# Patient Record
Sex: Male | Born: 1960
Health system: Southern US, Community
[De-identification: ages and names within clinical notes are randomized; demographics above are authoritative.]

## PROBLEM LIST (undated history)

## (undated) DIAGNOSIS — E785 Hyperlipidemia, unspecified: Secondary | ICD-10-CM

## (undated) DIAGNOSIS — M51369 Other intervertebral disc degeneration, lumbar region without mention of lumbar back pain or lower extremity pain: Secondary | ICD-10-CM

## (undated) DIAGNOSIS — I10 Essential (primary) hypertension: Secondary | ICD-10-CM

## (undated) DIAGNOSIS — L409 Psoriasis, unspecified: Secondary | ICD-10-CM

## (undated) DIAGNOSIS — Z9189 Other specified personal risk factors, not elsewhere classified: Secondary | ICD-10-CM

## (undated) DIAGNOSIS — K5730 Diverticulosis of large intestine without perforation or abscess without bleeding: Secondary | ICD-10-CM

## (undated) DIAGNOSIS — N400 Enlarged prostate without lower urinary tract symptoms: Secondary | ICD-10-CM

## (undated) DIAGNOSIS — K219 Gastro-esophageal reflux disease without esophagitis: Secondary | ICD-10-CM

## (undated) DIAGNOSIS — Z972 Presence of dental prosthetic device (complete) (partial): Secondary | ICD-10-CM

## (undated) DIAGNOSIS — F32A Depression, unspecified: Secondary | ICD-10-CM

## (undated) DIAGNOSIS — M5136 Other intervertebral disc degeneration, lumbar region: Secondary | ICD-10-CM

## (undated) DIAGNOSIS — Z860101 Personal history of adenomatous and serrated colon polyps: Secondary | ICD-10-CM

## (undated) DIAGNOSIS — M5030 Other cervical disc degeneration, unspecified cervical region: Secondary | ICD-10-CM

## (undated) DIAGNOSIS — Z598 Other problems related to housing and economic circumstances: Secondary | ICD-10-CM

## (undated) DIAGNOSIS — R7303 Prediabetes: Secondary | ICD-10-CM

## (undated) DIAGNOSIS — M47814 Spondylosis without myelopathy or radiculopathy, thoracic region: Secondary | ICD-10-CM

## (undated) DIAGNOSIS — IMO0001 Reserved for inherently not codable concepts without codable children: Secondary | ICD-10-CM

## (undated) DIAGNOSIS — R3915 Urgency of urination: Secondary | ICD-10-CM

## (undated) DIAGNOSIS — Z86010 Personal history of colonic polyps: Secondary | ICD-10-CM

## (undated) DIAGNOSIS — M8580 Other specified disorders of bone density and structure, unspecified site: Secondary | ICD-10-CM

## (undated) DIAGNOSIS — R350 Frequency of micturition: Secondary | ICD-10-CM

## (undated) DIAGNOSIS — C801 Malignant (primary) neoplasm, unspecified: Secondary | ICD-10-CM

## (undated) DIAGNOSIS — F419 Anxiety disorder, unspecified: Secondary | ICD-10-CM

## (undated) DIAGNOSIS — F79 Unspecified intellectual disabilities: Secondary | ICD-10-CM

## (undated) DIAGNOSIS — Z832 Family history of diseases of the blood and blood-forming organs and certain disorders involving the immune mechanism: Secondary | ICD-10-CM

## (undated) DIAGNOSIS — M1990 Unspecified osteoarthritis, unspecified site: Secondary | ICD-10-CM

## (undated) HISTORY — DX: Depression, unspecified: F32.A

## (undated) HISTORY — DX: Malignant (primary) neoplasm, unspecified: C80.1

## (undated) HISTORY — PX: COLONOSCOPY: SHX174

## (undated) HISTORY — DX: Other specified disorders of bone density and structure, unspecified site: M85.80

## (undated) HISTORY — DX: Psoriasis, unspecified: L40.9

## (undated) HISTORY — DX: Gastro-esophageal reflux disease without esophagitis: K21.9

---

## 2004-02-08 HISTORY — PX: CATARACT EXTRACTION W/ INTRAOCULAR LENS  IMPLANT, BILATERAL: SHX1307

## 2004-10-21 ENCOUNTER — Ambulatory Visit: Payer: Self-pay | Admitting: Internal Medicine

## 2004-10-27 ENCOUNTER — Encounter (INDEPENDENT_AMBULATORY_CARE_PROVIDER_SITE_OTHER): Payer: Self-pay | Admitting: Specialist

## 2004-10-27 ENCOUNTER — Ambulatory Visit: Payer: Self-pay | Admitting: Internal Medicine

## 2004-10-27 DIAGNOSIS — Z860100 Personal history of colon polyps, unspecified: Secondary | ICD-10-CM | POA: Insufficient documentation

## 2005-03-22 ENCOUNTER — Ambulatory Visit (HOSPITAL_COMMUNITY): Admission: RE | Admit: 2005-03-22 | Discharge: 2005-03-22 | Payer: Self-pay | Admitting: Family Medicine

## 2007-03-26 ENCOUNTER — Encounter (INDEPENDENT_AMBULATORY_CARE_PROVIDER_SITE_OTHER): Payer: Self-pay | Admitting: Urology

## 2007-03-26 ENCOUNTER — Ambulatory Visit (HOSPITAL_COMMUNITY): Admission: RE | Admit: 2007-03-26 | Discharge: 2007-03-26 | Payer: Self-pay | Admitting: Urology

## 2007-03-29 HISTORY — PX: CIRCUMCISION: SUR203

## 2009-09-22 ENCOUNTER — Encounter (INDEPENDENT_AMBULATORY_CARE_PROVIDER_SITE_OTHER): Payer: Self-pay | Admitting: *Deleted

## 2009-10-21 ENCOUNTER — Encounter (INDEPENDENT_AMBULATORY_CARE_PROVIDER_SITE_OTHER): Payer: Self-pay | Admitting: *Deleted

## 2010-03-05 ENCOUNTER — Ambulatory Visit
Admission: RE | Admit: 2010-03-05 | Discharge: 2010-03-05 | Payer: Self-pay | Source: Home / Self Care | Attending: Internal Medicine | Admitting: Internal Medicine

## 2010-03-05 ENCOUNTER — Encounter (INDEPENDENT_AMBULATORY_CARE_PROVIDER_SITE_OTHER): Payer: Self-pay | Admitting: *Deleted

## 2010-03-09 NOTE — Letter (Signed)
Summary: Pre Visit Letter Revised  [facility]  [address] [place], [place] [address]   Phone: [phone]  Fax: [phone]        10/21/2009 MRN: [id number] Arthur Weaver [address] [place], [place]  [address]             Procedure Date: 12-08-09   Welcome to the Gastroenterology Division at [facility]    You are scheduled to see a nurse for your pre-procedure visit on 11-24-09 at 10:30a.m. on the 3rd floor at [facility], [address]  We ask that you try to arrive at our office 15 minutes prior to your appointment time to allow for check-in.  Please take a minute to review the attached form.  If you answer "Yes" to one or more of the questions on the first page, we ask that you call the person listed at your earliest opportunity.  If you answer "No" to all of the questions, please complete the rest of the form and bring it to your appointment.    Your nurse visit will consist of discussing your medical and surgical history, your immediate family medical history, and your medications.   If you are unable to list all of your medications on the form, please bring the medication bottles to your appointment and we will list them.  We will need to be aware of both prescribed and over the counter drugs.  We will need to know exact dosage information as well.    Please be prepared to read and sign documents such as consent forms, a financial agreement, and acknowledgement forms.  If necessary, and with your consent, a friend or relative is welcome to sit-in on the nurse visit with you.  Please bring your insurance card so that we may make a copy of it.  If your insurance requires a referral to see a specialist, please bring your referral form from your primary care physician.  No co-pay is required for this nurse visit.     If you cannot keep your appointment, please call [phone] to cancel or reschedule prior to your appointment date.  This allows  [place] the opportunity to schedule an appointment for another patient in need of care.    Thank you for choosing [facility] for your medical needs.  We appreciate the opportunity to care for you.  Please visit [place] at our website  to learn more about our practice.  Sincerely, The Gastroenterology Division

## 2010-03-09 NOTE — Letter (Signed)
Summary: Colonoscopy Letter  [facility]  [address] [place], [place] [address]   Phone: [phone]  Fax: [phone]      September 22, 2009 MRN: [id number]   ISAIA HASSELL [address] [place], [place]  [address]   Dear Mr. [facility],   According to your medical record, it is time for you to schedule a Colonoscopy. The American Cancer Society recommends this procedure as a method to detect early colon cancer. Patients with a family history of colon cancer, or a personal history of colon polyps or inflammatory bowel disease are at increased risk.  This letter has beeen generated based on the recommendations made at the time of your procedure. If you feel that in your particular situation this may no longer apply, please contact our office.  Please call our office at [phone] to schedule this appointment or to update your records at your earliest convenience.  Thank you for cooperating with [place] to provide you with the very best care possible.   Sincerely,   Iva Boop, M.D.  [facility] [phone]

## 2010-03-11 NOTE — Miscellaneous (Signed)
Summary: previsit prep  Clinical Lists Changes  Medications: Added new medication of MOVIPREP 100 GM  SOLR (PEG-KCL-NACL-NASULF-NA ASC-C) As per prep instructions. - Signed Rx of MOVIPREP 100 GM  SOLR (PEG-KCL-NACL-NASULF-NA ASC-C) As per prep instructions.;  #1 x 0;  Signed;  Entered by: Sherren Kerns RN;  Authorized by: Iva Boop MD, FACG;  Method used: Samples Given Allergies: Added new allergy or adverse reaction of SULFA Observations: Added new observation of ALLERGY REV: Done (03/05/2010 13:04) Added new observation of NKA: F (03/05/2010 13:04)    Prescriptions: MOVIPREP 100 GM  SOLR (PEG-KCL-NACL-NASULF-NA ASC-C) As per prep instructions.  #1 x 0   Entered by:   Sherren Kerns RN   Authorized by:   Iva Boop MD, [facility]   Signed by:   Sherren Kerns RN on 03/05/2010   Method used:   Samples Given   RxID:   [id number]

## 2010-03-11 NOTE — Letter (Signed)
Summary: [facility] Instructions  [facility]  [address] [place], [place] [address]   Phone: [phone]  Fax: [phone]       Arthur Weaver    [birth year]    MRN: [id number]        Procedure Day /Date: Thursday 03-18-10     Arrival Time: 8:00 am     Procedure Time: 9:00 AM     Location of Procedure:                    [name] (4th Floor)   PREPARATION FOR COLONOSCOPY WITH MOVIPREP   Starting 5 days prior to your procedure  03-13-10 do not eat nuts, seeds, popcorn, corn, beans, peas,  salads, or any raw vegetables.  Do not take any fiber supplements (e.g. Metamucil, Citrucel, and Benefiber).  THE DAY BEFORE YOUR PROCEDURE         DATE:  03-17-10  DAY:  Wednesday   1.  Drink clear liquids the entire day-NO SOLID FOOD  2.  Do not drink anything colored red or purple.  Avoid juices with pulp.  No orange juice.  3.  Drink at least 64 oz. (8 glasses) of fluid/clear liquids during the day to prevent dehydration and help the prep work efficiently.  CLEAR LIQUIDS INCLUDE: Water Jello Ice Popsicles Tea (sugar ok, no milk/cream) Powdered fruit flavored drinks Coffee (sugar ok, no milk/cream) Gatorade Juice: apple, white grape, white cranberry  Lemonade Clear bullion, consomm, broth Carbonated beverages (any kind) Strained chicken noodle soup Hard Candy                             4.  In the morning, mix first dose of MoviPrep solution:    Empty 1 Pouch A and 1 Pouch B into the disposable container    Add lukewarm drinking water to the top line of the container. Mix to dissolve    Refrigerate (mixed solution should be used within 24 hrs)  5.  Begin drinking the prep at 5:00 p.m. The MoviPrep container is divided by 4 marks.   Every 15 minutes drink the solution down to the next mark (approximately 8 oz) until the full liter is complete.   6.  Follow completed prep with 16 oz of clear liquid of your choice (Nothing red or purple).   Continue to drink clear liquids until bedtime.  7.  Before going to bed, mix second dose of MoviPrep solution:    Empty 1 Pouch A and 1 Pouch B into the disposable container    Add lukewarm drinking water to the top line of the container. Mix to dissolve    Refrigerate  THE DAY OF YOUR PROCEDURE      DATE:  03-18-10  DAY: Thursday  Beginning at  4:00 a.m. (5 hours before procedure):         1. Every 15 minutes, drink the solution down to the next mark (approx 8 oz) until the full liter is complete.  2. Follow completed prep with 16 oz. of clear liquid of your choice.    3. You may drink clear liquids until  7:00 a.m. (2 HOURS BEFORE PROCEDURE).   MEDICATION INSTRUCTIONS  Unless otherwise instructed, you should take regular prescription medications with a small sip of water   as early as possible the morning of your procedure.           OTHER  INSTRUCTIONS  You will need a responsible adult at least 52 years of age to accompany you and drive you home.   This person must remain in the waiting room during your procedure.  Wear loose fitting clothing that is easily removed.  Leave jewelry and other valuables at home.  However, you may wish to bring a book to read or  an iPod/MP3 player to listen to music as you wait for your procedure to start.  Remove all body piercing jewelry and leave at home.  Total time from sign-in until discharge is approximately 2-3 hours.  You should go home directly after your procedure and rest.  You can resume normal activities the  day after your procedure.  The day of your procedure you should not:   Drive   Make legal decisions   Operate machinery   Drink alcohol   Return to work  You will receive specific instructions about eating, activities and medications before you leave.    The above instructions have been reviewed and explained to me by  Sherren Kerns RN  March 05, 2010 1:38 PM    I fully understand and can  verbalize these instructions _____________________________ Date _________

## 2010-03-18 ENCOUNTER — Other Ambulatory Visit (AMBULATORY_SURGERY_CENTER): Payer: Medicare PPO | Admitting: Internal Medicine

## 2010-03-18 ENCOUNTER — Other Ambulatory Visit: Payer: Self-pay | Admitting: Internal Medicine

## 2010-03-18 DIAGNOSIS — Z860100 Personal history of colon polyps, unspecified: Secondary | ICD-10-CM

## 2010-03-18 DIAGNOSIS — D126 Benign neoplasm of colon, unspecified: Secondary | ICD-10-CM

## 2010-03-18 DIAGNOSIS — Z1211 Encounter for screening for malignant neoplasm of colon: Secondary | ICD-10-CM

## 2010-03-18 DIAGNOSIS — K5730 Diverticulosis of large intestine without perforation or abscess without bleeding: Secondary | ICD-10-CM

## 2010-03-24 ENCOUNTER — Encounter: Payer: Self-pay | Admitting: Internal Medicine

## 2010-03-25 NOTE — Procedures (Addendum)
Summary: Colonoscopy  Patient: Arthur Weaver Note: All result statuses are Final unless otherwise noted.  Tests: (1) Colonoscopy (COL)   COL Colonoscopy           DONE     [facility]     [address]     [place], [place]  [address]           COLONOSCOPY PROCEDURE REPORT           PATIENT:  [name], [name]  MR#:  [id number]     BIRTHDATE:  02/13/1960, 49 yrs. old  GENDER:  male     ENDOSCOPIST:  Iva Boop, MD, [facility]           PROCEDURE DATE:  03/18/2010     PROCEDURE:  Colonoscopy with snare polypectomy     ASA CLASS:  Class I     INDICATIONS:  surveillance and high-risk screening, history of     pre-cancerous (adenomatous) colon polyps 8 mm adenoma removed in     2006     MEDICATIONS:   Fentanyl 50 mcg IV, Versed 7 mg IV           DESCRIPTION OF PROCEDURE:   After the risks benefits and     alternatives of the procedure were thoroughly explained, informed     consent was obtained.  Digital rectal exam was performed and     revealed no abnormalities and normal prostate.   The LB CF-H180AL     [id number] endoscope was introduced through the anus and advanced to     the cecum, which was identified by both the appendix and ileocecal     valve, without limitations.  The quality of the prep was     excellent, using MoviPrep.  The instrument was then slowly     withdrawn as the colon was fully examined. Insertion: 2:51 minutes     Withdrawal: 13:38 minutes     <<PROCEDUREIMAGES>>           FINDINGS:  Two polyps were found one in the cecum and one in the     splenic flexure.They were diminutive. Polyps were snared without     cautery. Retrieval was successful. Scattered diverticula were     found throughout the colon.  This was otherwise a normal     examination of the colon. Includes right colon retroflexion.     Retroflexed views in the rectum revealed internal and external     hemorrhoids.    The scope was then withdrawn from the patient and     the procedure  completed.           COMPLICATIONS:  None     ENDOSCOPIC IMPRESSION:     1) Two diminutive polyps removed     2) Diverticula, scattered throughout the colon     3) Internal and external hemorrhoids     4) Otherwise normal examination, excellent prep     5) Personal history of adenoma removal 2006           REPEAT EXAM:  In for Colonoscopy, pending biopsy results.           Iva Boop, MD, [facility]           CC:  Charlott Rakes, MD     The Patient           n.     eSIGNED:   Iva Boop at 03/18/2010 09:51 AM  Tarell, Sch[place], [id number]  Note: An exclamation mark (!) indicates a result that was not dispersed into the flowsheet. Document Creation Date: 03/18/2010 9:51 AM _______________________________________________________________________  (1) Order result status: Final Collection or observation date-time: 03/18/2010 09:39 Requested date-time:  Receipt date-time:  Reported date-time:  Referring Physician:   Ordering Physician: Stan Head [id number]) Specimen Source:  Source: Launa Grill Order Number: [id number] Lab site:   Appended Document: Colonoscopy     Procedures Next Due Date:    Colonoscopy: 03/2015

## 2010-03-31 NOTE — Letter (Signed)
Summary: Patient Notice- Polyp Results  [facility]  [address] [place], [place] [address]   Phone: [phone]  Fax: [phone]        March 24, 2010 MRN: [id number]    Arthur Weaver [address] [place], [place]  [address]    Dear Mr. Buist,  The polyps removed from your colon were adenomatous. This means that they were pre-cancerous or that  they had the potential to change into cancer over time.  I recommend that you have a repeat colonoscopy in 5 years to determine if you have developed any new polyps over time and screen for colorectal cancer. If you develop any new rectal bleeding, abdominal pain or significant bowel habit changes, please contact us before then.  In addition to repeating colonoscopy, changing health habits may reduce your risk of having more colon or rectal  polyps and possibly, colorectal cancer. You may lower your risk of future polyps and colorectal cancer by adopting healthy habits such as not smoking or using tobacco (if you do), being physically active, losing weight (if overweight), and eating a diet which includes fruits and vegetables and limits red meat.  Please call us if you are having persistent problems or have questions about your condition that have not been fully answered at this time.  Sincerely,  Iva Boop MD, [facility]  This letter has been electronically signed by your physician.  Appended Document: Patient Notice- Polyp Results letter mailed

## 2010-04-14 ENCOUNTER — Other Ambulatory Visit: Payer: Self-pay | Admitting: General Surgery

## 2010-06-22 NOTE — Op Note (Signed)
NAME:  Arthur Weaver, Arthur Weaver NO.:  [id number]   MEDICAL RECORD NO.:  [id number]          PATIENT TYPE:  AMB   LOCATION:  DAY                          FACILITY:  [facility]   PHYSICIAN:  Sigmund I. Patsi Sears, M.D.DATE OF BIRTH:  1960/09/16   DATE OF PROCEDURE:  03/26/2007  DATE OF DISCHARGE:                               OPERATIVE REPORT   PREOPERATIVE DIAGNOSES:  1. Prostate nodule.  2. Severe phimosis.   OPERATION:  1. Prostate ultrasound and prostate biopsy.  2. Circumcision.  3. Flexible cystoscopy.   SURGEON:  Sigmund I. Patsi Sears, M.D.   ANESTHESIA:  General, LMA anesthesia.   PREPARATION/DESCRIPTION OF PROCEDURE:  After appropriate per-anesthesia,  the patient was brought to the operating room and placed on the  operating room table in the dorsal supine position where general LMA  anesthesia was introduced.  He was then placed in the left lateral  decubitus position where the prostate ultrasound probe was placed.  The  prostate ultrasound to 23.5 mL.  The patient's PSA is 0.1.  He has known  prostate nodule, for biopsy.   A rectal examination again revealed a firm gritty prostate with  bilateral nodules.  Multiple cores of tissue were taken from the  prostate without difficulty and no bleeding was noted.  The patient was  replaced in the dorsal supine position where the penis was prepped with  Betadine solution and draped in the usual fashion.  Because of severe  phimosis, the urethral meatus cannot be identified, and the foreskin  cannot be brought behind the glans.  Therefore a dorsal slit  circumcision is accomplished using scissors and cutting in the 12  o'clock position.  Circumferential incisions were then made in the  periglandular and pericoronal areas, after marking them with a marking  pen.  Marcaine 0.5% plain is used circumferentially at the base of the  penis, under the pubis, to afford a penile block.   Following this, the foreskin is removed  and subcutaneous tissue  dissected with the electrosurgical unit.  Four separate quadrant sutures  of #4-0 Vicryl suture were then accomplished, and each quadrant was  closed with interrupted #4-0 Vicryl suture.  Sterile dressing was  applied.  A cystoscopy reveals normal-appearing bladder with  slightly elevated bladder neck and prostatic hypertrophy.  There was  trabeculation noted but cellules were identified.  There was no  diverticula or stones identified or bladder tumor seen.  The bladder was  drained with a #14 red Robinson catheter.  The patient was given IV  Toradol, awakened and taken to the recovery room in good condition.      Sigmund I. Patsi Sears, M.D.  Electronically Signed     SIT/MEDQ  D:  03/26/2007  T:  03/27/2007  Job:  [id number]

## 2010-09-07 ENCOUNTER — Encounter: Payer: Self-pay | Admitting: Podiatry

## 2010-09-07 DIAGNOSIS — E119 Type 2 diabetes mellitus without complications: Secondary | ICD-10-CM | POA: Insufficient documentation

## 2010-09-07 DIAGNOSIS — M7989 Other specified soft tissue disorders: Secondary | ICD-10-CM | POA: Insufficient documentation

## 2010-09-07 DIAGNOSIS — M069 Rheumatoid arthritis, unspecified: Secondary | ICD-10-CM | POA: Insufficient documentation

## 2010-09-07 DIAGNOSIS — I10 Essential (primary) hypertension: Secondary | ICD-10-CM | POA: Insufficient documentation

## 2010-09-07 DIAGNOSIS — E7800 Pure hypercholesterolemia, unspecified: Secondary | ICD-10-CM | POA: Insufficient documentation

## 2010-10-29 LAB — BASIC METABOLIC PANEL
Chloride: 103
GFR calc Af Amer: >60
GFR calc non Af Amer: >60
Potassium: 3.8

## 2010-10-29 LAB — URINALYSIS, ROUTINE W REFLEX MICROSCOPIC
Glucose, UA: NEGATIVE
Nitrite: NEGATIVE

## 2010-10-29 LAB — HEMOGLOBIN AND HEMATOCRIT, BLOOD
HCT: 39.9
Hemoglobin: 14

## 2012-12-26 ENCOUNTER — Ambulatory Visit (INDEPENDENT_AMBULATORY_CARE_PROVIDER_SITE_OTHER): Payer: Medicare PPO | Admitting: Podiatry

## 2012-12-26 ENCOUNTER — Encounter: Payer: Self-pay | Admitting: Podiatry

## 2012-12-26 VITALS — BP 159/88 | HR 84 | Resp 16

## 2012-12-26 DIAGNOSIS — B351 Tinea unguium: Secondary | ICD-10-CM

## 2012-12-26 DIAGNOSIS — M79609 Pain in unspecified limb: Secondary | ICD-10-CM

## 2012-12-26 DIAGNOSIS — Q828 Other specified congenital malformations of skin: Secondary | ICD-10-CM

## 2012-12-26 NOTE — Progress Notes (Signed)
Subjective:     Patient ID: Arthur Weaver, male   DOB: [birth year], 52 y.o.   MRN: [id number]  HPI patient presents with caregiver complaining of thick painful  nailbeds 1-5 both feet and calluses on both feet which are painful and he cannot take care of himself   Review of Systems     Objective:   Physical Exam Neurovascular status unchanged with significant thickness of nailbeds 1-5 and pain and keratotic lesions on the right and left hallux second and third toe both feet that are thick with nucleated tight quarters    Assessment:     Diabetic with nail disease and lesions with at risk condition    Plan:     Debridement of nailbeds 1-5 both feet and debridement of lesions on both feet with no iatrogenic bleeding noted

## 2013-03-18 ENCOUNTER — Ambulatory Visit: Payer: Medicare PPO | Admitting: Podiatry

## 2013-04-04 ENCOUNTER — Ambulatory Visit: Payer: Medicare PPO | Admitting: Podiatry

## 2013-04-29 ENCOUNTER — Encounter: Payer: Self-pay | Admitting: Podiatry

## 2013-04-29 ENCOUNTER — Ambulatory Visit (INDEPENDENT_AMBULATORY_CARE_PROVIDER_SITE_OTHER): Payer: Medicare PPO | Admitting: Podiatry

## 2013-04-29 VITALS — BP 131/86 | HR 100 | Resp 12

## 2013-04-29 DIAGNOSIS — M79609 Pain in unspecified limb: Secondary | ICD-10-CM

## 2013-04-29 DIAGNOSIS — B351 Tinea unguium: Secondary | ICD-10-CM

## 2013-04-29 DIAGNOSIS — L84 Corns and callosities: Secondary | ICD-10-CM

## 2013-04-30 NOTE — Progress Notes (Signed)
Subjective:     Patient ID: Arthur Weaver, male   DOB: Aug 14, 1960, 53 y.o.   MRN: [id number]  HPI patient presents with caregiver with nail disease 1-5 both feet that are painful and cannot be cut by the patient and lesion on the right foot and painful   Review of Systems     Objective:   Physical Exam Neurovascular status unchanged with thick painful nailbeds 1-5 both feet and keratotic lesion distal third toe right it's painful    Assessment:     Mycotic nail infection with pain and lesion formation right third toe    Plan:     Debridement painful nailbeds 1-5 both feet and lesion on the right foot

## 2013-05-28 DIAGNOSIS — E559 Vitamin D deficiency, unspecified: Secondary | ICD-10-CM | POA: Insufficient documentation

## 2013-05-28 DIAGNOSIS — K219 Gastro-esophageal reflux disease without esophagitis: Secondary | ICD-10-CM | POA: Insufficient documentation

## 2013-06-03 ENCOUNTER — Telehealth: Payer: Self-pay | Admitting: Internal Medicine

## 2013-06-03 NOTE — Telephone Encounter (Signed)
Rec'd from [place] forward 2 pages to Dr.Gessner

## 2013-08-01 ENCOUNTER — Ambulatory Visit (INDEPENDENT_AMBULATORY_CARE_PROVIDER_SITE_OTHER): Payer: Medicare PPO | Admitting: Podiatry

## 2013-08-01 ENCOUNTER — Encounter: Payer: Self-pay | Admitting: Podiatry

## 2013-08-01 DIAGNOSIS — B351 Tinea unguium: Secondary | ICD-10-CM

## 2013-08-01 DIAGNOSIS — L84 Corns and callosities: Secondary | ICD-10-CM

## 2013-08-01 DIAGNOSIS — M79673 Pain in unspecified foot: Secondary | ICD-10-CM

## 2013-08-01 DIAGNOSIS — M79609 Pain in unspecified limb: Secondary | ICD-10-CM

## 2013-08-01 NOTE — Progress Notes (Signed)
Subjective:     Patient ID: Arthur Weaver, male   DOB: [birth year], 53 y.o.   MRN: [id number]  HPI patient presents with thick nailbeds 1-5 both feet that he cannot cut and lesions on both feet that become tender when pressed   Review of Systems     Objective:   Physical Exam Neurovascular status intact with thick brittle nailbeds 1-5 both feet that are painful and he cannot cut and keratotic lesions on the plantar aspect of both first metatarsal    Assessment:     Nail disease with mycosis 1-5 both feet and lesions of both feet    Plan:     Debridement nailbeds 1-5 both feet with no iatrogenic bleeding and debridement lesions on both feet with no bleeding

## 2014-02-07 DIAGNOSIS — F71 Moderate intellectual disabilities: Secondary | ICD-10-CM | POA: Diagnosis not present

## 2014-02-08 DIAGNOSIS — F71 Moderate intellectual disabilities: Secondary | ICD-10-CM | POA: Diagnosis not present

## 2014-02-09 DIAGNOSIS — F71 Moderate intellectual disabilities: Secondary | ICD-10-CM | POA: Diagnosis not present

## 2014-02-10 DIAGNOSIS — F71 Moderate intellectual disabilities: Secondary | ICD-10-CM | POA: Diagnosis not present

## 2014-02-11 DIAGNOSIS — F71 Moderate intellectual disabilities: Secondary | ICD-10-CM | POA: Diagnosis not present

## 2014-02-12 DIAGNOSIS — F71 Moderate intellectual disabilities: Secondary | ICD-10-CM | POA: Diagnosis not present

## 2014-02-12 DIAGNOSIS — H26493 Other secondary cataract, bilateral: Secondary | ICD-10-CM | POA: Diagnosis not present

## 2014-02-13 DIAGNOSIS — F71 Moderate intellectual disabilities: Secondary | ICD-10-CM | POA: Diagnosis not present

## 2014-02-14 DIAGNOSIS — F71 Moderate intellectual disabilities: Secondary | ICD-10-CM | POA: Diagnosis not present

## 2014-02-15 DIAGNOSIS — F71 Moderate intellectual disabilities: Secondary | ICD-10-CM | POA: Diagnosis not present

## 2014-02-16 DIAGNOSIS — F71 Moderate intellectual disabilities: Secondary | ICD-10-CM | POA: Diagnosis not present

## 2014-02-17 DIAGNOSIS — F71 Moderate intellectual disabilities: Secondary | ICD-10-CM | POA: Diagnosis not present

## 2014-02-18 DIAGNOSIS — F71 Moderate intellectual disabilities: Secondary | ICD-10-CM | POA: Diagnosis not present

## 2014-02-19 DIAGNOSIS — F71 Moderate intellectual disabilities: Secondary | ICD-10-CM | POA: Diagnosis not present

## 2014-02-20 DIAGNOSIS — F71 Moderate intellectual disabilities: Secondary | ICD-10-CM | POA: Diagnosis not present

## 2014-02-21 DIAGNOSIS — F71 Moderate intellectual disabilities: Secondary | ICD-10-CM | POA: Diagnosis not present

## 2014-02-22 DIAGNOSIS — F71 Moderate intellectual disabilities: Secondary | ICD-10-CM | POA: Diagnosis not present

## 2014-02-23 DIAGNOSIS — F71 Moderate intellectual disabilities: Secondary | ICD-10-CM | POA: Diagnosis not present

## 2014-02-24 DIAGNOSIS — F71 Moderate intellectual disabilities: Secondary | ICD-10-CM | POA: Diagnosis not present

## 2014-02-25 DIAGNOSIS — F71 Moderate intellectual disabilities: Secondary | ICD-10-CM | POA: Diagnosis not present

## 2014-02-26 DIAGNOSIS — F71 Moderate intellectual disabilities: Secondary | ICD-10-CM | POA: Diagnosis not present

## 2014-02-27 DIAGNOSIS — F71 Moderate intellectual disabilities: Secondary | ICD-10-CM | POA: Diagnosis not present

## 2014-02-28 DIAGNOSIS — F71 Moderate intellectual disabilities: Secondary | ICD-10-CM | POA: Diagnosis not present

## 2014-03-01 DIAGNOSIS — F71 Moderate intellectual disabilities: Secondary | ICD-10-CM | POA: Diagnosis not present

## 2014-03-02 DIAGNOSIS — F71 Moderate intellectual disabilities: Secondary | ICD-10-CM | POA: Diagnosis not present

## 2014-03-03 DIAGNOSIS — F71 Moderate intellectual disabilities: Secondary | ICD-10-CM | POA: Diagnosis not present

## 2014-03-04 DIAGNOSIS — F71 Moderate intellectual disabilities: Secondary | ICD-10-CM | POA: Diagnosis not present

## 2014-03-05 DIAGNOSIS — F71 Moderate intellectual disabilities: Secondary | ICD-10-CM | POA: Diagnosis not present

## 2014-03-06 DIAGNOSIS — F71 Moderate intellectual disabilities: Secondary | ICD-10-CM | POA: Diagnosis not present

## 2014-03-07 DIAGNOSIS — F71 Moderate intellectual disabilities: Secondary | ICD-10-CM | POA: Diagnosis not present

## 2014-03-08 DIAGNOSIS — F71 Moderate intellectual disabilities: Secondary | ICD-10-CM | POA: Diagnosis not present

## 2014-03-09 DIAGNOSIS — F71 Moderate intellectual disabilities: Secondary | ICD-10-CM | POA: Diagnosis not present

## 2014-03-10 DIAGNOSIS — F71 Moderate intellectual disabilities: Secondary | ICD-10-CM | POA: Diagnosis not present

## 2014-03-11 DIAGNOSIS — F71 Moderate intellectual disabilities: Secondary | ICD-10-CM | POA: Diagnosis not present

## 2014-03-12 DIAGNOSIS — F71 Moderate intellectual disabilities: Secondary | ICD-10-CM | POA: Diagnosis not present

## 2014-03-13 DIAGNOSIS — F71 Moderate intellectual disabilities: Secondary | ICD-10-CM | POA: Diagnosis not present

## 2014-03-14 DIAGNOSIS — F71 Moderate intellectual disabilities: Secondary | ICD-10-CM | POA: Diagnosis not present

## 2014-03-15 DIAGNOSIS — F71 Moderate intellectual disabilities: Secondary | ICD-10-CM | POA: Diagnosis not present

## 2014-03-16 DIAGNOSIS — F71 Moderate intellectual disabilities: Secondary | ICD-10-CM | POA: Diagnosis not present

## 2014-03-17 DIAGNOSIS — F71 Moderate intellectual disabilities: Secondary | ICD-10-CM | POA: Diagnosis not present

## 2014-03-18 DIAGNOSIS — F71 Moderate intellectual disabilities: Secondary | ICD-10-CM | POA: Diagnosis not present

## 2014-03-19 DIAGNOSIS — F71 Moderate intellectual disabilities: Secondary | ICD-10-CM | POA: Diagnosis not present

## 2014-03-20 DIAGNOSIS — F71 Moderate intellectual disabilities: Secondary | ICD-10-CM | POA: Diagnosis not present

## 2014-03-21 DIAGNOSIS — F71 Moderate intellectual disabilities: Secondary | ICD-10-CM | POA: Diagnosis not present

## 2014-03-22 DIAGNOSIS — F71 Moderate intellectual disabilities: Secondary | ICD-10-CM | POA: Diagnosis not present

## 2014-03-23 DIAGNOSIS — F71 Moderate intellectual disabilities: Secondary | ICD-10-CM | POA: Diagnosis not present

## 2014-03-24 DIAGNOSIS — F71 Moderate intellectual disabilities: Secondary | ICD-10-CM | POA: Diagnosis not present

## 2014-03-25 DIAGNOSIS — F71 Moderate intellectual disabilities: Secondary | ICD-10-CM | POA: Diagnosis not present

## 2014-03-26 DIAGNOSIS — F71 Moderate intellectual disabilities: Secondary | ICD-10-CM | POA: Diagnosis not present

## 2014-03-27 DIAGNOSIS — F71 Moderate intellectual disabilities: Secondary | ICD-10-CM | POA: Diagnosis not present

## 2014-03-28 DIAGNOSIS — F71 Moderate intellectual disabilities: Secondary | ICD-10-CM | POA: Diagnosis not present

## 2014-03-29 DIAGNOSIS — F71 Moderate intellectual disabilities: Secondary | ICD-10-CM | POA: Diagnosis not present

## 2014-03-30 DIAGNOSIS — F71 Moderate intellectual disabilities: Secondary | ICD-10-CM | POA: Diagnosis not present

## 2014-03-31 DIAGNOSIS — F71 Moderate intellectual disabilities: Secondary | ICD-10-CM | POA: Diagnosis not present

## 2014-04-01 DIAGNOSIS — F71 Moderate intellectual disabilities: Secondary | ICD-10-CM | POA: Diagnosis not present

## 2014-04-02 DIAGNOSIS — F71 Moderate intellectual disabilities: Secondary | ICD-10-CM | POA: Diagnosis not present

## 2014-04-03 DIAGNOSIS — F71 Moderate intellectual disabilities: Secondary | ICD-10-CM | POA: Diagnosis not present

## 2014-04-04 DIAGNOSIS — N429 Disorder of prostate, unspecified: Secondary | ICD-10-CM | POA: Diagnosis not present

## 2014-04-04 DIAGNOSIS — F71 Moderate intellectual disabilities: Secondary | ICD-10-CM | POA: Diagnosis not present

## 2014-04-04 DIAGNOSIS — Z79899 Other long term (current) drug therapy: Secondary | ICD-10-CM | POA: Diagnosis not present

## 2014-04-05 DIAGNOSIS — F71 Moderate intellectual disabilities: Secondary | ICD-10-CM | POA: Diagnosis not present

## 2014-04-06 DIAGNOSIS — F71 Moderate intellectual disabilities: Secondary | ICD-10-CM | POA: Diagnosis not present

## 2014-04-07 DIAGNOSIS — F71 Moderate intellectual disabilities: Secondary | ICD-10-CM | POA: Diagnosis not present

## 2014-04-08 DIAGNOSIS — F71 Moderate intellectual disabilities: Secondary | ICD-10-CM | POA: Diagnosis not present

## 2014-04-09 DIAGNOSIS — F71 Moderate intellectual disabilities: Secondary | ICD-10-CM | POA: Diagnosis not present

## 2014-04-10 DIAGNOSIS — F71 Moderate intellectual disabilities: Secondary | ICD-10-CM | POA: Diagnosis not present

## 2014-04-11 DIAGNOSIS — F71 Moderate intellectual disabilities: Secondary | ICD-10-CM | POA: Diagnosis not present

## 2014-04-12 DIAGNOSIS — F71 Moderate intellectual disabilities: Secondary | ICD-10-CM | POA: Diagnosis not present

## 2014-04-13 DIAGNOSIS — F71 Moderate intellectual disabilities: Secondary | ICD-10-CM | POA: Diagnosis not present

## 2014-04-14 DIAGNOSIS — F71 Moderate intellectual disabilities: Secondary | ICD-10-CM | POA: Diagnosis not present

## 2014-04-15 DIAGNOSIS — F71 Moderate intellectual disabilities: Secondary | ICD-10-CM | POA: Diagnosis not present

## 2014-04-16 DIAGNOSIS — F71 Moderate intellectual disabilities: Secondary | ICD-10-CM | POA: Diagnosis not present

## 2014-04-17 DIAGNOSIS — F71 Moderate intellectual disabilities: Secondary | ICD-10-CM | POA: Diagnosis not present

## 2014-04-18 DIAGNOSIS — F71 Moderate intellectual disabilities: Secondary | ICD-10-CM | POA: Diagnosis not present

## 2014-04-19 DIAGNOSIS — F71 Moderate intellectual disabilities: Secondary | ICD-10-CM | POA: Diagnosis not present

## 2014-04-20 DIAGNOSIS — F71 Moderate intellectual disabilities: Secondary | ICD-10-CM | POA: Diagnosis not present

## 2014-04-21 DIAGNOSIS — F71 Moderate intellectual disabilities: Secondary | ICD-10-CM | POA: Diagnosis not present

## 2014-04-22 DIAGNOSIS — F71 Moderate intellectual disabilities: Secondary | ICD-10-CM | POA: Diagnosis not present

## 2014-04-23 DIAGNOSIS — F71 Moderate intellectual disabilities: Secondary | ICD-10-CM | POA: Diagnosis not present

## 2014-04-24 DIAGNOSIS — F71 Moderate intellectual disabilities: Secondary | ICD-10-CM | POA: Diagnosis not present

## 2014-04-25 DIAGNOSIS — F71 Moderate intellectual disabilities: Secondary | ICD-10-CM | POA: Diagnosis not present

## 2014-04-26 DIAGNOSIS — F71 Moderate intellectual disabilities: Secondary | ICD-10-CM | POA: Diagnosis not present

## 2014-04-27 DIAGNOSIS — F71 Moderate intellectual disabilities: Secondary | ICD-10-CM | POA: Diagnosis not present

## 2014-04-28 DIAGNOSIS — F71 Moderate intellectual disabilities: Secondary | ICD-10-CM | POA: Diagnosis not present

## 2014-04-29 DIAGNOSIS — F71 Moderate intellectual disabilities: Secondary | ICD-10-CM | POA: Diagnosis not present

## 2014-04-30 DIAGNOSIS — F71 Moderate intellectual disabilities: Secondary | ICD-10-CM | POA: Diagnosis not present

## 2014-05-01 DIAGNOSIS — Z79899 Other long term (current) drug therapy: Secondary | ICD-10-CM | POA: Diagnosis not present

## 2014-05-01 DIAGNOSIS — I10 Essential (primary) hypertension: Secondary | ICD-10-CM | POA: Diagnosis not present

## 2014-05-01 DIAGNOSIS — F71 Moderate intellectual disabilities: Secondary | ICD-10-CM | POA: Diagnosis not present

## 2014-05-01 DIAGNOSIS — F79 Unspecified intellectual disabilities: Secondary | ICD-10-CM | POA: Diagnosis not present

## 2014-05-02 DIAGNOSIS — F71 Moderate intellectual disabilities: Secondary | ICD-10-CM | POA: Diagnosis not present

## 2014-05-03 DIAGNOSIS — F71 Moderate intellectual disabilities: Secondary | ICD-10-CM | POA: Diagnosis not present

## 2014-05-04 DIAGNOSIS — F71 Moderate intellectual disabilities: Secondary | ICD-10-CM | POA: Diagnosis not present

## 2014-05-05 DIAGNOSIS — F71 Moderate intellectual disabilities: Secondary | ICD-10-CM | POA: Diagnosis not present

## 2014-05-06 DIAGNOSIS — F71 Moderate intellectual disabilities: Secondary | ICD-10-CM | POA: Diagnosis not present

## 2014-05-07 DIAGNOSIS — F71 Moderate intellectual disabilities: Secondary | ICD-10-CM | POA: Diagnosis not present

## 2014-05-08 DIAGNOSIS — F71 Moderate intellectual disabilities: Secondary | ICD-10-CM | POA: Diagnosis not present

## 2014-05-24 DIAGNOSIS — F71 Moderate intellectual disabilities: Secondary | ICD-10-CM | POA: Diagnosis not present

## 2014-05-25 DIAGNOSIS — F71 Moderate intellectual disabilities: Secondary | ICD-10-CM | POA: Diagnosis not present

## 2014-05-26 DIAGNOSIS — F71 Moderate intellectual disabilities: Secondary | ICD-10-CM | POA: Diagnosis not present

## 2014-05-27 DIAGNOSIS — F71 Moderate intellectual disabilities: Secondary | ICD-10-CM | POA: Diagnosis not present

## 2014-05-28 DIAGNOSIS — F71 Moderate intellectual disabilities: Secondary | ICD-10-CM | POA: Diagnosis not present

## 2014-05-29 DIAGNOSIS — F71 Moderate intellectual disabilities: Secondary | ICD-10-CM | POA: Diagnosis not present

## 2014-05-30 DIAGNOSIS — F71 Moderate intellectual disabilities: Secondary | ICD-10-CM | POA: Diagnosis not present

## 2014-05-31 DIAGNOSIS — F71 Moderate intellectual disabilities: Secondary | ICD-10-CM | POA: Diagnosis not present

## 2014-06-01 DIAGNOSIS — F71 Moderate intellectual disabilities: Secondary | ICD-10-CM | POA: Diagnosis not present

## 2014-06-02 ENCOUNTER — Ambulatory Visit (INDEPENDENT_AMBULATORY_CARE_PROVIDER_SITE_OTHER): Payer: Commercial Managed Care - HMO | Admitting: Podiatry

## 2014-06-02 ENCOUNTER — Encounter: Payer: Self-pay | Admitting: Podiatry

## 2014-06-02 VITALS — BP 146/94 | HR 84 | Resp 18

## 2014-06-02 DIAGNOSIS — B351 Tinea unguium: Secondary | ICD-10-CM

## 2014-06-02 DIAGNOSIS — Q828 Other specified congenital malformations of skin: Secondary | ICD-10-CM | POA: Diagnosis not present

## 2014-06-02 DIAGNOSIS — E1151 Type 2 diabetes mellitus with diabetic peripheral angiopathy without gangrene: Secondary | ICD-10-CM | POA: Diagnosis not present

## 2014-06-02 DIAGNOSIS — F71 Moderate intellectual disabilities: Secondary | ICD-10-CM | POA: Diagnosis not present

## 2014-06-02 DIAGNOSIS — M79673 Pain in unspecified foot: Secondary | ICD-10-CM | POA: Diagnosis not present

## 2014-06-03 DIAGNOSIS — F71 Moderate intellectual disabilities: Secondary | ICD-10-CM | POA: Diagnosis not present

## 2014-06-03 NOTE — Progress Notes (Signed)
Subjective:     Patient ID: Arthur Weaver, male   DOB: 15-Feb-1960, 54 y.o.   MRN: [id number]  HPI patient presents with lesions on both feet the become painful nail disease and long-term diabetes with obesity and diminished circulatory and neurological status   Review of Systems     Objective:   Physical Exam Neurovascular status diminished but intact with diminished pulses PT DP diminished hair growth shiny type skin along with lesions on the hallux bilateral that are very painful when pressed and making it hard to walk and nail disease 1-5 both feet with thickness yellow debris and pain    Assessment:     I chronic nail infection bilateral with lesion formation and at risk diabetes    Plan:     Reviewed condition and at this time debrided nailbeds 1-5 both feet debrided lesions on both feet and we will go ahead and have him approved for new pair diabetic shoes due to his risk conditions

## 2014-06-04 DIAGNOSIS — F71 Moderate intellectual disabilities: Secondary | ICD-10-CM | POA: Diagnosis not present

## 2014-06-05 DIAGNOSIS — F71 Moderate intellectual disabilities: Secondary | ICD-10-CM | POA: Diagnosis not present

## 2014-06-06 DIAGNOSIS — F71 Moderate intellectual disabilities: Secondary | ICD-10-CM | POA: Diagnosis not present

## 2014-06-07 DIAGNOSIS — F71 Moderate intellectual disabilities: Secondary | ICD-10-CM | POA: Diagnosis not present

## 2014-06-08 DIAGNOSIS — F71 Moderate intellectual disabilities: Secondary | ICD-10-CM | POA: Diagnosis not present

## 2014-06-09 DIAGNOSIS — F71 Moderate intellectual disabilities: Secondary | ICD-10-CM | POA: Diagnosis not present

## 2014-06-10 DIAGNOSIS — F71 Moderate intellectual disabilities: Secondary | ICD-10-CM | POA: Diagnosis not present

## 2014-06-11 DIAGNOSIS — F71 Moderate intellectual disabilities: Secondary | ICD-10-CM | POA: Diagnosis not present

## 2014-06-12 DIAGNOSIS — F71 Moderate intellectual disabilities: Secondary | ICD-10-CM | POA: Diagnosis not present

## 2014-06-13 DIAGNOSIS — F71 Moderate intellectual disabilities: Secondary | ICD-10-CM | POA: Diagnosis not present

## 2014-06-14 DIAGNOSIS — F71 Moderate intellectual disabilities: Secondary | ICD-10-CM | POA: Diagnosis not present

## 2014-06-15 DIAGNOSIS — F71 Moderate intellectual disabilities: Secondary | ICD-10-CM | POA: Diagnosis not present

## 2014-06-16 DIAGNOSIS — F71 Moderate intellectual disabilities: Secondary | ICD-10-CM | POA: Diagnosis not present

## 2014-06-17 DIAGNOSIS — F71 Moderate intellectual disabilities: Secondary | ICD-10-CM | POA: Diagnosis not present

## 2014-06-18 DIAGNOSIS — F71 Moderate intellectual disabilities: Secondary | ICD-10-CM | POA: Diagnosis not present

## 2014-06-19 DIAGNOSIS — F71 Moderate intellectual disabilities: Secondary | ICD-10-CM | POA: Diagnosis not present

## 2014-06-20 DIAGNOSIS — F71 Moderate intellectual disabilities: Secondary | ICD-10-CM | POA: Diagnosis not present

## 2014-06-21 DIAGNOSIS — F71 Moderate intellectual disabilities: Secondary | ICD-10-CM | POA: Diagnosis not present

## 2014-06-22 DIAGNOSIS — F71 Moderate intellectual disabilities: Secondary | ICD-10-CM | POA: Diagnosis not present

## 2014-06-23 DIAGNOSIS — F71 Moderate intellectual disabilities: Secondary | ICD-10-CM | POA: Diagnosis not present

## 2014-06-24 DIAGNOSIS — F71 Moderate intellectual disabilities: Secondary | ICD-10-CM | POA: Diagnosis not present

## 2014-06-25 DIAGNOSIS — F71 Moderate intellectual disabilities: Secondary | ICD-10-CM | POA: Diagnosis not present

## 2014-06-26 DIAGNOSIS — F71 Moderate intellectual disabilities: Secondary | ICD-10-CM | POA: Diagnosis not present

## 2014-06-27 DIAGNOSIS — F71 Moderate intellectual disabilities: Secondary | ICD-10-CM | POA: Diagnosis not present

## 2014-06-28 DIAGNOSIS — F71 Moderate intellectual disabilities: Secondary | ICD-10-CM | POA: Diagnosis not present

## 2014-06-29 DIAGNOSIS — F71 Moderate intellectual disabilities: Secondary | ICD-10-CM | POA: Diagnosis not present

## 2014-06-30 DIAGNOSIS — F71 Moderate intellectual disabilities: Secondary | ICD-10-CM | POA: Diagnosis not present

## 2014-07-01 DIAGNOSIS — F71 Moderate intellectual disabilities: Secondary | ICD-10-CM | POA: Diagnosis not present

## 2014-07-02 DIAGNOSIS — F71 Moderate intellectual disabilities: Secondary | ICD-10-CM | POA: Diagnosis not present

## 2014-07-03 DIAGNOSIS — F71 Moderate intellectual disabilities: Secondary | ICD-10-CM | POA: Diagnosis not present

## 2014-07-04 DIAGNOSIS — F71 Moderate intellectual disabilities: Secondary | ICD-10-CM | POA: Diagnosis not present

## 2014-07-05 DIAGNOSIS — F71 Moderate intellectual disabilities: Secondary | ICD-10-CM | POA: Diagnosis not present

## 2014-07-06 DIAGNOSIS — F71 Moderate intellectual disabilities: Secondary | ICD-10-CM | POA: Diagnosis not present

## 2014-07-07 DIAGNOSIS — F71 Moderate intellectual disabilities: Secondary | ICD-10-CM | POA: Diagnosis not present

## 2014-07-08 DIAGNOSIS — F71 Moderate intellectual disabilities: Secondary | ICD-10-CM | POA: Diagnosis not present

## 2014-07-09 DIAGNOSIS — F71 Moderate intellectual disabilities: Secondary | ICD-10-CM | POA: Diagnosis not present

## 2014-07-10 DIAGNOSIS — F71 Moderate intellectual disabilities: Secondary | ICD-10-CM | POA: Diagnosis not present

## 2014-07-11 DIAGNOSIS — F71 Moderate intellectual disabilities: Secondary | ICD-10-CM | POA: Diagnosis not present

## 2014-07-12 DIAGNOSIS — F71 Moderate intellectual disabilities: Secondary | ICD-10-CM | POA: Diagnosis not present

## 2014-07-13 DIAGNOSIS — F71 Moderate intellectual disabilities: Secondary | ICD-10-CM | POA: Diagnosis not present

## 2014-07-14 DIAGNOSIS — F71 Moderate intellectual disabilities: Secondary | ICD-10-CM | POA: Diagnosis not present

## 2014-07-15 DIAGNOSIS — F71 Moderate intellectual disabilities: Secondary | ICD-10-CM | POA: Diagnosis not present

## 2014-07-16 DIAGNOSIS — F71 Moderate intellectual disabilities: Secondary | ICD-10-CM | POA: Diagnosis not present

## 2014-07-17 DIAGNOSIS — F71 Moderate intellectual disabilities: Secondary | ICD-10-CM | POA: Diagnosis not present

## 2014-07-18 DIAGNOSIS — F71 Moderate intellectual disabilities: Secondary | ICD-10-CM | POA: Diagnosis not present

## 2014-07-19 DIAGNOSIS — F71 Moderate intellectual disabilities: Secondary | ICD-10-CM | POA: Diagnosis not present

## 2014-07-20 DIAGNOSIS — F71 Moderate intellectual disabilities: Secondary | ICD-10-CM | POA: Diagnosis not present

## 2014-07-21 DIAGNOSIS — F71 Moderate intellectual disabilities: Secondary | ICD-10-CM | POA: Diagnosis not present

## 2014-07-22 DIAGNOSIS — F71 Moderate intellectual disabilities: Secondary | ICD-10-CM | POA: Diagnosis not present

## 2014-07-23 DIAGNOSIS — F71 Moderate intellectual disabilities: Secondary | ICD-10-CM | POA: Diagnosis not present

## 2014-07-24 DIAGNOSIS — F71 Moderate intellectual disabilities: Secondary | ICD-10-CM | POA: Diagnosis not present

## 2014-07-25 DIAGNOSIS — F71 Moderate intellectual disabilities: Secondary | ICD-10-CM | POA: Diagnosis not present

## 2014-07-26 DIAGNOSIS — F71 Moderate intellectual disabilities: Secondary | ICD-10-CM | POA: Diagnosis not present

## 2014-07-27 DIAGNOSIS — F71 Moderate intellectual disabilities: Secondary | ICD-10-CM | POA: Diagnosis not present

## 2014-07-28 DIAGNOSIS — F71 Moderate intellectual disabilities: Secondary | ICD-10-CM | POA: Diagnosis not present

## 2014-07-29 DIAGNOSIS — F71 Moderate intellectual disabilities: Secondary | ICD-10-CM | POA: Diagnosis not present

## 2014-07-30 DIAGNOSIS — E559 Vitamin D deficiency, unspecified: Secondary | ICD-10-CM | POA: Diagnosis not present

## 2014-07-30 DIAGNOSIS — Z125 Encounter for screening for malignant neoplasm of prostate: Secondary | ICD-10-CM | POA: Diagnosis not present

## 2014-07-30 DIAGNOSIS — E785 Hyperlipidemia, unspecified: Secondary | ICD-10-CM | POA: Diagnosis not present

## 2014-07-30 DIAGNOSIS — Z0000 Encounter for general adult medical examination without abnormal findings: Secondary | ICD-10-CM | POA: Insufficient documentation

## 2014-07-30 DIAGNOSIS — F71 Moderate intellectual disabilities: Secondary | ICD-10-CM | POA: Diagnosis not present

## 2014-07-30 DIAGNOSIS — E119 Type 2 diabetes mellitus without complications: Secondary | ICD-10-CM | POA: Diagnosis not present

## 2014-07-31 DIAGNOSIS — F71 Moderate intellectual disabilities: Secondary | ICD-10-CM | POA: Diagnosis not present

## 2014-08-01 DIAGNOSIS — F71 Moderate intellectual disabilities: Secondary | ICD-10-CM | POA: Diagnosis not present

## 2014-08-02 DIAGNOSIS — F71 Moderate intellectual disabilities: Secondary | ICD-10-CM | POA: Diagnosis not present

## 2014-08-03 DIAGNOSIS — F71 Moderate intellectual disabilities: Secondary | ICD-10-CM | POA: Diagnosis not present

## 2014-08-04 DIAGNOSIS — F71 Moderate intellectual disabilities: Secondary | ICD-10-CM | POA: Diagnosis not present

## 2014-08-05 DIAGNOSIS — F71 Moderate intellectual disabilities: Secondary | ICD-10-CM | POA: Diagnosis not present

## 2014-08-06 DIAGNOSIS — Z0000 Encounter for general adult medical examination without abnormal findings: Secondary | ICD-10-CM | POA: Diagnosis not present

## 2014-08-06 DIAGNOSIS — M25569 Pain in unspecified knee: Secondary | ICD-10-CM | POA: Diagnosis not present

## 2014-08-06 DIAGNOSIS — I10 Essential (primary) hypertension: Secondary | ICD-10-CM | POA: Diagnosis not present

## 2014-08-06 DIAGNOSIS — E669 Obesity, unspecified: Secondary | ICD-10-CM | POA: Diagnosis not present

## 2014-08-06 DIAGNOSIS — E785 Hyperlipidemia, unspecified: Secondary | ICD-10-CM | POA: Diagnosis not present

## 2014-08-06 DIAGNOSIS — E559 Vitamin D deficiency, unspecified: Secondary | ICD-10-CM | POA: Diagnosis not present

## 2014-08-06 DIAGNOSIS — F71 Moderate intellectual disabilities: Secondary | ICD-10-CM | POA: Diagnosis not present

## 2014-08-06 DIAGNOSIS — R451 Restlessness and agitation: Secondary | ICD-10-CM | POA: Diagnosis not present

## 2014-08-06 DIAGNOSIS — K219 Gastro-esophageal reflux disease without esophagitis: Secondary | ICD-10-CM | POA: Diagnosis not present

## 2014-08-07 ENCOUNTER — Encounter: Payer: Self-pay | Admitting: Internal Medicine

## 2014-08-07 DIAGNOSIS — Z1212 Encounter for screening for malignant neoplasm of rectum: Secondary | ICD-10-CM | POA: Diagnosis not present

## 2014-08-07 DIAGNOSIS — F71 Moderate intellectual disabilities: Secondary | ICD-10-CM | POA: Diagnosis not present

## 2014-08-08 DIAGNOSIS — F71 Moderate intellectual disabilities: Secondary | ICD-10-CM | POA: Diagnosis not present

## 2014-08-09 DIAGNOSIS — F71 Moderate intellectual disabilities: Secondary | ICD-10-CM | POA: Diagnosis not present

## 2014-08-10 DIAGNOSIS — F71 Moderate intellectual disabilities: Secondary | ICD-10-CM | POA: Diagnosis not present

## 2014-08-11 DIAGNOSIS — F71 Moderate intellectual disabilities: Secondary | ICD-10-CM | POA: Diagnosis not present

## 2014-08-12 DIAGNOSIS — F71 Moderate intellectual disabilities: Secondary | ICD-10-CM | POA: Diagnosis not present

## 2014-08-13 DIAGNOSIS — F71 Moderate intellectual disabilities: Secondary | ICD-10-CM | POA: Diagnosis not present

## 2014-08-14 DIAGNOSIS — F71 Moderate intellectual disabilities: Secondary | ICD-10-CM | POA: Diagnosis not present

## 2014-08-15 ENCOUNTER — Ambulatory Visit: Payer: Commercial Managed Care - HMO | Admitting: *Deleted

## 2014-08-15 DIAGNOSIS — F71 Moderate intellectual disabilities: Secondary | ICD-10-CM | POA: Diagnosis not present

## 2014-08-15 DIAGNOSIS — E1151 Type 2 diabetes mellitus with diabetic peripheral angiopathy without gangrene: Secondary | ICD-10-CM

## 2014-08-15 NOTE — Progress Notes (Signed)
Patient ID: Arthur Weaver, male   DOB: 03-Mar-1960, 54 y.o.   MRN: [id number] Patient presents for diabetic shoe measurement.

## 2014-08-16 DIAGNOSIS — F71 Moderate intellectual disabilities: Secondary | ICD-10-CM | POA: Diagnosis not present

## 2014-08-17 DIAGNOSIS — F71 Moderate intellectual disabilities: Secondary | ICD-10-CM | POA: Diagnosis not present

## 2014-08-18 DIAGNOSIS — F71 Moderate intellectual disabilities: Secondary | ICD-10-CM | POA: Diagnosis not present

## 2014-08-19 DIAGNOSIS — F71 Moderate intellectual disabilities: Secondary | ICD-10-CM | POA: Diagnosis not present

## 2014-08-20 DIAGNOSIS — F71 Moderate intellectual disabilities: Secondary | ICD-10-CM | POA: Diagnosis not present

## 2014-08-21 DIAGNOSIS — F71 Moderate intellectual disabilities: Secondary | ICD-10-CM | POA: Diagnosis not present

## 2014-08-22 DIAGNOSIS — F71 Moderate intellectual disabilities: Secondary | ICD-10-CM | POA: Diagnosis not present

## 2014-08-23 DIAGNOSIS — F71 Moderate intellectual disabilities: Secondary | ICD-10-CM | POA: Diagnosis not present

## 2014-08-24 DIAGNOSIS — F71 Moderate intellectual disabilities: Secondary | ICD-10-CM | POA: Diagnosis not present

## 2014-08-25 DIAGNOSIS — F71 Moderate intellectual disabilities: Secondary | ICD-10-CM | POA: Diagnosis not present

## 2014-08-26 DIAGNOSIS — F71 Moderate intellectual disabilities: Secondary | ICD-10-CM | POA: Diagnosis not present

## 2014-08-27 DIAGNOSIS — F71 Moderate intellectual disabilities: Secondary | ICD-10-CM | POA: Diagnosis not present

## 2014-08-28 DIAGNOSIS — F71 Moderate intellectual disabilities: Secondary | ICD-10-CM | POA: Diagnosis not present

## 2014-08-29 DIAGNOSIS — F71 Moderate intellectual disabilities: Secondary | ICD-10-CM | POA: Diagnosis not present

## 2014-08-30 DIAGNOSIS — F71 Moderate intellectual disabilities: Secondary | ICD-10-CM | POA: Diagnosis not present

## 2014-08-31 DIAGNOSIS — F71 Moderate intellectual disabilities: Secondary | ICD-10-CM | POA: Diagnosis not present

## 2014-09-01 DIAGNOSIS — F71 Moderate intellectual disabilities: Secondary | ICD-10-CM | POA: Diagnosis not present

## 2014-09-02 ENCOUNTER — Encounter: Payer: Self-pay | Admitting: Podiatry

## 2014-09-02 ENCOUNTER — Ambulatory Visit (INDEPENDENT_AMBULATORY_CARE_PROVIDER_SITE_OTHER): Payer: Commercial Managed Care - HMO | Admitting: Podiatry

## 2014-09-02 DIAGNOSIS — Q828 Other specified congenital malformations of skin: Secondary | ICD-10-CM

## 2014-09-02 DIAGNOSIS — M170 Bilateral primary osteoarthritis of knee: Secondary | ICD-10-CM | POA: Diagnosis not present

## 2014-09-02 DIAGNOSIS — B351 Tinea unguium: Secondary | ICD-10-CM

## 2014-09-02 DIAGNOSIS — M79641 Pain in right hand: Secondary | ICD-10-CM | POA: Diagnosis not present

## 2014-09-02 DIAGNOSIS — F71 Moderate intellectual disabilities: Secondary | ICD-10-CM | POA: Diagnosis not present

## 2014-09-02 DIAGNOSIS — L84 Corns and callosities: Secondary | ICD-10-CM

## 2014-09-02 DIAGNOSIS — M5137 Other intervertebral disc degeneration, lumbosacral region: Secondary | ICD-10-CM | POA: Diagnosis not present

## 2014-09-02 DIAGNOSIS — M79673 Pain in unspecified foot: Secondary | ICD-10-CM | POA: Diagnosis not present

## 2014-09-02 DIAGNOSIS — M19041 Primary osteoarthritis, right hand: Secondary | ICD-10-CM | POA: Diagnosis not present

## 2014-09-02 NOTE — Progress Notes (Signed)
Subjective:     Patient ID: Arthur Weaver, male   DOB: [birth year], 54 y.o.   MRN: [id number]  HPIThis patient presents to the office with painful long thick nails.  His nails are painful walking and wearing his shoes.  He is presently waiting for his diabetic shoes to arrive.  He also remarks that his right foot is flat when he walks and wears his shoes. He has painful callus both feet.   Review of Systems     Objective:   Physical Exam GENERAL APPEARANCE: Alert, conversant. Appropriately groomed. No acute distress.  VASCULAR: Pedal pulses absent  DP and PT bilateral.  Capillary refill time is immediate to all digits,   NEUROLOGIC: sensation is diminished  epicritically and protectively to 5.07 monofilament at 5/5 sites bilateral.  Light touch is intact bilateral.  MUSCULOSKELETAL: acceptable muscle strength, tone and stability bilateral.  Intrinsic muscluature intact bilateral.  Rectus appearance of foot and digits noted bilateral. Medial bulge noted at TNJ right foot.   DERMATOLOGIC: skin color, texture, and turgor are within normal limits.  No preulcerative lesions or ulcers  are seen, no interdigital maceration noted.  No open lesions present.. No drainage noted.  Pinch callus B/L.  Callus sub 1st B/L.  Distal clavi 2,3 B/L NAILS  Thick disfigured discolored nails both feet.     Assessment:     Onychomycosis    Diabetes Mellitus     Plan:     Debridement of Nails.  Shoes will possibly be shipped by 09/04/14. Wants to be evaluated for his flat foot by Dr. Paulla Dolly per MD referral. Debridement of callus.

## 2014-09-03 DIAGNOSIS — F71 Moderate intellectual disabilities: Secondary | ICD-10-CM | POA: Diagnosis not present

## 2014-09-04 DIAGNOSIS — F71 Moderate intellectual disabilities: Secondary | ICD-10-CM | POA: Diagnosis not present

## 2014-09-05 DIAGNOSIS — F71 Moderate intellectual disabilities: Secondary | ICD-10-CM | POA: Diagnosis not present

## 2014-09-06 DIAGNOSIS — F71 Moderate intellectual disabilities: Secondary | ICD-10-CM | POA: Diagnosis not present

## 2014-09-07 DIAGNOSIS — F71 Moderate intellectual disabilities: Secondary | ICD-10-CM | POA: Diagnosis not present

## 2014-09-08 DIAGNOSIS — F71 Moderate intellectual disabilities: Secondary | ICD-10-CM | POA: Diagnosis not present

## 2014-09-09 DIAGNOSIS — F71 Moderate intellectual disabilities: Secondary | ICD-10-CM | POA: Diagnosis not present

## 2014-09-10 DIAGNOSIS — F71 Moderate intellectual disabilities: Secondary | ICD-10-CM | POA: Diagnosis not present

## 2014-09-11 DIAGNOSIS — F71 Moderate intellectual disabilities: Secondary | ICD-10-CM | POA: Diagnosis not present

## 2014-09-12 ENCOUNTER — Encounter: Payer: Self-pay | Admitting: Podiatry

## 2014-09-12 ENCOUNTER — Ambulatory Visit (INDEPENDENT_AMBULATORY_CARE_PROVIDER_SITE_OTHER): Payer: Commercial Managed Care - HMO | Admitting: Podiatry

## 2014-09-12 VITALS — BP 116/76 | HR 79 | Resp 16

## 2014-09-12 DIAGNOSIS — F71 Moderate intellectual disabilities: Secondary | ICD-10-CM | POA: Diagnosis not present

## 2014-09-12 DIAGNOSIS — Q828 Other specified congenital malformations of skin: Secondary | ICD-10-CM

## 2014-09-12 DIAGNOSIS — E1151 Type 2 diabetes mellitus with diabetic peripheral angiopathy without gangrene: Secondary | ICD-10-CM

## 2014-09-12 DIAGNOSIS — M2040 Other hammer toe(s) (acquired), unspecified foot: Secondary | ICD-10-CM

## 2014-09-12 DIAGNOSIS — M79673 Pain in unspecified foot: Secondary | ICD-10-CM | POA: Diagnosis not present

## 2014-09-12 NOTE — Patient Instructions (Signed)

## 2014-09-13 DIAGNOSIS — F71 Moderate intellectual disabilities: Secondary | ICD-10-CM | POA: Diagnosis not present

## 2014-09-13 NOTE — Progress Notes (Signed)
Subjective:     Patient ID: Arthur Weaver, male   DOB: January 13, 1961, 54 y.o.   MRN: [id number]  HPI patient presents stating with caregiver that they are concerned about his right ankle turning in and he's here to pickup his diabetic shoes   Review of Systems     Objective:   Physical Exam Neurovascular status unchanged with diminishment of sharp Dole vibratory-type sensation and patient noted to have right ankle patient on the medial side but no breakdown of skin or no indication of ulceration    Assessment:     Significant flattening of the arch right but no indication of Charcot foot type were ulceration    Plan:     Reviewed condition and at this time discussed with caregiver AFO orthotics possible for the future and also that I do not recommend aggressive treatment unless drainage or redness should start to occur from this area. Orthotics were dispensed and fitted well at this time along with diabetic shoes to control the patient's pathology long-term diabetes

## 2014-09-14 DIAGNOSIS — F71 Moderate intellectual disabilities: Secondary | ICD-10-CM | POA: Diagnosis not present

## 2014-09-15 DIAGNOSIS — F71 Moderate intellectual disabilities: Secondary | ICD-10-CM | POA: Diagnosis not present

## 2014-09-16 DIAGNOSIS — F71 Moderate intellectual disabilities: Secondary | ICD-10-CM | POA: Diagnosis not present

## 2014-09-17 DIAGNOSIS — F71 Moderate intellectual disabilities: Secondary | ICD-10-CM | POA: Diagnosis not present

## 2014-09-18 DIAGNOSIS — F71 Moderate intellectual disabilities: Secondary | ICD-10-CM | POA: Diagnosis not present

## 2014-09-19 DIAGNOSIS — F71 Moderate intellectual disabilities: Secondary | ICD-10-CM | POA: Diagnosis not present

## 2014-09-20 DIAGNOSIS — F71 Moderate intellectual disabilities: Secondary | ICD-10-CM | POA: Diagnosis not present

## 2014-09-21 DIAGNOSIS — F71 Moderate intellectual disabilities: Secondary | ICD-10-CM | POA: Diagnosis not present

## 2014-09-22 DIAGNOSIS — F71 Moderate intellectual disabilities: Secondary | ICD-10-CM | POA: Diagnosis not present

## 2014-09-23 DIAGNOSIS — F71 Moderate intellectual disabilities: Secondary | ICD-10-CM | POA: Diagnosis not present

## 2014-09-24 DIAGNOSIS — F71 Moderate intellectual disabilities: Secondary | ICD-10-CM | POA: Diagnosis not present

## 2014-09-25 DIAGNOSIS — F71 Moderate intellectual disabilities: Secondary | ICD-10-CM | POA: Diagnosis not present

## 2014-09-26 DIAGNOSIS — F71 Moderate intellectual disabilities: Secondary | ICD-10-CM | POA: Diagnosis not present

## 2014-09-27 DIAGNOSIS — F71 Moderate intellectual disabilities: Secondary | ICD-10-CM | POA: Diagnosis not present

## 2014-09-28 DIAGNOSIS — F71 Moderate intellectual disabilities: Secondary | ICD-10-CM | POA: Diagnosis not present

## 2014-09-29 DIAGNOSIS — F71 Moderate intellectual disabilities: Secondary | ICD-10-CM | POA: Diagnosis not present

## 2014-09-30 DIAGNOSIS — F71 Moderate intellectual disabilities: Secondary | ICD-10-CM | POA: Diagnosis not present

## 2014-10-01 DIAGNOSIS — F71 Moderate intellectual disabilities: Secondary | ICD-10-CM | POA: Diagnosis not present

## 2014-10-02 DIAGNOSIS — F71 Moderate intellectual disabilities: Secondary | ICD-10-CM | POA: Diagnosis not present

## 2014-10-03 DIAGNOSIS — F71 Moderate intellectual disabilities: Secondary | ICD-10-CM | POA: Diagnosis not present

## 2014-10-04 DIAGNOSIS — F71 Moderate intellectual disabilities: Secondary | ICD-10-CM | POA: Diagnosis not present

## 2014-10-05 DIAGNOSIS — F71 Moderate intellectual disabilities: Secondary | ICD-10-CM | POA: Diagnosis not present

## 2014-10-06 DIAGNOSIS — F71 Moderate intellectual disabilities: Secondary | ICD-10-CM | POA: Diagnosis not present

## 2014-10-07 DIAGNOSIS — F71 Moderate intellectual disabilities: Secondary | ICD-10-CM | POA: Diagnosis not present

## 2014-10-08 DIAGNOSIS — F71 Moderate intellectual disabilities: Secondary | ICD-10-CM | POA: Diagnosis not present

## 2014-11-07 DIAGNOSIS — Z6838 Body mass index (BMI) 38.0-38.9, adult: Secondary | ICD-10-CM | POA: Diagnosis not present

## 2014-11-07 DIAGNOSIS — E669 Obesity, unspecified: Secondary | ICD-10-CM | POA: Diagnosis not present

## 2014-11-07 DIAGNOSIS — I10 Essential (primary) hypertension: Secondary | ICD-10-CM | POA: Diagnosis not present

## 2014-11-07 DIAGNOSIS — E785 Hyperlipidemia, unspecified: Secondary | ICD-10-CM | POA: Diagnosis not present

## 2014-11-07 DIAGNOSIS — E119 Type 2 diabetes mellitus without complications: Secondary | ICD-10-CM | POA: Diagnosis not present

## 2014-11-07 DIAGNOSIS — Z23 Encounter for immunization: Secondary | ICD-10-CM | POA: Diagnosis not present

## 2014-11-14 DIAGNOSIS — B353 Tinea pedis: Secondary | ICD-10-CM | POA: Diagnosis not present

## 2014-11-14 DIAGNOSIS — Z6838 Body mass index (BMI) 38.0-38.9, adult: Secondary | ICD-10-CM | POA: Diagnosis not present

## 2014-12-09 ENCOUNTER — Encounter: Payer: Self-pay | Admitting: Podiatry

## 2014-12-09 ENCOUNTER — Ambulatory Visit (INDEPENDENT_AMBULATORY_CARE_PROVIDER_SITE_OTHER): Payer: Commercial Managed Care - HMO | Admitting: Podiatry

## 2014-12-09 DIAGNOSIS — B351 Tinea unguium: Secondary | ICD-10-CM

## 2014-12-09 DIAGNOSIS — M79673 Pain in unspecified foot: Secondary | ICD-10-CM | POA: Diagnosis not present

## 2014-12-09 DIAGNOSIS — L84 Corns and callosities: Secondary | ICD-10-CM

## 2014-12-09 DIAGNOSIS — F71 Moderate intellectual disabilities: Secondary | ICD-10-CM | POA: Diagnosis not present

## 2014-12-09 NOTE — Progress Notes (Signed)
Subjective:     Patient ID: Arthur Weaver, male   DOB: April 25, 1960, 54 y.o.   MRN: [id number]  HPIThis patient presents to the office with painful long thick nails.  His nails are painful walking and wearing his shoes.  He is presently waiting for his diabetic shoes to arrive.  He also remarks that his right foot is flat when he walks and wears his shoes. He has painful callus both feet.   Review of Systems     Objective:   Physical Exam GENERAL APPEARANCE: Alert, conversant. Appropriately groomed. No acute distress.  VASCULAR: Pedal pulses absent  DP and PT bilateral.  Capillary refill time is immediate to all digits,   NEUROLOGIC: sensation is diminished  epicritically and protectively to 5.07 monofilament at 5/5 sites bilateral.  Light touch is intact bilateral.  MUSCULOSKELETAL: acceptable muscle strength, tone and stability bilateral.  Intrinsic muscluature intact bilateral.  Rectus appearance of foot and digits noted bilateral. Medial bulge noted at TNJ right foot. HAV B/L.  DERMATOLOGIC: skin color, texture, and turgor are within normal limits.  No preulcerative lesions or ulcers  are seen, no interdigital maceration noted.  No open lesions present.. No drainage noted.  Pinch callus B/L.  Callus sub 1st B/L.   NAILS  Thick disfigured discolored nails both feet.     Assessment:     Onychomycosis    Diabetes Mellitus     Plan:     Debridement of Nails.  . Debridement of callus. RTC 3 months.

## 2014-12-10 DIAGNOSIS — N402 Nodular prostate without lower urinary tract symptoms: Secondary | ICD-10-CM | POA: Diagnosis not present

## 2014-12-10 DIAGNOSIS — R3915 Urgency of urination: Secondary | ICD-10-CM | POA: Diagnosis not present

## 2014-12-10 DIAGNOSIS — N3941 Urge incontinence: Secondary | ICD-10-CM | POA: Diagnosis not present

## 2014-12-10 DIAGNOSIS — N401 Enlarged prostate with lower urinary tract symptoms: Secondary | ICD-10-CM | POA: Diagnosis not present

## 2014-12-10 DIAGNOSIS — R32 Unspecified urinary incontinence: Secondary | ICD-10-CM | POA: Diagnosis not present

## 2014-12-10 DIAGNOSIS — F71 Moderate intellectual disabilities: Secondary | ICD-10-CM | POA: Diagnosis not present

## 2014-12-10 DIAGNOSIS — R351 Nocturia: Secondary | ICD-10-CM | POA: Diagnosis not present

## 2014-12-11 DIAGNOSIS — F71 Moderate intellectual disabilities: Secondary | ICD-10-CM | POA: Diagnosis not present

## 2014-12-12 DIAGNOSIS — F71 Moderate intellectual disabilities: Secondary | ICD-10-CM | POA: Diagnosis not present

## 2014-12-13 DIAGNOSIS — F71 Moderate intellectual disabilities: Secondary | ICD-10-CM | POA: Diagnosis not present

## 2014-12-14 DIAGNOSIS — F71 Moderate intellectual disabilities: Secondary | ICD-10-CM | POA: Diagnosis not present

## 2014-12-15 DIAGNOSIS — F71 Moderate intellectual disabilities: Secondary | ICD-10-CM | POA: Diagnosis not present

## 2014-12-16 DIAGNOSIS — F71 Moderate intellectual disabilities: Secondary | ICD-10-CM | POA: Diagnosis not present

## 2014-12-17 DIAGNOSIS — F71 Moderate intellectual disabilities: Secondary | ICD-10-CM | POA: Diagnosis not present

## 2014-12-18 DIAGNOSIS — F71 Moderate intellectual disabilities: Secondary | ICD-10-CM | POA: Diagnosis not present

## 2014-12-19 DIAGNOSIS — F71 Moderate intellectual disabilities: Secondary | ICD-10-CM | POA: Diagnosis not present

## 2014-12-20 DIAGNOSIS — F71 Moderate intellectual disabilities: Secondary | ICD-10-CM | POA: Diagnosis not present

## 2014-12-21 DIAGNOSIS — F71 Moderate intellectual disabilities: Secondary | ICD-10-CM | POA: Diagnosis not present

## 2014-12-22 DIAGNOSIS — F71 Moderate intellectual disabilities: Secondary | ICD-10-CM | POA: Diagnosis not present

## 2014-12-23 DIAGNOSIS — F71 Moderate intellectual disabilities: Secondary | ICD-10-CM | POA: Diagnosis not present

## 2014-12-23 DIAGNOSIS — N3941 Urge incontinence: Secondary | ICD-10-CM | POA: Diagnosis not present

## 2014-12-23 DIAGNOSIS — R351 Nocturia: Secondary | ICD-10-CM | POA: Diagnosis not present

## 2014-12-24 DIAGNOSIS — F71 Moderate intellectual disabilities: Secondary | ICD-10-CM | POA: Diagnosis not present

## 2014-12-25 DIAGNOSIS — F71 Moderate intellectual disabilities: Secondary | ICD-10-CM | POA: Diagnosis not present

## 2014-12-26 DIAGNOSIS — F71 Moderate intellectual disabilities: Secondary | ICD-10-CM | POA: Diagnosis not present

## 2014-12-27 DIAGNOSIS — F71 Moderate intellectual disabilities: Secondary | ICD-10-CM | POA: Diagnosis not present

## 2014-12-28 DIAGNOSIS — F71 Moderate intellectual disabilities: Secondary | ICD-10-CM | POA: Diagnosis not present

## 2014-12-29 DIAGNOSIS — F71 Moderate intellectual disabilities: Secondary | ICD-10-CM | POA: Diagnosis not present

## 2014-12-30 DIAGNOSIS — F71 Moderate intellectual disabilities: Secondary | ICD-10-CM | POA: Diagnosis not present

## 2014-12-31 DIAGNOSIS — F71 Moderate intellectual disabilities: Secondary | ICD-10-CM | POA: Diagnosis not present

## 2015-01-01 DIAGNOSIS — F71 Moderate intellectual disabilities: Secondary | ICD-10-CM | POA: Diagnosis not present

## 2015-01-02 DIAGNOSIS — F71 Moderate intellectual disabilities: Secondary | ICD-10-CM | POA: Diagnosis not present

## 2015-01-03 DIAGNOSIS — F71 Moderate intellectual disabilities: Secondary | ICD-10-CM | POA: Diagnosis not present

## 2015-01-04 DIAGNOSIS — F71 Moderate intellectual disabilities: Secondary | ICD-10-CM | POA: Diagnosis not present

## 2015-01-05 DIAGNOSIS — F71 Moderate intellectual disabilities: Secondary | ICD-10-CM | POA: Diagnosis not present

## 2015-01-06 DIAGNOSIS — F71 Moderate intellectual disabilities: Secondary | ICD-10-CM | POA: Diagnosis not present

## 2015-01-07 DIAGNOSIS — F71 Moderate intellectual disabilities: Secondary | ICD-10-CM | POA: Diagnosis not present

## 2015-01-08 DIAGNOSIS — F71 Moderate intellectual disabilities: Secondary | ICD-10-CM | POA: Diagnosis not present

## 2015-01-09 DIAGNOSIS — F71 Moderate intellectual disabilities: Secondary | ICD-10-CM | POA: Diagnosis not present

## 2015-01-10 DIAGNOSIS — F71 Moderate intellectual disabilities: Secondary | ICD-10-CM | POA: Diagnosis not present

## 2015-01-11 DIAGNOSIS — F71 Moderate intellectual disabilities: Secondary | ICD-10-CM | POA: Diagnosis not present

## 2015-01-12 DIAGNOSIS — F71 Moderate intellectual disabilities: Secondary | ICD-10-CM | POA: Diagnosis not present

## 2015-01-13 DIAGNOSIS — F71 Moderate intellectual disabilities: Secondary | ICD-10-CM | POA: Diagnosis not present

## 2015-01-14 DIAGNOSIS — F71 Moderate intellectual disabilities: Secondary | ICD-10-CM | POA: Diagnosis not present

## 2015-01-15 DIAGNOSIS — F71 Moderate intellectual disabilities: Secondary | ICD-10-CM | POA: Diagnosis not present

## 2015-01-15 DIAGNOSIS — M1712 Unilateral primary osteoarthritis, left knee: Secondary | ICD-10-CM | POA: Diagnosis not present

## 2015-01-15 DIAGNOSIS — M4056 Lordosis, unspecified, lumbar region: Secondary | ICD-10-CM | POA: Diagnosis not present

## 2015-01-15 DIAGNOSIS — M19041 Primary osteoarthritis, right hand: Secondary | ICD-10-CM | POA: Diagnosis not present

## 2015-01-15 DIAGNOSIS — M1711 Unilateral primary osteoarthritis, right knee: Secondary | ICD-10-CM | POA: Diagnosis not present

## 2015-01-15 DIAGNOSIS — M19211 Secondary osteoarthritis, right shoulder: Secondary | ICD-10-CM | POA: Diagnosis not present

## 2015-01-16 DIAGNOSIS — F71 Moderate intellectual disabilities: Secondary | ICD-10-CM | POA: Diagnosis not present

## 2015-01-17 DIAGNOSIS — F71 Moderate intellectual disabilities: Secondary | ICD-10-CM | POA: Diagnosis not present

## 2015-01-18 DIAGNOSIS — F71 Moderate intellectual disabilities: Secondary | ICD-10-CM | POA: Diagnosis not present

## 2015-01-19 DIAGNOSIS — F71 Moderate intellectual disabilities: Secondary | ICD-10-CM | POA: Diagnosis not present

## 2015-01-20 DIAGNOSIS — F71 Moderate intellectual disabilities: Secondary | ICD-10-CM | POA: Diagnosis not present

## 2015-01-21 DIAGNOSIS — F71 Moderate intellectual disabilities: Secondary | ICD-10-CM | POA: Diagnosis not present

## 2015-01-22 DIAGNOSIS — F71 Moderate intellectual disabilities: Secondary | ICD-10-CM | POA: Diagnosis not present

## 2015-01-23 DIAGNOSIS — F71 Moderate intellectual disabilities: Secondary | ICD-10-CM | POA: Diagnosis not present

## 2015-01-24 DIAGNOSIS — F71 Moderate intellectual disabilities: Secondary | ICD-10-CM | POA: Diagnosis not present

## 2015-01-25 DIAGNOSIS — F71 Moderate intellectual disabilities: Secondary | ICD-10-CM | POA: Diagnosis not present

## 2015-01-26 DIAGNOSIS — F71 Moderate intellectual disabilities: Secondary | ICD-10-CM | POA: Diagnosis not present

## 2015-01-27 DIAGNOSIS — M1712 Unilateral primary osteoarthritis, left knee: Secondary | ICD-10-CM | POA: Diagnosis not present

## 2015-01-27 DIAGNOSIS — F71 Moderate intellectual disabilities: Secondary | ICD-10-CM | POA: Diagnosis not present

## 2015-01-28 DIAGNOSIS — F71 Moderate intellectual disabilities: Secondary | ICD-10-CM | POA: Diagnosis not present

## 2015-01-29 DIAGNOSIS — F71 Moderate intellectual disabilities: Secondary | ICD-10-CM | POA: Diagnosis not present

## 2015-01-30 DIAGNOSIS — F71 Moderate intellectual disabilities: Secondary | ICD-10-CM | POA: Diagnosis not present

## 2015-01-31 DIAGNOSIS — F71 Moderate intellectual disabilities: Secondary | ICD-10-CM | POA: Diagnosis not present

## 2015-02-01 DIAGNOSIS — F71 Moderate intellectual disabilities: Secondary | ICD-10-CM | POA: Diagnosis not present

## 2015-02-02 DIAGNOSIS — F71 Moderate intellectual disabilities: Secondary | ICD-10-CM | POA: Diagnosis not present

## 2015-02-03 DIAGNOSIS — F71 Moderate intellectual disabilities: Secondary | ICD-10-CM | POA: Diagnosis not present

## 2015-02-03 DIAGNOSIS — N401 Enlarged prostate with lower urinary tract symptoms: Secondary | ICD-10-CM | POA: Diagnosis not present

## 2015-02-03 DIAGNOSIS — R3915 Urgency of urination: Secondary | ICD-10-CM | POA: Diagnosis not present

## 2015-02-03 DIAGNOSIS — N138 Other obstructive and reflux uropathy: Secondary | ICD-10-CM | POA: Diagnosis not present

## 2015-02-03 DIAGNOSIS — N402 Nodular prostate without lower urinary tract symptoms: Secondary | ICD-10-CM | POA: Diagnosis not present

## 2015-02-03 DIAGNOSIS — N310 Uninhibited neuropathic bladder, not elsewhere classified: Secondary | ICD-10-CM | POA: Diagnosis not present

## 2015-02-04 DIAGNOSIS — F71 Moderate intellectual disabilities: Secondary | ICD-10-CM | POA: Diagnosis not present

## 2015-02-05 DIAGNOSIS — F71 Moderate intellectual disabilities: Secondary | ICD-10-CM | POA: Diagnosis not present

## 2015-02-06 DIAGNOSIS — F71 Moderate intellectual disabilities: Secondary | ICD-10-CM | POA: Diagnosis not present

## 2015-02-07 DIAGNOSIS — F71 Moderate intellectual disabilities: Secondary | ICD-10-CM | POA: Diagnosis not present

## 2015-02-08 DIAGNOSIS — F71 Moderate intellectual disabilities: Secondary | ICD-10-CM | POA: Diagnosis not present

## 2015-02-09 DIAGNOSIS — F71 Moderate intellectual disabilities: Secondary | ICD-10-CM | POA: Diagnosis not present

## 2015-02-10 DIAGNOSIS — F71 Moderate intellectual disabilities: Secondary | ICD-10-CM | POA: Diagnosis not present

## 2015-02-11 DIAGNOSIS — F71 Moderate intellectual disabilities: Secondary | ICD-10-CM | POA: Diagnosis not present

## 2015-02-12 DIAGNOSIS — F71 Moderate intellectual disabilities: Secondary | ICD-10-CM | POA: Diagnosis not present

## 2015-02-13 DIAGNOSIS — F71 Moderate intellectual disabilities: Secondary | ICD-10-CM | POA: Diagnosis not present

## 2015-02-14 DIAGNOSIS — F71 Moderate intellectual disabilities: Secondary | ICD-10-CM | POA: Diagnosis not present

## 2015-02-15 DIAGNOSIS — F71 Moderate intellectual disabilities: Secondary | ICD-10-CM | POA: Diagnosis not present

## 2015-02-16 DIAGNOSIS — F71 Moderate intellectual disabilities: Secondary | ICD-10-CM | POA: Diagnosis not present

## 2015-02-17 ENCOUNTER — Encounter: Payer: Self-pay | Admitting: Podiatry

## 2015-02-17 ENCOUNTER — Ambulatory Visit (INDEPENDENT_AMBULATORY_CARE_PROVIDER_SITE_OTHER): Payer: Commercial Managed Care - HMO | Admitting: Podiatry

## 2015-02-17 DIAGNOSIS — Q828 Other specified congenital malformations of skin: Secondary | ICD-10-CM | POA: Diagnosis not present

## 2015-02-17 DIAGNOSIS — E119 Type 2 diabetes mellitus without complications: Secondary | ICD-10-CM | POA: Diagnosis not present

## 2015-02-17 DIAGNOSIS — B351 Tinea unguium: Secondary | ICD-10-CM | POA: Diagnosis not present

## 2015-02-17 DIAGNOSIS — M79673 Pain in unspecified foot: Secondary | ICD-10-CM

## 2015-02-17 DIAGNOSIS — F71 Moderate intellectual disabilities: Secondary | ICD-10-CM | POA: Diagnosis not present

## 2015-02-17 NOTE — Progress Notes (Addendum)
Subjective:     Patient ID: Arthur Weaver, male   DOB: 1960-03-22, 55 y.o.   MRN: [id number]  HPIThis patient presents to the office with painful long thick nails.  His nails are painful walking and wearing his shoes.  .  He also remarks that his right foot is flat when he walks and wears his shoes. He has painful callus both feet.   Review of Systems     Objective:   Physical Exam GENERAL APPEARANCE: Alert, conversant. Appropriately groomed. No acute distress.  VASCULAR: Pedal pulses absent  DP and PT bilateral.  Capillary refill time is immediate to all digits,   NEUROLOGIC: sensation is diminished  epicritically and protectively to 5.07 monofilament at 5/5 sites bilateral.  Light touch is intact bilateral.  MUSCULOSKELETAL: acceptable muscle strength, tone and stability bilateral.  Intrinsic muscluature intact bilateral.  Rectus appearance of foot and digits noted bilateral. Medial bulge noted at TNJ right foot. HAV B/L.  Hammer toes 2-4 B/L  DERMATOLOGIC: skin color, texture, and turgor are within normal limits.  No preulcerative lesions or ulcers  are seen, no interdigital maceration noted.  No open lesions present.. No drainage noted.  Pinch callus B/L.  Callus sub 1st B/L. Distal clavi 2,3 B/L  NAILS  Thick disfigured discolored nails both feet.     Assessment:     Onychomycosis    Diabetes Mellitus     Plan:     Debridement of Nails.  . Debridement of callus. RTC 3 months.  Initiate diabetic shoes for 2017.   Gardiner Barefoot DPM

## 2015-02-18 DIAGNOSIS — M1711 Unilateral primary osteoarthritis, right knee: Secondary | ICD-10-CM | POA: Diagnosis not present

## 2015-02-18 DIAGNOSIS — F71 Moderate intellectual disabilities: Secondary | ICD-10-CM | POA: Diagnosis not present

## 2015-02-18 DIAGNOSIS — M170 Bilateral primary osteoarthritis of knee: Secondary | ICD-10-CM | POA: Diagnosis not present

## 2015-02-19 DIAGNOSIS — F71 Moderate intellectual disabilities: Secondary | ICD-10-CM | POA: Diagnosis not present

## 2015-02-20 DIAGNOSIS — F71 Moderate intellectual disabilities: Secondary | ICD-10-CM | POA: Diagnosis not present

## 2015-02-21 DIAGNOSIS — F71 Moderate intellectual disabilities: Secondary | ICD-10-CM | POA: Diagnosis not present

## 2015-02-22 DIAGNOSIS — F71 Moderate intellectual disabilities: Secondary | ICD-10-CM | POA: Diagnosis not present

## 2015-02-23 DIAGNOSIS — F71 Moderate intellectual disabilities: Secondary | ICD-10-CM | POA: Diagnosis not present

## 2015-02-24 DIAGNOSIS — F71 Moderate intellectual disabilities: Secondary | ICD-10-CM | POA: Diagnosis not present

## 2015-02-25 DIAGNOSIS — F71 Moderate intellectual disabilities: Secondary | ICD-10-CM | POA: Diagnosis not present

## 2015-02-26 DIAGNOSIS — F71 Moderate intellectual disabilities: Secondary | ICD-10-CM | POA: Diagnosis not present

## 2015-02-27 DIAGNOSIS — F71 Moderate intellectual disabilities: Secondary | ICD-10-CM | POA: Diagnosis not present

## 2015-02-28 DIAGNOSIS — F71 Moderate intellectual disabilities: Secondary | ICD-10-CM | POA: Diagnosis not present

## 2015-03-01 DIAGNOSIS — F71 Moderate intellectual disabilities: Secondary | ICD-10-CM | POA: Diagnosis not present

## 2015-03-02 DIAGNOSIS — Z6837 Body mass index (BMI) 37.0-37.9, adult: Secondary | ICD-10-CM | POA: Diagnosis not present

## 2015-03-02 DIAGNOSIS — M19041 Primary osteoarthritis, right hand: Secondary | ICD-10-CM | POA: Diagnosis not present

## 2015-03-02 DIAGNOSIS — F71 Moderate intellectual disabilities: Secondary | ICD-10-CM | POA: Diagnosis not present

## 2015-03-02 DIAGNOSIS — M47816 Spondylosis without myelopathy or radiculopathy, lumbar region: Secondary | ICD-10-CM | POA: Diagnosis not present

## 2015-03-02 DIAGNOSIS — I10 Essential (primary) hypertension: Secondary | ICD-10-CM | POA: Diagnosis not present

## 2015-03-02 DIAGNOSIS — E784 Other hyperlipidemia: Secondary | ICD-10-CM | POA: Diagnosis not present

## 2015-03-02 DIAGNOSIS — Z791 Long term (current) use of non-steroidal anti-inflammatories (NSAID): Secondary | ICD-10-CM | POA: Diagnosis not present

## 2015-03-02 DIAGNOSIS — M5030 Other cervical disc degeneration, unspecified cervical region: Secondary | ICD-10-CM | POA: Diagnosis not present

## 2015-03-02 DIAGNOSIS — E119 Type 2 diabetes mellitus without complications: Secondary | ICD-10-CM | POA: Diagnosis not present

## 2015-03-02 DIAGNOSIS — M25569 Pain in unspecified knee: Secondary | ICD-10-CM | POA: Diagnosis not present

## 2015-03-03 DIAGNOSIS — F71 Moderate intellectual disabilities: Secondary | ICD-10-CM | POA: Diagnosis not present

## 2015-03-04 DIAGNOSIS — F71 Moderate intellectual disabilities: Secondary | ICD-10-CM | POA: Diagnosis not present

## 2015-03-05 DIAGNOSIS — F71 Moderate intellectual disabilities: Secondary | ICD-10-CM | POA: Diagnosis not present

## 2015-03-06 DIAGNOSIS — F71 Moderate intellectual disabilities: Secondary | ICD-10-CM | POA: Diagnosis not present

## 2015-03-07 DIAGNOSIS — F71 Moderate intellectual disabilities: Secondary | ICD-10-CM | POA: Diagnosis not present

## 2015-03-08 DIAGNOSIS — F71 Moderate intellectual disabilities: Secondary | ICD-10-CM | POA: Diagnosis not present

## 2015-03-09 DIAGNOSIS — F71 Moderate intellectual disabilities: Secondary | ICD-10-CM | POA: Diagnosis not present

## 2015-03-10 DIAGNOSIS — F71 Moderate intellectual disabilities: Secondary | ICD-10-CM | POA: Diagnosis not present

## 2015-03-11 DIAGNOSIS — F71 Moderate intellectual disabilities: Secondary | ICD-10-CM | POA: Diagnosis not present

## 2015-03-12 DIAGNOSIS — F71 Moderate intellectual disabilities: Secondary | ICD-10-CM | POA: Diagnosis not present

## 2015-03-13 DIAGNOSIS — F71 Moderate intellectual disabilities: Secondary | ICD-10-CM | POA: Diagnosis not present

## 2015-03-14 DIAGNOSIS — F71 Moderate intellectual disabilities: Secondary | ICD-10-CM | POA: Diagnosis not present

## 2015-03-15 DIAGNOSIS — F71 Moderate intellectual disabilities: Secondary | ICD-10-CM | POA: Diagnosis not present

## 2015-03-16 DIAGNOSIS — F71 Moderate intellectual disabilities: Secondary | ICD-10-CM | POA: Diagnosis not present

## 2015-03-17 DIAGNOSIS — F71 Moderate intellectual disabilities: Secondary | ICD-10-CM | POA: Diagnosis not present

## 2015-03-18 DIAGNOSIS — F71 Moderate intellectual disabilities: Secondary | ICD-10-CM | POA: Diagnosis not present

## 2015-03-19 DIAGNOSIS — F71 Moderate intellectual disabilities: Secondary | ICD-10-CM | POA: Diagnosis not present

## 2015-03-20 DIAGNOSIS — F71 Moderate intellectual disabilities: Secondary | ICD-10-CM | POA: Diagnosis not present

## 2015-03-21 DIAGNOSIS — F71 Moderate intellectual disabilities: Secondary | ICD-10-CM | POA: Diagnosis not present

## 2015-03-22 DIAGNOSIS — F71 Moderate intellectual disabilities: Secondary | ICD-10-CM | POA: Diagnosis not present

## 2015-03-23 DIAGNOSIS — F71 Moderate intellectual disabilities: Secondary | ICD-10-CM | POA: Diagnosis not present

## 2015-03-24 DIAGNOSIS — M170 Bilateral primary osteoarthritis of knee: Secondary | ICD-10-CM | POA: Diagnosis not present

## 2015-03-24 DIAGNOSIS — F71 Moderate intellectual disabilities: Secondary | ICD-10-CM | POA: Diagnosis not present

## 2015-03-25 DIAGNOSIS — F71 Moderate intellectual disabilities: Secondary | ICD-10-CM | POA: Diagnosis not present

## 2015-03-31 DIAGNOSIS — M170 Bilateral primary osteoarthritis of knee: Secondary | ICD-10-CM | POA: Diagnosis not present

## 2015-04-07 DIAGNOSIS — E119 Type 2 diabetes mellitus without complications: Secondary | ICD-10-CM | POA: Diagnosis not present

## 2015-04-07 DIAGNOSIS — M170 Bilateral primary osteoarthritis of knee: Secondary | ICD-10-CM | POA: Diagnosis not present

## 2015-04-07 DIAGNOSIS — Z6838 Body mass index (BMI) 38.0-38.9, adult: Secondary | ICD-10-CM | POA: Diagnosis not present

## 2015-04-08 DIAGNOSIS — F71 Moderate intellectual disabilities: Secondary | ICD-10-CM | POA: Diagnosis not present

## 2015-04-09 DIAGNOSIS — F71 Moderate intellectual disabilities: Secondary | ICD-10-CM | POA: Diagnosis not present

## 2015-04-10 DIAGNOSIS — F71 Moderate intellectual disabilities: Secondary | ICD-10-CM | POA: Diagnosis not present

## 2015-04-11 DIAGNOSIS — F71 Moderate intellectual disabilities: Secondary | ICD-10-CM | POA: Diagnosis not present

## 2015-04-12 DIAGNOSIS — F71 Moderate intellectual disabilities: Secondary | ICD-10-CM | POA: Diagnosis not present

## 2015-04-13 DIAGNOSIS — F71 Moderate intellectual disabilities: Secondary | ICD-10-CM | POA: Diagnosis not present

## 2015-04-14 DIAGNOSIS — F71 Moderate intellectual disabilities: Secondary | ICD-10-CM | POA: Diagnosis not present

## 2015-04-15 DIAGNOSIS — F71 Moderate intellectual disabilities: Secondary | ICD-10-CM | POA: Diagnosis not present

## 2015-04-16 DIAGNOSIS — F71 Moderate intellectual disabilities: Secondary | ICD-10-CM | POA: Diagnosis not present

## 2015-04-17 DIAGNOSIS — F71 Moderate intellectual disabilities: Secondary | ICD-10-CM | POA: Diagnosis not present

## 2015-04-18 DIAGNOSIS — F71 Moderate intellectual disabilities: Secondary | ICD-10-CM | POA: Diagnosis not present

## 2015-04-19 DIAGNOSIS — F71 Moderate intellectual disabilities: Secondary | ICD-10-CM | POA: Diagnosis not present

## 2015-04-20 DIAGNOSIS — F71 Moderate intellectual disabilities: Secondary | ICD-10-CM | POA: Diagnosis not present

## 2015-04-21 DIAGNOSIS — F71 Moderate intellectual disabilities: Secondary | ICD-10-CM | POA: Diagnosis not present

## 2015-04-22 DIAGNOSIS — F71 Moderate intellectual disabilities: Secondary | ICD-10-CM | POA: Diagnosis not present

## 2015-04-23 DIAGNOSIS — F71 Moderate intellectual disabilities: Secondary | ICD-10-CM | POA: Diagnosis not present

## 2015-04-24 DIAGNOSIS — F71 Moderate intellectual disabilities: Secondary | ICD-10-CM | POA: Diagnosis not present

## 2015-04-25 DIAGNOSIS — F71 Moderate intellectual disabilities: Secondary | ICD-10-CM | POA: Diagnosis not present

## 2015-04-26 DIAGNOSIS — F71 Moderate intellectual disabilities: Secondary | ICD-10-CM | POA: Diagnosis not present

## 2015-04-27 DIAGNOSIS — F71 Moderate intellectual disabilities: Secondary | ICD-10-CM | POA: Diagnosis not present

## 2015-04-28 ENCOUNTER — Ambulatory Visit: Payer: Commercial Managed Care - HMO | Admitting: Podiatry

## 2015-04-28 DIAGNOSIS — F71 Moderate intellectual disabilities: Secondary | ICD-10-CM | POA: Diagnosis not present

## 2015-04-29 DIAGNOSIS — F71 Moderate intellectual disabilities: Secondary | ICD-10-CM | POA: Diagnosis not present

## 2015-04-30 DIAGNOSIS — F71 Moderate intellectual disabilities: Secondary | ICD-10-CM | POA: Diagnosis not present

## 2015-05-01 DIAGNOSIS — F71 Moderate intellectual disabilities: Secondary | ICD-10-CM | POA: Diagnosis not present

## 2015-05-02 DIAGNOSIS — F71 Moderate intellectual disabilities: Secondary | ICD-10-CM | POA: Diagnosis not present

## 2015-05-03 DIAGNOSIS — F71 Moderate intellectual disabilities: Secondary | ICD-10-CM | POA: Diagnosis not present

## 2015-05-04 DIAGNOSIS — F71 Moderate intellectual disabilities: Secondary | ICD-10-CM | POA: Diagnosis not present

## 2015-05-05 DIAGNOSIS — F71 Moderate intellectual disabilities: Secondary | ICD-10-CM | POA: Diagnosis not present

## 2015-05-06 DIAGNOSIS — F71 Moderate intellectual disabilities: Secondary | ICD-10-CM | POA: Diagnosis not present

## 2015-05-07 DIAGNOSIS — F71 Moderate intellectual disabilities: Secondary | ICD-10-CM | POA: Diagnosis not present

## 2015-05-08 ENCOUNTER — Encounter: Payer: Self-pay | Admitting: Podiatry

## 2015-05-08 ENCOUNTER — Ambulatory Visit (INDEPENDENT_AMBULATORY_CARE_PROVIDER_SITE_OTHER): Payer: Commercial Managed Care - HMO | Admitting: Podiatry

## 2015-05-08 DIAGNOSIS — M79673 Pain in unspecified foot: Secondary | ICD-10-CM

## 2015-05-08 DIAGNOSIS — Q828 Other specified congenital malformations of skin: Secondary | ICD-10-CM | POA: Diagnosis not present

## 2015-05-08 DIAGNOSIS — F71 Moderate intellectual disabilities: Secondary | ICD-10-CM | POA: Diagnosis not present

## 2015-05-08 DIAGNOSIS — B351 Tinea unguium: Secondary | ICD-10-CM | POA: Diagnosis not present

## 2015-05-08 NOTE — Progress Notes (Signed)
Subjective:     Patient ID: Arthur Weaver, male   DOB: 1960/02/28, 55 y.o.   MRN: [id number]  HPIThis patient presents to the office with painful long thick nails.  His nails are painful walking and wearing his shoes.  .  He also remarks that his right foot is flat when he walks and wears his shoes. He has painful callus both feet.   Review of Systems     Objective:   Physical Exam GENERAL APPEARANCE: Alert, conversant. Appropriately groomed. No acute distress.  VASCULAR: Pedal pulses absent  DP and PT bilateral.  Capillary refill time is immediate to all digits,   NEUROLOGIC: sensation is diminished  epicritically and protectively to 5.07 monofilament at 5/5 sites bilateral.  Light touch is intact bilateral.  MUSCULOSKELETAL: acceptable muscle strength, tone and stability bilateral.  Intrinsic muscluature intact bilateral.  Rectus appearance of foot and digits noted bilateral. Medial bulge noted at TNJ right foot. HAV B/L.  Hammer toes 2-4 B/L  DERMATOLOGIC: skin color, texture, and turgor are within normal limits.  No preulcerative lesions or ulcers  are seen, no interdigital maceration noted.  No open lesions present.. No drainage noted.  Pinch callus B/L.  Callus sub 1st B/L. Distal clavi 2,3 B/L  NAILS  Thick disfigured discolored nails both feet.     Assessment:     Onychomycosis    Diabetes Mellitus     Plan:     Debridement of Nails.  . Debridement of callus. RTC 3 months.     Gardiner Barefoot DPM

## 2015-05-09 DIAGNOSIS — F71 Moderate intellectual disabilities: Secondary | ICD-10-CM | POA: Diagnosis not present

## 2015-05-10 DIAGNOSIS — F71 Moderate intellectual disabilities: Secondary | ICD-10-CM | POA: Diagnosis not present

## 2015-05-11 DIAGNOSIS — F71 Moderate intellectual disabilities: Secondary | ICD-10-CM | POA: Diagnosis not present

## 2015-05-12 DIAGNOSIS — F71 Moderate intellectual disabilities: Secondary | ICD-10-CM | POA: Diagnosis not present

## 2015-05-13 DIAGNOSIS — F71 Moderate intellectual disabilities: Secondary | ICD-10-CM | POA: Diagnosis not present

## 2015-05-14 DIAGNOSIS — F71 Moderate intellectual disabilities: Secondary | ICD-10-CM | POA: Diagnosis not present

## 2015-05-15 DIAGNOSIS — F71 Moderate intellectual disabilities: Secondary | ICD-10-CM | POA: Diagnosis not present

## 2015-05-16 DIAGNOSIS — F71 Moderate intellectual disabilities: Secondary | ICD-10-CM | POA: Diagnosis not present

## 2015-05-17 DIAGNOSIS — F71 Moderate intellectual disabilities: Secondary | ICD-10-CM | POA: Diagnosis not present

## 2015-05-18 DIAGNOSIS — F71 Moderate intellectual disabilities: Secondary | ICD-10-CM | POA: Diagnosis not present

## 2015-05-19 ENCOUNTER — Ambulatory Visit (INDEPENDENT_AMBULATORY_CARE_PROVIDER_SITE_OTHER): Payer: Commercial Managed Care - HMO | Admitting: Podiatry

## 2015-05-19 DIAGNOSIS — F71 Moderate intellectual disabilities: Secondary | ICD-10-CM | POA: Diagnosis not present

## 2015-05-19 DIAGNOSIS — L84 Corns and callosities: Secondary | ICD-10-CM | POA: Diagnosis not present

## 2015-05-19 DIAGNOSIS — M2010 Hallux valgus (acquired), unspecified foot: Secondary | ICD-10-CM | POA: Diagnosis not present

## 2015-05-19 DIAGNOSIS — E1140 Type 2 diabetes mellitus with diabetic neuropathy, unspecified: Secondary | ICD-10-CM | POA: Diagnosis not present

## 2015-05-19 DIAGNOSIS — M2040 Other hammer toe(s) (acquired), unspecified foot: Secondary | ICD-10-CM | POA: Diagnosis not present

## 2015-05-19 NOTE — Progress Notes (Signed)
Patient ID: Arthur Weaver, male   DOB: 12-10-1960, 55 y.o.   MRN: [id number] Patient presents for diabetic shoe pick up, shoes are tried on for good fit.  Patient received 1 Pair Orthofeet [place] black in men's size 11 wide and 3 pairs custom molded diabetic inserts.  Verbal and written break in and wear instructions given.  Patient will follow up for scheduled routine care.        Physical Exam GENERAL APPEARANCE: Alert, conversant. Appropriately groomed. No acute distress.  VASCULAR: Pedal pulses absent DP and PT bilateral. Capillary refill time is immediate to all digits,  NEUROLOGIC: sensation is diminished epicritically and protectively to 5.07 monofilament at 5/5 sites bilateral. Light touch is intact bilateral.  MUSCULOSKELETAL: acceptable muscle strength, tone and stability bilateral. Intrinsic muscluature intact bilateral. Rectus appearance of foot and digits noted bilateral. Medial bulge noted at TNJ right foot. HAV B/L. Hammer toes 2-4 B/L  DERMATOLOGIC: skin color, texture, and turgor are within normal limits. No preulcerative lesions or ulcers are seen, no interdigital maceration noted. No open lesions present.. No drainage noted. Pinch callus B/L. Callus sub 1st B/L. Distal clavi 2,3 B/L  NAILS Thick disfigured discolored nails both feet.       Diabetes mellitus Hallux valgus  Hammer toe  Dispense shoes.  Patient presents today and was dispensed 0ne pair ( two units) of medically necessary extra depth shoes with three pair( six units) of custom molded multiple density inserts. The shoes and the inserts are fitted to the patients ' feet and are noted to fit well and are free of defect.  Length and width of the shoes are also acceptable.  Patient was given written and verbal  instructions for wearing.  If any concerns arrive with the shoes or inserts, the patient is to call the office.Patient is to follow up with doctor as needed.   Gardiner Barefoot DPM

## 2015-05-19 NOTE — Patient Instructions (Signed)

## 2015-05-20 DIAGNOSIS — F71 Moderate intellectual disabilities: Secondary | ICD-10-CM | POA: Diagnosis not present

## 2015-05-21 DIAGNOSIS — F71 Moderate intellectual disabilities: Secondary | ICD-10-CM | POA: Diagnosis not present

## 2015-05-22 DIAGNOSIS — F71 Moderate intellectual disabilities: Secondary | ICD-10-CM | POA: Diagnosis not present

## 2015-05-23 DIAGNOSIS — F71 Moderate intellectual disabilities: Secondary | ICD-10-CM | POA: Diagnosis not present

## 2015-05-24 DIAGNOSIS — F71 Moderate intellectual disabilities: Secondary | ICD-10-CM | POA: Diagnosis not present

## 2015-05-25 DIAGNOSIS — F71 Moderate intellectual disabilities: Secondary | ICD-10-CM | POA: Diagnosis not present

## 2015-05-26 DIAGNOSIS — F71 Moderate intellectual disabilities: Secondary | ICD-10-CM | POA: Diagnosis not present

## 2015-05-27 DIAGNOSIS — F71 Moderate intellectual disabilities: Secondary | ICD-10-CM | POA: Diagnosis not present

## 2015-05-28 DIAGNOSIS — F71 Moderate intellectual disabilities: Secondary | ICD-10-CM | POA: Diagnosis not present

## 2015-05-29 DIAGNOSIS — F71 Moderate intellectual disabilities: Secondary | ICD-10-CM | POA: Diagnosis not present

## 2015-05-30 DIAGNOSIS — F71 Moderate intellectual disabilities: Secondary | ICD-10-CM | POA: Diagnosis not present

## 2015-05-31 DIAGNOSIS — F71 Moderate intellectual disabilities: Secondary | ICD-10-CM | POA: Diagnosis not present

## 2015-06-01 DIAGNOSIS — F71 Moderate intellectual disabilities: Secondary | ICD-10-CM | POA: Diagnosis not present

## 2015-06-02 DIAGNOSIS — F71 Moderate intellectual disabilities: Secondary | ICD-10-CM | POA: Diagnosis not present

## 2015-06-03 DIAGNOSIS — F71 Moderate intellectual disabilities: Secondary | ICD-10-CM | POA: Diagnosis not present

## 2015-06-04 DIAGNOSIS — F71 Moderate intellectual disabilities: Secondary | ICD-10-CM | POA: Diagnosis not present

## 2015-06-05 DIAGNOSIS — F71 Moderate intellectual disabilities: Secondary | ICD-10-CM | POA: Diagnosis not present

## 2015-06-06 DIAGNOSIS — F71 Moderate intellectual disabilities: Secondary | ICD-10-CM | POA: Diagnosis not present

## 2015-06-07 DIAGNOSIS — F71 Moderate intellectual disabilities: Secondary | ICD-10-CM | POA: Diagnosis not present

## 2015-06-08 DIAGNOSIS — F71 Moderate intellectual disabilities: Secondary | ICD-10-CM | POA: Diagnosis not present

## 2015-06-09 ENCOUNTER — Encounter: Payer: Self-pay | Admitting: Internal Medicine

## 2015-06-09 DIAGNOSIS — F71 Moderate intellectual disabilities: Secondary | ICD-10-CM | POA: Diagnosis not present

## 2015-06-10 DIAGNOSIS — F71 Moderate intellectual disabilities: Secondary | ICD-10-CM | POA: Diagnosis not present

## 2015-06-11 DIAGNOSIS — F71 Moderate intellectual disabilities: Secondary | ICD-10-CM | POA: Diagnosis not present

## 2015-06-12 DIAGNOSIS — F71 Moderate intellectual disabilities: Secondary | ICD-10-CM | POA: Diagnosis not present

## 2015-06-13 DIAGNOSIS — F71 Moderate intellectual disabilities: Secondary | ICD-10-CM | POA: Diagnosis not present

## 2015-06-14 DIAGNOSIS — F71 Moderate intellectual disabilities: Secondary | ICD-10-CM | POA: Diagnosis not present

## 2015-06-15 DIAGNOSIS — F71 Moderate intellectual disabilities: Secondary | ICD-10-CM | POA: Diagnosis not present

## 2015-06-16 DIAGNOSIS — F71 Moderate intellectual disabilities: Secondary | ICD-10-CM | POA: Diagnosis not present

## 2015-06-17 DIAGNOSIS — F71 Moderate intellectual disabilities: Secondary | ICD-10-CM | POA: Diagnosis not present

## 2015-06-18 DIAGNOSIS — F71 Moderate intellectual disabilities: Secondary | ICD-10-CM | POA: Diagnosis not present

## 2015-06-19 DIAGNOSIS — F71 Moderate intellectual disabilities: Secondary | ICD-10-CM | POA: Diagnosis not present

## 2015-06-20 DIAGNOSIS — F71 Moderate intellectual disabilities: Secondary | ICD-10-CM | POA: Diagnosis not present

## 2015-06-21 DIAGNOSIS — F71 Moderate intellectual disabilities: Secondary | ICD-10-CM | POA: Diagnosis not present

## 2015-06-22 DIAGNOSIS — F71 Moderate intellectual disabilities: Secondary | ICD-10-CM | POA: Diagnosis not present

## 2015-06-23 DIAGNOSIS — F71 Moderate intellectual disabilities: Secondary | ICD-10-CM | POA: Diagnosis not present

## 2015-06-24 DIAGNOSIS — F71 Moderate intellectual disabilities: Secondary | ICD-10-CM | POA: Diagnosis not present

## 2015-06-25 DIAGNOSIS — F71 Moderate intellectual disabilities: Secondary | ICD-10-CM | POA: Diagnosis not present

## 2015-06-26 DIAGNOSIS — F71 Moderate intellectual disabilities: Secondary | ICD-10-CM | POA: Diagnosis not present

## 2015-06-27 DIAGNOSIS — F71 Moderate intellectual disabilities: Secondary | ICD-10-CM | POA: Diagnosis not present

## 2015-06-28 DIAGNOSIS — F71 Moderate intellectual disabilities: Secondary | ICD-10-CM | POA: Diagnosis not present

## 2015-06-29 DIAGNOSIS — F71 Moderate intellectual disabilities: Secondary | ICD-10-CM | POA: Diagnosis not present

## 2015-06-30 DIAGNOSIS — F71 Moderate intellectual disabilities: Secondary | ICD-10-CM | POA: Diagnosis not present

## 2015-07-01 DIAGNOSIS — F71 Moderate intellectual disabilities: Secondary | ICD-10-CM | POA: Diagnosis not present

## 2015-07-02 DIAGNOSIS — F71 Moderate intellectual disabilities: Secondary | ICD-10-CM | POA: Diagnosis not present

## 2015-07-03 DIAGNOSIS — F71 Moderate intellectual disabilities: Secondary | ICD-10-CM | POA: Diagnosis not present

## 2015-07-04 DIAGNOSIS — F71 Moderate intellectual disabilities: Secondary | ICD-10-CM | POA: Diagnosis not present

## 2015-07-05 DIAGNOSIS — F71 Moderate intellectual disabilities: Secondary | ICD-10-CM | POA: Diagnosis not present

## 2015-07-06 DIAGNOSIS — F71 Moderate intellectual disabilities: Secondary | ICD-10-CM | POA: Diagnosis not present

## 2015-07-07 DIAGNOSIS — F71 Moderate intellectual disabilities: Secondary | ICD-10-CM | POA: Diagnosis not present

## 2015-07-08 DIAGNOSIS — F71 Moderate intellectual disabilities: Secondary | ICD-10-CM | POA: Diagnosis not present

## 2015-07-09 DIAGNOSIS — F71 Moderate intellectual disabilities: Secondary | ICD-10-CM | POA: Diagnosis not present

## 2015-07-10 DIAGNOSIS — F71 Moderate intellectual disabilities: Secondary | ICD-10-CM | POA: Diagnosis not present

## 2015-07-11 DIAGNOSIS — F71 Moderate intellectual disabilities: Secondary | ICD-10-CM | POA: Diagnosis not present

## 2015-07-12 DIAGNOSIS — F71 Moderate intellectual disabilities: Secondary | ICD-10-CM | POA: Diagnosis not present

## 2015-07-13 DIAGNOSIS — F71 Moderate intellectual disabilities: Secondary | ICD-10-CM | POA: Diagnosis not present

## 2015-07-14 DIAGNOSIS — F71 Moderate intellectual disabilities: Secondary | ICD-10-CM | POA: Diagnosis not present

## 2015-07-15 DIAGNOSIS — F71 Moderate intellectual disabilities: Secondary | ICD-10-CM | POA: Diagnosis not present

## 2015-07-16 DIAGNOSIS — F71 Moderate intellectual disabilities: Secondary | ICD-10-CM | POA: Diagnosis not present

## 2015-07-17 DIAGNOSIS — F71 Moderate intellectual disabilities: Secondary | ICD-10-CM | POA: Diagnosis not present

## 2015-07-18 DIAGNOSIS — F71 Moderate intellectual disabilities: Secondary | ICD-10-CM | POA: Diagnosis not present

## 2015-07-19 DIAGNOSIS — F71 Moderate intellectual disabilities: Secondary | ICD-10-CM | POA: Diagnosis not present

## 2015-07-20 DIAGNOSIS — F71 Moderate intellectual disabilities: Secondary | ICD-10-CM | POA: Diagnosis not present

## 2015-07-21 DIAGNOSIS — F71 Moderate intellectual disabilities: Secondary | ICD-10-CM | POA: Diagnosis not present

## 2015-07-22 DIAGNOSIS — F71 Moderate intellectual disabilities: Secondary | ICD-10-CM | POA: Diagnosis not present

## 2015-07-23 DIAGNOSIS — F71 Moderate intellectual disabilities: Secondary | ICD-10-CM | POA: Diagnosis not present

## 2015-07-24 DIAGNOSIS — F71 Moderate intellectual disabilities: Secondary | ICD-10-CM | POA: Diagnosis not present

## 2015-07-25 DIAGNOSIS — F71 Moderate intellectual disabilities: Secondary | ICD-10-CM | POA: Diagnosis not present

## 2015-07-26 DIAGNOSIS — F71 Moderate intellectual disabilities: Secondary | ICD-10-CM | POA: Diagnosis not present

## 2015-07-27 DIAGNOSIS — F71 Moderate intellectual disabilities: Secondary | ICD-10-CM | POA: Diagnosis not present

## 2015-07-28 DIAGNOSIS — F71 Moderate intellectual disabilities: Secondary | ICD-10-CM | POA: Diagnosis not present

## 2015-07-29 DIAGNOSIS — F71 Moderate intellectual disabilities: Secondary | ICD-10-CM | POA: Diagnosis not present

## 2015-07-30 DIAGNOSIS — F71 Moderate intellectual disabilities: Secondary | ICD-10-CM | POA: Diagnosis not present

## 2015-07-31 DIAGNOSIS — F71 Moderate intellectual disabilities: Secondary | ICD-10-CM | POA: Diagnosis not present

## 2015-08-01 DIAGNOSIS — F71 Moderate intellectual disabilities: Secondary | ICD-10-CM | POA: Diagnosis not present

## 2015-08-02 DIAGNOSIS — F71 Moderate intellectual disabilities: Secondary | ICD-10-CM | POA: Diagnosis not present

## 2015-08-03 DIAGNOSIS — F71 Moderate intellectual disabilities: Secondary | ICD-10-CM | POA: Diagnosis not present

## 2015-08-04 DIAGNOSIS — F71 Moderate intellectual disabilities: Secondary | ICD-10-CM | POA: Diagnosis not present

## 2015-08-05 DIAGNOSIS — F71 Moderate intellectual disabilities: Secondary | ICD-10-CM | POA: Diagnosis not present

## 2015-08-06 DIAGNOSIS — F71 Moderate intellectual disabilities: Secondary | ICD-10-CM | POA: Diagnosis not present

## 2015-08-07 DIAGNOSIS — F71 Moderate intellectual disabilities: Secondary | ICD-10-CM | POA: Diagnosis not present

## 2015-08-08 DIAGNOSIS — F71 Moderate intellectual disabilities: Secondary | ICD-10-CM | POA: Diagnosis not present

## 2015-08-09 DIAGNOSIS — F71 Moderate intellectual disabilities: Secondary | ICD-10-CM | POA: Diagnosis not present

## 2015-08-10 DIAGNOSIS — F71 Moderate intellectual disabilities: Secondary | ICD-10-CM | POA: Diagnosis not present

## 2015-08-11 DIAGNOSIS — F71 Moderate intellectual disabilities: Secondary | ICD-10-CM | POA: Diagnosis not present

## 2015-08-12 DIAGNOSIS — F71 Moderate intellectual disabilities: Secondary | ICD-10-CM | POA: Diagnosis not present

## 2015-08-13 ENCOUNTER — Ambulatory Visit (AMBULATORY_SURGERY_CENTER): Payer: Self-pay

## 2015-08-13 VITALS — Ht 68.5 in | Wt 255.0 lb

## 2015-08-13 DIAGNOSIS — F71 Moderate intellectual disabilities: Secondary | ICD-10-CM | POA: Diagnosis not present

## 2015-08-13 DIAGNOSIS — Z6839 Body mass index (BMI) 39.0-39.9, adult: Secondary | ICD-10-CM | POA: Diagnosis not present

## 2015-08-13 DIAGNOSIS — M25471 Effusion, right ankle: Secondary | ICD-10-CM | POA: Diagnosis not present

## 2015-08-13 DIAGNOSIS — M79671 Pain in right foot: Secondary | ICD-10-CM | POA: Diagnosis not present

## 2015-08-13 DIAGNOSIS — R600 Localized edema: Secondary | ICD-10-CM | POA: Diagnosis not present

## 2015-08-13 DIAGNOSIS — Z860100 Personal history of colon polyps, unspecified: Secondary | ICD-10-CM

## 2015-08-13 DIAGNOSIS — M25571 Pain in right ankle and joints of right foot: Secondary | ICD-10-CM | POA: Diagnosis not present

## 2015-08-13 DIAGNOSIS — M7989 Other specified soft tissue disorders: Secondary | ICD-10-CM | POA: Diagnosis not present

## 2015-08-13 NOTE — Progress Notes (Signed)
No allergies to eggs or soy No past problems with anesthesia No diet meds No home oxygen  Has email and internet; declined emmi 

## 2015-08-14 DIAGNOSIS — F71 Moderate intellectual disabilities: Secondary | ICD-10-CM | POA: Diagnosis not present

## 2015-08-15 DIAGNOSIS — F71 Moderate intellectual disabilities: Secondary | ICD-10-CM | POA: Diagnosis not present

## 2015-08-16 DIAGNOSIS — F71 Moderate intellectual disabilities: Secondary | ICD-10-CM | POA: Diagnosis not present

## 2015-08-17 DIAGNOSIS — F71 Moderate intellectual disabilities: Secondary | ICD-10-CM | POA: Diagnosis not present

## 2015-08-18 DIAGNOSIS — F71 Moderate intellectual disabilities: Secondary | ICD-10-CM | POA: Diagnosis not present

## 2015-08-19 DIAGNOSIS — F71 Moderate intellectual disabilities: Secondary | ICD-10-CM | POA: Diagnosis not present

## 2015-08-20 DIAGNOSIS — F71 Moderate intellectual disabilities: Secondary | ICD-10-CM | POA: Diagnosis not present

## 2015-08-21 ENCOUNTER — Ambulatory Visit: Payer: Commercial Managed Care - HMO | Admitting: Podiatry

## 2015-08-21 DIAGNOSIS — F71 Moderate intellectual disabilities: Secondary | ICD-10-CM | POA: Diagnosis not present

## 2015-08-22 DIAGNOSIS — F71 Moderate intellectual disabilities: Secondary | ICD-10-CM | POA: Diagnosis not present

## 2015-08-24 DIAGNOSIS — N3941 Urge incontinence: Secondary | ICD-10-CM | POA: Diagnosis not present

## 2015-08-24 DIAGNOSIS — Z125 Encounter for screening for malignant neoplasm of prostate: Secondary | ICD-10-CM | POA: Diagnosis not present

## 2015-08-24 DIAGNOSIS — N401 Enlarged prostate with lower urinary tract symptoms: Secondary | ICD-10-CM | POA: Diagnosis not present

## 2015-08-24 DIAGNOSIS — E784 Other hyperlipidemia: Secondary | ICD-10-CM | POA: Diagnosis not present

## 2015-08-24 DIAGNOSIS — E559 Vitamin D deficiency, unspecified: Secondary | ICD-10-CM | POA: Diagnosis not present

## 2015-08-24 DIAGNOSIS — E119 Type 2 diabetes mellitus without complications: Secondary | ICD-10-CM | POA: Diagnosis not present

## 2015-08-24 DIAGNOSIS — R351 Nocturia: Secondary | ICD-10-CM | POA: Diagnosis not present

## 2015-08-24 DIAGNOSIS — R8299 Other abnormal findings in urine: Secondary | ICD-10-CM | POA: Diagnosis not present

## 2015-08-26 ENCOUNTER — Ambulatory Visit (AMBULATORY_SURGERY_CENTER): Payer: Commercial Managed Care - HMO | Admitting: Internal Medicine

## 2015-08-26 ENCOUNTER — Encounter: Payer: Self-pay | Admitting: Internal Medicine

## 2015-08-26 VITALS — BP 131/87 | HR 80 | Temp 98.4°F | Resp 21 | Ht 68.5 in | Wt 255.0 lb

## 2015-08-26 DIAGNOSIS — E669 Obesity, unspecified: Secondary | ICD-10-CM | POA: Diagnosis not present

## 2015-08-26 DIAGNOSIS — Z86010 Personal history of colonic polyps: Secondary | ICD-10-CM | POA: Diagnosis present

## 2015-08-26 DIAGNOSIS — F71 Moderate intellectual disabilities: Secondary | ICD-10-CM | POA: Diagnosis not present

## 2015-08-26 DIAGNOSIS — K219 Gastro-esophageal reflux disease without esophagitis: Secondary | ICD-10-CM | POA: Diagnosis not present

## 2015-08-26 DIAGNOSIS — I10 Essential (primary) hypertension: Secondary | ICD-10-CM | POA: Diagnosis not present

## 2015-08-26 DIAGNOSIS — E119 Type 2 diabetes mellitus without complications: Secondary | ICD-10-CM | POA: Diagnosis not present

## 2015-08-26 MED ORDER — SODIUM CHLORIDE 0.9 % IV SOLN: 500.0000 mL | INTRAVENOUS | Status: DC

## 2015-08-26 NOTE — Patient Instructions (Addendum)
No polyps!  Your next routine colonoscopy should be in 5 years - 2022.  YOU HAD AN ENDOSCOPIC PROCEDURE TODAY AT [facility]:   Refer to the procedure report that was given to you for any specific questions about what was found during the examination.  If the procedure report does not answer your questions, please call your gastroenterologist to clarify.  If you requested that your care partner not be given the details of your procedure findings, then the procedure report has been included in a sealed envelope for you to review at your convenience later.  YOU SHOULD EXPECT: Some feelings of bloating in the abdomen. Passage of more gas than usual.  Walking can help get rid of the air that was put into your GI tract during the procedure and reduce the bloating. If you had a lower endoscopy (such as a colonoscopy or flexible sigmoidoscopy) you may notice spotting of blood in your stool or on the toilet paper. If you underwent a bowel prep for your procedure, you may not have a normal bowel movement for a few days.  Please Note:  You might notice some irritation and congestion in your nose or some drainage.  This is from the oxygen used during your procedure.  There is no need for concern and it should clear up in a day or so.  SYMPTOMS TO REPORT IMMEDIATELY:   Following lower endoscopy (colonoscopy or flexible sigmoidoscopy):  Excessive amounts of blood in the stool  Significant tenderness or worsening of abdominal pains  Swelling of the abdomen that is new, acute  Fever of 100F or higher   Following upper endoscopy (EGD)  Vomiting of blood or coffee ground material  New chest pain or pain under the shoulder blades  Painful or persistently difficult swallowing  New shortness of breath  Fever of 100F or higher  Black, tarry-looking stools  For urgent or emergent issues, a gastroenterologist can be reached at any hour by calling [phone].   DIET: Your  first meal following the procedure should be a small meal and then it is ok to progress to your normal diet. Heavy or fried foods are harder to digest and may make you feel nauseous or bloated.  Likewise, meals heavy in dairy and vegetables can increase bloating.  Drink plenty of fluids but you should avoid alcoholic beverages for 24 hours.  ACTIVITY:  You should plan to take it easy for the rest of today and you should NOT DRIVE or use heavy machinery until tomorrow (because of the sedation medicines used during the test).    FOLLOW UP: Our staff will call the number listed on your records the next business day following your procedure to check on you and address any questions or concerns that you may have regarding the information given to you following your procedure. If we do not reach you, we will leave a message.  However, if you are feeling well and you are not experiencing any problems, there is no need to return our call.  We will assume that you have returned to your regular daily activities without incident.  If any biopsies were taken you will be contacted by phone or by letter within the next 1-3 weeks.  Please call us at [phone] if you have not heard about the biopsies in 3 weeks.    SIGNATURES/CONFIDENTIALITY: You and/or your care partner have signed paperwork which will be entered into your electronic medical record.  These signatures  attest to the fact that that the information above on your After Visit Summary has been reviewed and is understood.  Full responsibility of the confidentiality of this discharge information lies with you and/or your care-partner.   Polyp information given.

## 2015-08-26 NOTE — Op Note (Signed)
[facility] Patient Name: Arthur Weaver Procedure Date: 08/26/2015 8:48 AM MRN: [id number] Endoscopist: Gatha Mayer , MD Age: 55 Referring MD:  Date of Birth: 1961/01/27 Gender: Male Account #: [id number] Procedure:                Colonoscopy Indications:              Screening for colorectal malignant neoplasm Medicines:                Propofol per Anesthesia, Monitored Anesthesia Care Procedure:                Pre-Anesthesia Assessment:                           - Prior to the procedure, a History and Physical                            was performed, and patient medications and                            allergies were reviewed. The patient's tolerance of                            previous anesthesia was also reviewed. The risks                            and benefits of the procedure and the sedation                            options and risks were discussed with the patient.                            All questions were answered, and informed consent                            was obtained. Prior Anticoagulants: The patient has                            taken no previous anticoagulant or antiplatelet                            agents. ASA Grade Assessment: II - A patient with                            mild systemic disease. After reviewing the risks                            and benefits, the patient was deemed in                            satisfactory condition to undergo the procedure.                           After obtaining informed consent, the colonoscope  was passed under direct vision. Throughout the                            procedure, the patient's blood pressure, pulse, and                            oxygen saturations were monitored continuously. The                            Model CF-HQ190L [id number]) scope was introduced                            through the anus and advanced to the the cecum,       identified by appendiceal orifice and ileocecal                            valve. The ileocecal valve, appendiceal orifice,                            and rectum were photographed. The quality of the                            bowel preparation was good. The bowel preparation                            used was Miralax. Scope In: 8:57:50 AM Scope Out: 9:05:37 AM Scope Withdrawal Time: 0 hours 6 minutes 30 seconds  Total Procedure Duration: 0 hours 7 minutes 47 seconds  Findings:                 The perianal and digital rectal examinations were                            normal. Pertinent negatives include normal prostate                            (size, shape, and consistency).                           Diverticula were found in the sigmoid colon.                           The exam was otherwise without abnormality on                            direct and retroflexion views. Complications:            No immediate complications. Estimated blood loss:                            None. Estimated Blood Loss:     Estimated blood loss: none. Impression:               - Moderate diverticulosis in the sigmoid colon.                           -  The examination was otherwise normal on direct                            and retroflexion views.                           - No specimens collected.                           - Personal history of colonic polyps. 2006 and 2012                            - adenomas Recommendation:           - Repeat colonoscopy in 5 years for surveillance.                           - Resume previous diet.                           - Continue present medications.                           - Patient has a contact number available for                            emergencies. The signs and symptoms of potential                            delayed complications were discussed with the                            patient. Return to normal activities tomorrow.                             Written discharge instructions were provided to the                            patient. Gatha Mayer, MD 08/26/2015 9:10:14 AM This report has been signed electronically.

## 2015-08-26 NOTE — Progress Notes (Signed)
Stable to RR 

## 2015-08-27 ENCOUNTER — Telehealth: Payer: Self-pay

## 2015-08-27 DIAGNOSIS — F71 Moderate intellectual disabilities: Secondary | ICD-10-CM | POA: Diagnosis not present

## 2015-08-27 NOTE — Telephone Encounter (Signed)
Attempt post procedure courtesy call; no answer left voice mail message.

## 2015-08-28 ENCOUNTER — Encounter: Payer: Self-pay | Admitting: Podiatry

## 2015-08-28 ENCOUNTER — Ambulatory Visit (INDEPENDENT_AMBULATORY_CARE_PROVIDER_SITE_OTHER): Payer: Commercial Managed Care - HMO | Admitting: Podiatry

## 2015-08-28 DIAGNOSIS — N401 Enlarged prostate with lower urinary tract symptoms: Secondary | ICD-10-CM | POA: Diagnosis not present

## 2015-08-28 DIAGNOSIS — I10 Essential (primary) hypertension: Secondary | ICD-10-CM | POA: Diagnosis not present

## 2015-08-28 DIAGNOSIS — M129 Arthropathy, unspecified: Secondary | ICD-10-CM | POA: Diagnosis not present

## 2015-08-28 DIAGNOSIS — B351 Tinea unguium: Secondary | ICD-10-CM | POA: Diagnosis not present

## 2015-08-28 DIAGNOSIS — L84 Corns and callosities: Secondary | ICD-10-CM | POA: Diagnosis not present

## 2015-08-28 DIAGNOSIS — E668 Other obesity: Secondary | ICD-10-CM | POA: Diagnosis not present

## 2015-08-28 DIAGNOSIS — E559 Vitamin D deficiency, unspecified: Secondary | ICD-10-CM | POA: Diagnosis not present

## 2015-08-28 DIAGNOSIS — M25579 Pain in unspecified ankle and joints of unspecified foot: Secondary | ICD-10-CM | POA: Diagnosis not present

## 2015-08-28 DIAGNOSIS — K219 Gastro-esophageal reflux disease without esophagitis: Secondary | ICD-10-CM | POA: Diagnosis not present

## 2015-08-28 DIAGNOSIS — F71 Moderate intellectual disabilities: Secondary | ICD-10-CM | POA: Diagnosis not present

## 2015-08-28 DIAGNOSIS — M19079 Primary osteoarthritis, unspecified ankle and foot: Secondary | ICD-10-CM

## 2015-08-28 DIAGNOSIS — M79673 Pain in unspecified foot: Secondary | ICD-10-CM | POA: Diagnosis not present

## 2015-08-28 DIAGNOSIS — E784 Other hyperlipidemia: Secondary | ICD-10-CM | POA: Diagnosis not present

## 2015-08-28 DIAGNOSIS — Z0000 Encounter for general adult medical examination without abnormal findings: Secondary | ICD-10-CM | POA: Diagnosis not present

## 2015-08-28 DIAGNOSIS — I517 Cardiomegaly: Secondary | ICD-10-CM | POA: Diagnosis not present

## 2015-08-28 NOTE — Progress Notes (Signed)
Subjective:     Patient ID: Arthur Weaver, male   DOB: 1960/04/19, 55 y.o.   MRN: [id number]  HPIThis patient presents to the office with painful long thick nails.  His nails are painful walking and wearing his shoes.  .  He also remarks that his right foot is flat when he walks and wears his shoes. He has painful callus both feet.   Review of Systems     Objective:   Physical Exam GENERAL APPEARANCE: Alert, conversant. Appropriately groomed. No acute distress.  VASCULAR: Pedal pulses absent  DP and PT bilateral.  Capillary refill time is immediate to all digits,   NEUROLOGIC: sensation is diminished  epicritically and protectively to 5.07 monofilament at 5/5 sites bilateral.  Light touch is intact bilateral.  MUSCULOSKELETAL: acceptable muscle strength, tone and stability bilateral.  Intrinsic muscluature intact bilateral.  Rectus appearance of foot and digits noted bilateral. Medial bulge noted at TNJ right foot. HAV B/L.  Hammer toes 2-4 B/L.  Patient has developed enlarged bone at TNJ right foot.  Swelling noted right ankle.  DERMATOLOGIC: skin color, texture, and turgor are within normal limits.  No preulcerative lesions or ulcers  are seen, no interdigital maceration noted.  No open lesions present.. No drainage noted.  Pinch callus B/L.  Callus sub 1st B/L. Distal clavi 2,3 B/L  NAILS  Thick disfigured discolored nails both feet.     Assessment:     Onychomycosis    Diabetes Mellitus Ankle Arthropathy.    Plan:     Debridement of Nails.  . Debridement of callus. RTC 3 months for nail care.  Patient was dispensed cam walker to wear on right foot.  Patient will be rescheduled with Dr. Jacqualyn Posey next week.   Gardiner Barefoot DPM

## 2015-08-29 DIAGNOSIS — F71 Moderate intellectual disabilities: Secondary | ICD-10-CM | POA: Diagnosis not present

## 2015-08-30 DIAGNOSIS — F71 Moderate intellectual disabilities: Secondary | ICD-10-CM | POA: Diagnosis not present

## 2015-08-31 DIAGNOSIS — F71 Moderate intellectual disabilities: Secondary | ICD-10-CM | POA: Diagnosis not present

## 2015-09-01 DIAGNOSIS — F71 Moderate intellectual disabilities: Secondary | ICD-10-CM | POA: Diagnosis not present

## 2015-09-02 DIAGNOSIS — F71 Moderate intellectual disabilities: Secondary | ICD-10-CM | POA: Diagnosis not present

## 2015-09-03 DIAGNOSIS — M174 Other bilateral secondary osteoarthritis of knee: Secondary | ICD-10-CM | POA: Diagnosis not present

## 2015-09-03 DIAGNOSIS — M5137 Other intervertebral disc degeneration, lumbosacral region: Secondary | ICD-10-CM | POA: Diagnosis not present

## 2015-09-03 DIAGNOSIS — F71 Moderate intellectual disabilities: Secondary | ICD-10-CM | POA: Diagnosis not present

## 2015-09-03 DIAGNOSIS — E669 Obesity, unspecified: Secondary | ICD-10-CM | POA: Diagnosis not present

## 2015-09-03 DIAGNOSIS — M5030 Other cervical disc degeneration, unspecified cervical region: Secondary | ICD-10-CM | POA: Diagnosis not present

## 2015-09-04 ENCOUNTER — Ambulatory Visit: Payer: Commercial Managed Care - HMO

## 2015-09-04 ENCOUNTER — Ambulatory Visit (INDEPENDENT_AMBULATORY_CARE_PROVIDER_SITE_OTHER): Payer: Commercial Managed Care - HMO | Admitting: Podiatry

## 2015-09-04 ENCOUNTER — Telehealth: Payer: Self-pay | Admitting: *Deleted

## 2015-09-04 DIAGNOSIS — R52 Pain, unspecified: Secondary | ICD-10-CM

## 2015-09-04 DIAGNOSIS — M19071 Primary osteoarthritis, right ankle and foot: Secondary | ICD-10-CM

## 2015-09-04 DIAGNOSIS — M129 Arthropathy, unspecified: Secondary | ICD-10-CM | POA: Diagnosis not present

## 2015-09-04 DIAGNOSIS — F71 Moderate intellectual disabilities: Secondary | ICD-10-CM | POA: Diagnosis not present

## 2015-09-04 DIAGNOSIS — M25571 Pain in right ankle and joints of right foot: Secondary | ICD-10-CM

## 2015-09-04 DIAGNOSIS — M25471 Effusion, right ankle: Secondary | ICD-10-CM | POA: Diagnosis not present

## 2015-09-04 NOTE — Telephone Encounter (Addendum)
Dr. Jacqualyn Posey ordered CT Scan of Right foot. 09/07/2015-Faxed required [employer] form, orders, pt clinicals and demographics. [employer] APPROVED [place] [address] RIGHT FOOT, AUTHORIZATION #: [id number], VALID 09/07/2015 - 01/219/2018. FAXED TO [facility].

## 2015-09-05 DIAGNOSIS — F71 Moderate intellectual disabilities: Secondary | ICD-10-CM | POA: Diagnosis not present

## 2015-09-05 NOTE — Progress Notes (Signed)
Subjective: 55 year old male presents the also concerns right ankle swelling and pain which is been ongoing since the beginning of 2017. He went bowling that his foot caught on something and since has had chronic swelling is notices arch falling on the right side. He did have x-rays performed which she brings in today. He's been in a cam boot on the right side. He states he walks the cam boot makes his foot and ankle feel much better however when he goes of the digit he continues to get pain to the ankle. Denies any redness or warmth. Denies any systemic complaints such as fevers, chills, nausea, vomiting. No acute changes since last appointment, and no other complaints at this time.   Objective: AAO x3, NAD DP/PT pulses palpable bilaterally, CRT less than 3 seconds To both the medial and lateral aspect of the right ankle. There is tenderness diffusely along the ankle how there is no specific area pinpoint bony tenderness. Ankle, subtalar joint range of motion intact R subtalar joint range of motion is somewhat limited. There is a decrease in medial arch height upon weightbearing. No edema, erythema, increase in warmth to bilateral lower extremities.  No open lesions or pre-ulcerative lesions.  No pain with calf compression, swelling, warmth, erythema  Assessment:  Chronic right ankle pain as well as swelling  Plan: -All treatment options discussed with the patient including all alternatives, risks, complications.  -I reviewed the x-rays that he brought to the office today. No evidence of acute fracture. This time I recommended a CT scan of the ankle given the continued pain and swelling of the last several months. For now continue the cam boot. Continue ice and elevation. Limit weightbearing exercise. I'll follow up with him after the CT scan or to call sooner with any questions or concerns. -Patient encouraged to call the office with any questions, concerns, change in symptoms.   Celesta Gentile,  DPM

## 2015-09-06 DIAGNOSIS — F71 Moderate intellectual disabilities: Secondary | ICD-10-CM | POA: Diagnosis not present

## 2015-09-07 DIAGNOSIS — F71 Moderate intellectual disabilities: Secondary | ICD-10-CM | POA: Diagnosis not present

## 2015-09-08 DIAGNOSIS — F71 Moderate intellectual disabilities: Secondary | ICD-10-CM | POA: Diagnosis not present

## 2015-09-09 DIAGNOSIS — F71 Moderate intellectual disabilities: Secondary | ICD-10-CM | POA: Diagnosis not present

## 2015-09-10 DIAGNOSIS — F71 Moderate intellectual disabilities: Secondary | ICD-10-CM | POA: Diagnosis not present

## 2015-09-11 ENCOUNTER — Ambulatory Visit
Admission: RE | Admit: 2015-09-11 | Discharge: 2015-09-11 | Disposition: A | Discharge status: Home / Self Care | Payer: Commercial Managed Care - HMO | Source: Ambulatory Visit | Attending: Podiatry | Admitting: Podiatry

## 2015-09-11 DIAGNOSIS — M7989 Other specified soft tissue disorders: Secondary | ICD-10-CM | POA: Diagnosis not present

## 2015-09-11 DIAGNOSIS — F71 Moderate intellectual disabilities: Secondary | ICD-10-CM | POA: Diagnosis not present

## 2015-09-12 DIAGNOSIS — F71 Moderate intellectual disabilities: Secondary | ICD-10-CM | POA: Diagnosis not present

## 2015-09-13 DIAGNOSIS — F71 Moderate intellectual disabilities: Secondary | ICD-10-CM | POA: Diagnosis not present

## 2015-09-14 DIAGNOSIS — F71 Moderate intellectual disabilities: Secondary | ICD-10-CM | POA: Diagnosis not present

## 2015-09-15 DIAGNOSIS — F71 Moderate intellectual disabilities: Secondary | ICD-10-CM | POA: Diagnosis not present

## 2015-09-16 DIAGNOSIS — F71 Moderate intellectual disabilities: Secondary | ICD-10-CM | POA: Diagnosis not present

## 2015-09-17 DIAGNOSIS — F71 Moderate intellectual disabilities: Secondary | ICD-10-CM | POA: Diagnosis not present

## 2015-09-18 DIAGNOSIS — F71 Moderate intellectual disabilities: Secondary | ICD-10-CM | POA: Diagnosis not present

## 2015-09-19 DIAGNOSIS — F71 Moderate intellectual disabilities: Secondary | ICD-10-CM | POA: Diagnosis not present

## 2015-09-20 DIAGNOSIS — F71 Moderate intellectual disabilities: Secondary | ICD-10-CM | POA: Diagnosis not present

## 2015-09-21 ENCOUNTER — Encounter: Payer: Self-pay | Admitting: Podiatry

## 2015-09-21 ENCOUNTER — Ambulatory Visit (INDEPENDENT_AMBULATORY_CARE_PROVIDER_SITE_OTHER): Payer: Commercial Managed Care - HMO | Admitting: Podiatry

## 2015-09-21 DIAGNOSIS — G5750 Tarsal tunnel syndrome, unspecified lower limb: Secondary | ICD-10-CM

## 2015-09-21 DIAGNOSIS — M779 Enthesopathy, unspecified: Secondary | ICD-10-CM

## 2015-09-21 DIAGNOSIS — M129 Arthropathy, unspecified: Secondary | ICD-10-CM | POA: Diagnosis not present

## 2015-09-21 DIAGNOSIS — M19071 Primary osteoarthritis, right ankle and foot: Secondary | ICD-10-CM

## 2015-09-21 DIAGNOSIS — M25579 Pain in unspecified ankle and joints of unspecified foot: Secondary | ICD-10-CM

## 2015-09-21 DIAGNOSIS — F71 Moderate intellectual disabilities: Secondary | ICD-10-CM | POA: Diagnosis not present

## 2015-09-21 MED ORDER — IBUPROFEN 600 MG PO TABS: 600.0000 mg | ORAL_TABLET | Freq: Three times a day (TID) | ORAL | 0 refills | Status: DC | PRN

## 2015-09-22 DIAGNOSIS — F71 Moderate intellectual disabilities: Secondary | ICD-10-CM | POA: Diagnosis not present

## 2015-09-23 DIAGNOSIS — F71 Moderate intellectual disabilities: Secondary | ICD-10-CM | POA: Diagnosis not present

## 2015-09-24 DIAGNOSIS — F71 Moderate intellectual disabilities: Secondary | ICD-10-CM | POA: Diagnosis not present

## 2015-09-25 DIAGNOSIS — M779 Enthesopathy, unspecified: Secondary | ICD-10-CM | POA: Insufficient documentation

## 2015-09-25 DIAGNOSIS — M25579 Pain in unspecified ankle and joints of unspecified foot: Secondary | ICD-10-CM | POA: Insufficient documentation

## 2015-09-25 DIAGNOSIS — F71 Moderate intellectual disabilities: Secondary | ICD-10-CM | POA: Diagnosis not present

## 2015-09-25 NOTE — Progress Notes (Signed)
Subjective: 55 year old male presents the office today for follow-up evaluation in discussed CT scan results. He states as long as he wears the cam boot he does well. States of the right ankle still somewhat swollen. Denies any redness or warmth. Denies any systemic complaints such as fevers, chills, nausea, vomiting. No acute changes since last appointment, and no other complaints at this time.   Objective: AAO x3, NAD DP/PT pulses palpable bilaterally, CRT less than 3 seconds There is a decrease in medial arch of the right side worse than left. There is mild tenderness along the ankle joint how this appears to be significantly improved compared to what it was previously. Mild tennis on the medial ankle on the course of the flexor tendons. Decreased range of motion but the ankle and subtalar joint however there is no crepitation. There is no area pinpoint bony tenderness or pain the vibratory sensation. No open lesions or pre-ulcerative lesions.  No pain with calf compression, swelling, warmth, erythema  Assessment: Sinus tarsi syndrome, tendinitis  Plan: -All treatment options discussed with the patient including all alternatives, risks, complications.  -At this time he is scheduled to have a custom brace made for his ankle. I believe this is coming be beneficial for him. Wished off on steroid injection. -Continue cam boot for now. This is helping him a lot. -Continue ice to the area -Follow-up after the brace or sooner if any issues are to arise. Meantime call any questions, concerns or any change in symptoms.  Celesta Gentile, DPM

## 2015-09-26 DIAGNOSIS — F71 Moderate intellectual disabilities: Secondary | ICD-10-CM | POA: Diagnosis not present

## 2015-09-27 DIAGNOSIS — F71 Moderate intellectual disabilities: Secondary | ICD-10-CM | POA: Diagnosis not present

## 2015-09-28 DIAGNOSIS — F71 Moderate intellectual disabilities: Secondary | ICD-10-CM | POA: Diagnosis not present

## 2015-09-29 DIAGNOSIS — F71 Moderate intellectual disabilities: Secondary | ICD-10-CM | POA: Diagnosis not present

## 2015-09-30 DIAGNOSIS — F71 Moderate intellectual disabilities: Secondary | ICD-10-CM | POA: Diagnosis not present

## 2015-10-01 DIAGNOSIS — F71 Moderate intellectual disabilities: Secondary | ICD-10-CM | POA: Diagnosis not present

## 2015-10-02 DIAGNOSIS — F71 Moderate intellectual disabilities: Secondary | ICD-10-CM | POA: Diagnosis not present

## 2015-10-03 DIAGNOSIS — F71 Moderate intellectual disabilities: Secondary | ICD-10-CM | POA: Diagnosis not present

## 2015-10-04 DIAGNOSIS — F71 Moderate intellectual disabilities: Secondary | ICD-10-CM | POA: Diagnosis not present

## 2015-10-05 DIAGNOSIS — F71 Moderate intellectual disabilities: Secondary | ICD-10-CM | POA: Diagnosis not present

## 2015-10-06 DIAGNOSIS — F71 Moderate intellectual disabilities: Secondary | ICD-10-CM | POA: Diagnosis not present

## 2015-10-07 DIAGNOSIS — F71 Moderate intellectual disabilities: Secondary | ICD-10-CM | POA: Diagnosis not present

## 2015-10-08 DIAGNOSIS — F71 Moderate intellectual disabilities: Secondary | ICD-10-CM | POA: Diagnosis not present

## 2015-10-09 DIAGNOSIS — F71 Moderate intellectual disabilities: Secondary | ICD-10-CM | POA: Diagnosis not present

## 2015-10-10 DIAGNOSIS — F71 Moderate intellectual disabilities: Secondary | ICD-10-CM | POA: Diagnosis not present

## 2015-10-11 DIAGNOSIS — F71 Moderate intellectual disabilities: Secondary | ICD-10-CM | POA: Diagnosis not present

## 2015-10-12 DIAGNOSIS — F71 Moderate intellectual disabilities: Secondary | ICD-10-CM | POA: Diagnosis not present

## 2015-10-13 DIAGNOSIS — F71 Moderate intellectual disabilities: Secondary | ICD-10-CM | POA: Diagnosis not present

## 2015-10-14 DIAGNOSIS — F71 Moderate intellectual disabilities: Secondary | ICD-10-CM | POA: Diagnosis not present

## 2015-10-15 DIAGNOSIS — F71 Moderate intellectual disabilities: Secondary | ICD-10-CM | POA: Diagnosis not present

## 2015-10-16 DIAGNOSIS — F71 Moderate intellectual disabilities: Secondary | ICD-10-CM | POA: Diagnosis not present

## 2015-10-17 DIAGNOSIS — F71 Moderate intellectual disabilities: Secondary | ICD-10-CM | POA: Diagnosis not present

## 2015-10-18 DIAGNOSIS — F71 Moderate intellectual disabilities: Secondary | ICD-10-CM | POA: Diagnosis not present

## 2015-10-19 ENCOUNTER — Other Ambulatory Visit: Payer: Commercial Managed Care - HMO | Admitting: *Deleted

## 2015-10-19 DIAGNOSIS — F71 Moderate intellectual disabilities: Secondary | ICD-10-CM | POA: Diagnosis not present

## 2015-10-20 DIAGNOSIS — F71 Moderate intellectual disabilities: Secondary | ICD-10-CM | POA: Diagnosis not present

## 2015-10-21 DIAGNOSIS — F71 Moderate intellectual disabilities: Secondary | ICD-10-CM | POA: Diagnosis not present

## 2015-10-22 DIAGNOSIS — F71 Moderate intellectual disabilities: Secondary | ICD-10-CM | POA: Diagnosis not present

## 2015-10-23 DIAGNOSIS — F71 Moderate intellectual disabilities: Secondary | ICD-10-CM | POA: Diagnosis not present

## 2015-10-24 DIAGNOSIS — F71 Moderate intellectual disabilities: Secondary | ICD-10-CM | POA: Diagnosis not present

## 2015-10-25 DIAGNOSIS — F71 Moderate intellectual disabilities: Secondary | ICD-10-CM | POA: Diagnosis not present

## 2015-10-26 DIAGNOSIS — F71 Moderate intellectual disabilities: Secondary | ICD-10-CM | POA: Diagnosis not present

## 2015-10-27 DIAGNOSIS — F71 Moderate intellectual disabilities: Secondary | ICD-10-CM | POA: Diagnosis not present

## 2015-10-28 ENCOUNTER — Telehealth: Payer: Self-pay | Admitting: *Deleted

## 2015-10-28 DIAGNOSIS — F71 Moderate intellectual disabilities: Secondary | ICD-10-CM | POA: Diagnosis not present

## 2015-10-28 NOTE — Telephone Encounter (Addendum)
Pt's sister, [name] states pt is not able mentally to ask for Ibuprofen as needed, his care facility requires a hard copy with the Ibuprofen to be given 3 times daily. [name] to pick up in the [place] office. Dr. Jacqualyn Posey states write as 3x daily, not prn.  Done. Unable to leave message for [place] phone rang 1 1/2 mins without answering service. 10/29/2015-[name] states she is calling for the status of the Ibuprofen written rx.  I called the same phone number given 10/28/2015 and [name] answered, I informed her I had call yesterday and was unable to leave a message after the phone rang 1 1/2 minutes, but the rx was ready and she could pick it up in the [facility] today before 5:00pm. [name] states she will pick up today.

## 2015-10-28 NOTE — Telephone Encounter (Signed)
OK to do-

## 2015-10-29 DIAGNOSIS — F71 Moderate intellectual disabilities: Secondary | ICD-10-CM | POA: Diagnosis not present

## 2015-10-30 DIAGNOSIS — F71 Moderate intellectual disabilities: Secondary | ICD-10-CM | POA: Diagnosis not present

## 2015-10-31 DIAGNOSIS — F71 Moderate intellectual disabilities: Secondary | ICD-10-CM | POA: Diagnosis not present

## 2015-11-01 DIAGNOSIS — F71 Moderate intellectual disabilities: Secondary | ICD-10-CM | POA: Diagnosis not present

## 2015-11-02 DIAGNOSIS — F71 Moderate intellectual disabilities: Secondary | ICD-10-CM | POA: Diagnosis not present

## 2015-11-03 DIAGNOSIS — F71 Moderate intellectual disabilities: Secondary | ICD-10-CM | POA: Diagnosis not present

## 2015-11-04 DIAGNOSIS — F71 Moderate intellectual disabilities: Secondary | ICD-10-CM | POA: Diagnosis not present

## 2015-11-05 DIAGNOSIS — F71 Moderate intellectual disabilities: Secondary | ICD-10-CM | POA: Diagnosis not present

## 2015-11-06 DIAGNOSIS — F71 Moderate intellectual disabilities: Secondary | ICD-10-CM | POA: Diagnosis not present

## 2015-11-07 DIAGNOSIS — F71 Moderate intellectual disabilities: Secondary | ICD-10-CM | POA: Diagnosis not present

## 2015-11-08 DIAGNOSIS — F71 Moderate intellectual disabilities: Secondary | ICD-10-CM | POA: Diagnosis not present

## 2015-11-09 ENCOUNTER — Other Ambulatory Visit: Payer: Commercial Managed Care - HMO

## 2015-11-09 DIAGNOSIS — M7751 Other enthesopathy of right foot: Secondary | ICD-10-CM | POA: Diagnosis not present

## 2015-11-09 DIAGNOSIS — M25579 Pain in unspecified ankle and joints of unspecified foot: Secondary | ICD-10-CM | POA: Diagnosis not present

## 2015-11-09 DIAGNOSIS — F71 Moderate intellectual disabilities: Secondary | ICD-10-CM | POA: Diagnosis not present

## 2015-11-09 DIAGNOSIS — M1990 Unspecified osteoarthritis, unspecified site: Secondary | ICD-10-CM | POA: Diagnosis not present

## 2015-11-10 ENCOUNTER — Telehealth: Payer: Self-pay | Admitting: *Deleted

## 2015-11-10 DIAGNOSIS — F71 Moderate intellectual disabilities: Secondary | ICD-10-CM | POA: Diagnosis not present

## 2015-11-10 NOTE — Telephone Encounter (Signed)
I gave it to her yesterday. Did she call today?

## 2015-11-10 NOTE — Telephone Encounter (Signed)
Pt's caregiver states needs new orders for Ibuprofen.

## 2015-11-11 ENCOUNTER — Telehealth: Payer: Self-pay | Admitting: *Deleted

## 2015-11-11 DIAGNOSIS — F71 Moderate intellectual disabilities: Secondary | ICD-10-CM | POA: Diagnosis not present

## 2015-11-11 MED ORDER — IBUPROFEN 200 MG PO CAPS: 200.0000 mg | ORAL_CAPSULE | Freq: Three times a day (TID) | ORAL | 0 refills | Status: DC

## 2015-11-11 NOTE — Telephone Encounter (Signed)
Unable to get Ibuprofen orders to fax to [facility], faxed to alternate fax.

## 2015-11-11 NOTE — Telephone Encounter (Addendum)
-----   Message from Trula Slade, DPM sent at 11/11/2015  6:51 AM EDT ----- I gave him a written rx for ibupforen 200 mg TID ----- Message ----- From: Andres Ege, RN Sent: 11/10/2015   4:56 PM To: Trula Slade, DPM  Dr. Jacqualyn Posey, the care facility called states conflict in ibuprofen mg and # times a day, and I didn't see anything in the clinicals. Arthur Weaver. 11/11/2015-I spoke with [facility] and she requested a written rx for pt's order file. Ibuprofen order faxed [phone].

## 2015-11-12 DIAGNOSIS — F71 Moderate intellectual disabilities: Secondary | ICD-10-CM | POA: Diagnosis not present

## 2015-11-13 DIAGNOSIS — F71 Moderate intellectual disabilities: Secondary | ICD-10-CM | POA: Diagnosis not present

## 2015-11-14 DIAGNOSIS — F71 Moderate intellectual disabilities: Secondary | ICD-10-CM | POA: Diagnosis not present

## 2015-11-15 DIAGNOSIS — F71 Moderate intellectual disabilities: Secondary | ICD-10-CM | POA: Diagnosis not present

## 2015-11-16 DIAGNOSIS — F71 Moderate intellectual disabilities: Secondary | ICD-10-CM | POA: Diagnosis not present

## 2015-11-17 DIAGNOSIS — F71 Moderate intellectual disabilities: Secondary | ICD-10-CM | POA: Diagnosis not present

## 2015-11-18 DIAGNOSIS — F71 Moderate intellectual disabilities: Secondary | ICD-10-CM | POA: Diagnosis not present

## 2015-11-19 DIAGNOSIS — F71 Moderate intellectual disabilities: Secondary | ICD-10-CM | POA: Diagnosis not present

## 2015-11-20 DIAGNOSIS — F71 Moderate intellectual disabilities: Secondary | ICD-10-CM | POA: Diagnosis not present

## 2015-11-21 DIAGNOSIS — F71 Moderate intellectual disabilities: Secondary | ICD-10-CM | POA: Diagnosis not present

## 2015-11-22 DIAGNOSIS — F71 Moderate intellectual disabilities: Secondary | ICD-10-CM | POA: Diagnosis not present

## 2015-11-23 DIAGNOSIS — F71 Moderate intellectual disabilities: Secondary | ICD-10-CM | POA: Diagnosis not present

## 2015-11-24 DIAGNOSIS — F71 Moderate intellectual disabilities: Secondary | ICD-10-CM | POA: Diagnosis not present

## 2015-11-25 DIAGNOSIS — F71 Moderate intellectual disabilities: Secondary | ICD-10-CM | POA: Diagnosis not present

## 2015-11-26 DIAGNOSIS — F71 Moderate intellectual disabilities: Secondary | ICD-10-CM | POA: Diagnosis not present

## 2015-11-27 DIAGNOSIS — F71 Moderate intellectual disabilities: Secondary | ICD-10-CM | POA: Diagnosis not present

## 2015-11-28 DIAGNOSIS — F71 Moderate intellectual disabilities: Secondary | ICD-10-CM | POA: Diagnosis not present

## 2015-11-29 DIAGNOSIS — F71 Moderate intellectual disabilities: Secondary | ICD-10-CM | POA: Diagnosis not present

## 2015-11-30 DIAGNOSIS — F71 Moderate intellectual disabilities: Secondary | ICD-10-CM | POA: Diagnosis not present

## 2015-12-01 DIAGNOSIS — N401 Enlarged prostate with lower urinary tract symptoms: Secondary | ICD-10-CM | POA: Diagnosis not present

## 2015-12-01 DIAGNOSIS — I10 Essential (primary) hypertension: Secondary | ICD-10-CM | POA: Diagnosis not present

## 2015-12-01 DIAGNOSIS — E119 Type 2 diabetes mellitus without complications: Secondary | ICD-10-CM | POA: Diagnosis not present

## 2015-12-01 DIAGNOSIS — E784 Other hyperlipidemia: Secondary | ICD-10-CM | POA: Diagnosis not present

## 2015-12-01 DIAGNOSIS — F71 Moderate intellectual disabilities: Secondary | ICD-10-CM | POA: Diagnosis not present

## 2015-12-01 DIAGNOSIS — Z23 Encounter for immunization: Secondary | ICD-10-CM | POA: Diagnosis not present

## 2015-12-01 DIAGNOSIS — Z6838 Body mass index (BMI) 38.0-38.9, adult: Secondary | ICD-10-CM | POA: Diagnosis not present

## 2015-12-01 DIAGNOSIS — M25571 Pain in right ankle and joints of right foot: Secondary | ICD-10-CM | POA: Diagnosis not present

## 2015-12-01 DIAGNOSIS — K219 Gastro-esophageal reflux disease without esophagitis: Secondary | ICD-10-CM | POA: Diagnosis not present

## 2015-12-01 DIAGNOSIS — E559 Vitamin D deficiency, unspecified: Secondary | ICD-10-CM | POA: Diagnosis not present

## 2015-12-01 NOTE — Telephone Encounter (Signed)
OK to continue? 

## 2015-12-01 NOTE — Telephone Encounter (Addendum)
Pt's sister states his Ibuprofen order was written for 30 days, pt is still having arch, heel and ankle pain. 12/02/2015-DrJacqualyn Posey states refill Ibuprofen 200mg  one tablet 3 times daily #90, +3 refills. Faxed orders to [facility]. Left message informing pt's str, [name] that the rx had been faxed to [facility].

## 2015-12-02 DIAGNOSIS — F71 Moderate intellectual disabilities: Secondary | ICD-10-CM | POA: Diagnosis not present

## 2015-12-02 MED ORDER — IBUPROFEN 200 MG PO CAPS: 200.0000 mg | ORAL_CAPSULE | Freq: Three times a day (TID) | ORAL | 3 refills | Status: DC

## 2015-12-03 DIAGNOSIS — F71 Moderate intellectual disabilities: Secondary | ICD-10-CM | POA: Diagnosis not present

## 2015-12-04 DIAGNOSIS — F71 Moderate intellectual disabilities: Secondary | ICD-10-CM | POA: Diagnosis not present

## 2015-12-05 DIAGNOSIS — F71 Moderate intellectual disabilities: Secondary | ICD-10-CM | POA: Diagnosis not present

## 2015-12-06 DIAGNOSIS — F71 Moderate intellectual disabilities: Secondary | ICD-10-CM | POA: Diagnosis not present

## 2015-12-07 DIAGNOSIS — F71 Moderate intellectual disabilities: Secondary | ICD-10-CM | POA: Diagnosis not present

## 2015-12-08 DIAGNOSIS — F71 Moderate intellectual disabilities: Secondary | ICD-10-CM | POA: Diagnosis not present

## 2015-12-09 DIAGNOSIS — F71 Moderate intellectual disabilities: Secondary | ICD-10-CM | POA: Diagnosis not present

## 2015-12-10 DIAGNOSIS — F71 Moderate intellectual disabilities: Secondary | ICD-10-CM | POA: Diagnosis not present

## 2015-12-11 DIAGNOSIS — F71 Moderate intellectual disabilities: Secondary | ICD-10-CM | POA: Diagnosis not present

## 2015-12-12 DIAGNOSIS — F71 Moderate intellectual disabilities: Secondary | ICD-10-CM | POA: Diagnosis not present

## 2015-12-13 DIAGNOSIS — F71 Moderate intellectual disabilities: Secondary | ICD-10-CM | POA: Diagnosis not present

## 2015-12-14 DIAGNOSIS — F71 Moderate intellectual disabilities: Secondary | ICD-10-CM | POA: Diagnosis not present

## 2015-12-15 DIAGNOSIS — F71 Moderate intellectual disabilities: Secondary | ICD-10-CM | POA: Diagnosis not present

## 2015-12-16 DIAGNOSIS — F71 Moderate intellectual disabilities: Secondary | ICD-10-CM | POA: Diagnosis not present

## 2015-12-17 DIAGNOSIS — F71 Moderate intellectual disabilities: Secondary | ICD-10-CM | POA: Diagnosis not present

## 2015-12-18 DIAGNOSIS — F71 Moderate intellectual disabilities: Secondary | ICD-10-CM | POA: Diagnosis not present

## 2015-12-19 DIAGNOSIS — F71 Moderate intellectual disabilities: Secondary | ICD-10-CM | POA: Diagnosis not present

## 2015-12-20 DIAGNOSIS — F71 Moderate intellectual disabilities: Secondary | ICD-10-CM | POA: Diagnosis not present

## 2015-12-21 ENCOUNTER — Ambulatory Visit: Payer: Commercial Managed Care - HMO | Admitting: Podiatry

## 2015-12-21 DIAGNOSIS — F71 Moderate intellectual disabilities: Secondary | ICD-10-CM | POA: Diagnosis not present

## 2015-12-22 DIAGNOSIS — F71 Moderate intellectual disabilities: Secondary | ICD-10-CM | POA: Diagnosis not present

## 2015-12-23 DIAGNOSIS — F71 Moderate intellectual disabilities: Secondary | ICD-10-CM | POA: Diagnosis not present

## 2015-12-24 DIAGNOSIS — F71 Moderate intellectual disabilities: Secondary | ICD-10-CM | POA: Diagnosis not present

## 2015-12-25 DIAGNOSIS — F71 Moderate intellectual disabilities: Secondary | ICD-10-CM | POA: Diagnosis not present

## 2015-12-26 DIAGNOSIS — F71 Moderate intellectual disabilities: Secondary | ICD-10-CM | POA: Diagnosis not present

## 2015-12-27 DIAGNOSIS — F71 Moderate intellectual disabilities: Secondary | ICD-10-CM | POA: Diagnosis not present

## 2015-12-28 ENCOUNTER — Encounter: Payer: Self-pay | Admitting: Podiatry

## 2015-12-28 ENCOUNTER — Ambulatory Visit (INDEPENDENT_AMBULATORY_CARE_PROVIDER_SITE_OTHER): Payer: Commercial Managed Care - HMO | Admitting: Podiatry

## 2015-12-28 DIAGNOSIS — M779 Enthesopathy, unspecified: Secondary | ICD-10-CM | POA: Diagnosis not present

## 2015-12-28 DIAGNOSIS — M79675 Pain in left toe(s): Secondary | ICD-10-CM

## 2015-12-28 DIAGNOSIS — B351 Tinea unguium: Secondary | ICD-10-CM

## 2015-12-28 DIAGNOSIS — M79674 Pain in right toe(s): Secondary | ICD-10-CM | POA: Diagnosis not present

## 2015-12-28 DIAGNOSIS — L84 Corns and callosities: Secondary | ICD-10-CM

## 2015-12-28 DIAGNOSIS — M19071 Primary osteoarthritis, right ankle and foot: Secondary | ICD-10-CM | POA: Diagnosis not present

## 2015-12-28 DIAGNOSIS — F71 Moderate intellectual disabilities: Secondary | ICD-10-CM | POA: Diagnosis not present

## 2015-12-28 NOTE — Progress Notes (Signed)
Subjective: 55 year old male presents the office today for follow-up evaluation of right flatfoot after getting his brace. He states the brace is doing well not causing any problems. He is getting some discomfort of the arch of his foot and he points to the medial arch that over a year and some discomfort. He does continue with ibuprofen which is helping. Denies any recent injury or trauma. No swelling or redness.  Also presents today for thick, painful, elongated toenails as well as calluses to both of his feet. Denies any redness or drainage or any swelling.  Denies any systemic complaints such as fevers, chills, nausea, vomiting. No acute changes since last appointment, and no other complaints at this time.   Objective: AAO x3, NAD DP/PT pulses palpable bilaterally, CRT less than 3 seconds This is significant decrease in medial arch on the right foot. There is subjective tenderness on the medial band plantar fascia within the arch of the foot however he is not having any pain today. There is decreased abductor range of motion as well as ankle joint range of motion. There is no specific area tenderness bilaterally today. There is no overlying edema, erythema, increase in warmth. [place] brace appears to be fitting well not causing any blistering or skin irritation. Nails are hypertrophic, dystrophic, brittle, discolored, elongated 10. No surrounding redness or drainage. Tenderness nails 1-5 bilaterally. The keratotic lesions to bilateral hallux as well as submetatarsal. Upon debridement no underlying ulceration, drainage or any signs of infection.  No open lesions or pre-ulcerative lesions are identified today. No open lesions or pre-ulcerative lesions.  No pain with calf compression, swelling, warmth, erythema  Assessment: Right tendinitis, osteoarthritis flatfoot; symptomatically onychomycosis and hyperkeratotic lesions  Plan: -All treatment options discussed with the patient including all  alternatives, risks, complications.  -Continue with [place] brace as well as supportive shoe gear. He has Voltaren gel already and recommended this to his foot daily.  -Nails debrided 10 without complications or bleeding hyperkeratotic lesions debrided 4 without complications or bleeding -Daily foot inspection -Follow-up in 3 months or sooner if needed. -Patient encouraged to call the office with any questions, concerns, change in symptoms.   Celesta Gentile, DPM

## 2015-12-29 DIAGNOSIS — F71 Moderate intellectual disabilities: Secondary | ICD-10-CM | POA: Diagnosis not present

## 2015-12-30 DIAGNOSIS — F71 Moderate intellectual disabilities: Secondary | ICD-10-CM | POA: Diagnosis not present

## 2016-01-01 DIAGNOSIS — F71 Moderate intellectual disabilities: Secondary | ICD-10-CM | POA: Diagnosis not present

## 2016-01-02 DIAGNOSIS — F71 Moderate intellectual disabilities: Secondary | ICD-10-CM | POA: Diagnosis not present

## 2016-01-03 DIAGNOSIS — F71 Moderate intellectual disabilities: Secondary | ICD-10-CM | POA: Diagnosis not present

## 2016-01-04 ENCOUNTER — Telehealth: Payer: Self-pay | Admitting: *Deleted

## 2016-01-04 DIAGNOSIS — F71 Moderate intellectual disabilities: Secondary | ICD-10-CM | POA: Diagnosis not present

## 2016-01-04 NOTE — Telephone Encounter (Addendum)
Claiborne Billings - [facility] called for update of Ibuprofen orders. I reviewed orders and telephone communications and I faxed orders 12/02/2015 to [facility] for Ibuprofen 200mg  #90 1 tablet tid +3refills. Left message informing Claiborne Billings - [facility] orders had been faxed and that she could use my current voicemail as a order. 01/05/2016-Pt's str, [name] states pt lives in a group home [facility] and Dr. Jacqualyn Posey ordered pt to use Voltaren gel to his right arch daily, [facility] needs d/c order for the Ibuprofen and Aleve. Dr. Jacqualyn Posey states pt is to use the Voltaren 1% Gel and d/c Ibuprofen and Aleve. 01/06/2016-Nurse Claiborne Billings states Nurse G will be out of the office for a while, please send orders for the Voltaren Gel. Dr. Jacqualyn Posey states apply Voltaren gel to right arch bid. Orders faxed the [facility]. 01/11/2016-Pt's sister, [name] states the [facility] is still giving pt Aleve or Ibuprofen depending on the staff, please send a order to stop Aleve and Ibuprofen, apply Voltaren Gel 1/4 inch to right plantar arch. Claiborne Billings - [facility] states they need an order to stop Aleve and Ibuprofen and orders for Voltaren. Faxed orders to d/c Aleve and Ibuprofen and use Voltaren gel 1/4 inch 2 times a day to right plantar arch to [facility].

## 2016-01-05 DIAGNOSIS — F71 Moderate intellectual disabilities: Secondary | ICD-10-CM | POA: Diagnosis not present

## 2016-01-06 DIAGNOSIS — F71 Moderate intellectual disabilities: Secondary | ICD-10-CM | POA: Diagnosis not present

## 2016-01-07 DIAGNOSIS — F71 Moderate intellectual disabilities: Secondary | ICD-10-CM | POA: Diagnosis not present

## 2016-01-08 DIAGNOSIS — F71 Moderate intellectual disabilities: Secondary | ICD-10-CM | POA: Diagnosis not present

## 2016-01-09 DIAGNOSIS — F71 Moderate intellectual disabilities: Secondary | ICD-10-CM | POA: Diagnosis not present

## 2016-01-10 DIAGNOSIS — F71 Moderate intellectual disabilities: Secondary | ICD-10-CM | POA: Diagnosis not present

## 2016-01-11 DIAGNOSIS — F71 Moderate intellectual disabilities: Secondary | ICD-10-CM | POA: Diagnosis not present

## 2016-01-12 DIAGNOSIS — F71 Moderate intellectual disabilities: Secondary | ICD-10-CM | POA: Diagnosis not present

## 2016-01-13 DIAGNOSIS — F71 Moderate intellectual disabilities: Secondary | ICD-10-CM | POA: Diagnosis not present

## 2016-01-17 DIAGNOSIS — F71 Moderate intellectual disabilities: Secondary | ICD-10-CM | POA: Diagnosis not present

## 2016-01-18 DIAGNOSIS — F71 Moderate intellectual disabilities: Secondary | ICD-10-CM | POA: Diagnosis not present

## 2016-01-19 DIAGNOSIS — F71 Moderate intellectual disabilities: Secondary | ICD-10-CM | POA: Diagnosis not present

## 2016-01-20 DIAGNOSIS — F71 Moderate intellectual disabilities: Secondary | ICD-10-CM | POA: Diagnosis not present

## 2016-01-21 DIAGNOSIS — F71 Moderate intellectual disabilities: Secondary | ICD-10-CM | POA: Diagnosis not present

## 2016-01-22 DIAGNOSIS — F71 Moderate intellectual disabilities: Secondary | ICD-10-CM | POA: Diagnosis not present

## 2016-01-23 DIAGNOSIS — F71 Moderate intellectual disabilities: Secondary | ICD-10-CM | POA: Diagnosis not present

## 2016-01-24 DIAGNOSIS — F71 Moderate intellectual disabilities: Secondary | ICD-10-CM | POA: Diagnosis not present

## 2016-01-25 DIAGNOSIS — F71 Moderate intellectual disabilities: Secondary | ICD-10-CM | POA: Diagnosis not present

## 2016-01-26 DIAGNOSIS — F71 Moderate intellectual disabilities: Secondary | ICD-10-CM | POA: Diagnosis not present

## 2016-01-27 DIAGNOSIS — F71 Moderate intellectual disabilities: Secondary | ICD-10-CM | POA: Diagnosis not present

## 2016-01-28 DIAGNOSIS — F71 Moderate intellectual disabilities: Secondary | ICD-10-CM | POA: Diagnosis not present

## 2016-01-29 DIAGNOSIS — F71 Moderate intellectual disabilities: Secondary | ICD-10-CM | POA: Diagnosis not present

## 2016-01-30 DIAGNOSIS — F71 Moderate intellectual disabilities: Secondary | ICD-10-CM | POA: Diagnosis not present

## 2016-02-01 DIAGNOSIS — F71 Moderate intellectual disabilities: Secondary | ICD-10-CM | POA: Diagnosis not present

## 2016-02-02 DIAGNOSIS — F71 Moderate intellectual disabilities: Secondary | ICD-10-CM | POA: Diagnosis not present

## 2016-02-03 DIAGNOSIS — F71 Moderate intellectual disabilities: Secondary | ICD-10-CM | POA: Diagnosis not present

## 2016-02-04 DIAGNOSIS — F71 Moderate intellectual disabilities: Secondary | ICD-10-CM | POA: Diagnosis not present

## 2016-02-05 DIAGNOSIS — F71 Moderate intellectual disabilities: Secondary | ICD-10-CM | POA: Diagnosis not present

## 2016-02-06 DIAGNOSIS — F71 Moderate intellectual disabilities: Secondary | ICD-10-CM | POA: Diagnosis not present

## 2016-02-07 DIAGNOSIS — F71 Moderate intellectual disabilities: Secondary | ICD-10-CM | POA: Diagnosis not present

## 2016-02-08 DIAGNOSIS — F71 Moderate intellectual disabilities: Secondary | ICD-10-CM | POA: Diagnosis not present

## 2016-02-09 DIAGNOSIS — F71 Moderate intellectual disabilities: Secondary | ICD-10-CM | POA: Diagnosis not present

## 2016-02-10 DIAGNOSIS — F71 Moderate intellectual disabilities: Secondary | ICD-10-CM | POA: Diagnosis not present

## 2016-02-11 DIAGNOSIS — F71 Moderate intellectual disabilities: Secondary | ICD-10-CM | POA: Diagnosis not present

## 2016-02-12 DIAGNOSIS — F71 Moderate intellectual disabilities: Secondary | ICD-10-CM | POA: Diagnosis not present

## 2016-02-13 DIAGNOSIS — F71 Moderate intellectual disabilities: Secondary | ICD-10-CM | POA: Diagnosis not present

## 2016-02-14 DIAGNOSIS — F71 Moderate intellectual disabilities: Secondary | ICD-10-CM | POA: Diagnosis not present

## 2016-02-15 DIAGNOSIS — F71 Moderate intellectual disabilities: Secondary | ICD-10-CM | POA: Diagnosis not present

## 2016-02-16 DIAGNOSIS — F71 Moderate intellectual disabilities: Secondary | ICD-10-CM | POA: Diagnosis not present

## 2016-02-17 DIAGNOSIS — F71 Moderate intellectual disabilities: Secondary | ICD-10-CM | POA: Diagnosis not present

## 2016-02-17 DIAGNOSIS — N401 Enlarged prostate with lower urinary tract symptoms: Secondary | ICD-10-CM | POA: Diagnosis not present

## 2016-02-17 DIAGNOSIS — N3941 Urge incontinence: Secondary | ICD-10-CM | POA: Diagnosis not present

## 2016-02-18 DIAGNOSIS — F71 Moderate intellectual disabilities: Secondary | ICD-10-CM | POA: Diagnosis not present

## 2016-02-19 DIAGNOSIS — E119 Type 2 diabetes mellitus without complications: Secondary | ICD-10-CM | POA: Diagnosis not present

## 2016-02-19 DIAGNOSIS — F71 Moderate intellectual disabilities: Secondary | ICD-10-CM | POA: Diagnosis not present

## 2016-02-20 DIAGNOSIS — F71 Moderate intellectual disabilities: Secondary | ICD-10-CM | POA: Diagnosis not present

## 2016-02-21 DIAGNOSIS — F71 Moderate intellectual disabilities: Secondary | ICD-10-CM | POA: Diagnosis not present

## 2016-02-22 DIAGNOSIS — F71 Moderate intellectual disabilities: Secondary | ICD-10-CM | POA: Diagnosis not present

## 2016-02-23 DIAGNOSIS — F71 Moderate intellectual disabilities: Secondary | ICD-10-CM | POA: Diagnosis not present

## 2016-02-24 DIAGNOSIS — F71 Moderate intellectual disabilities: Secondary | ICD-10-CM | POA: Diagnosis not present

## 2016-02-25 DIAGNOSIS — F71 Moderate intellectual disabilities: Secondary | ICD-10-CM | POA: Diagnosis not present

## 2016-02-26 DIAGNOSIS — F71 Moderate intellectual disabilities: Secondary | ICD-10-CM | POA: Diagnosis not present

## 2016-02-27 DIAGNOSIS — F71 Moderate intellectual disabilities: Secondary | ICD-10-CM | POA: Diagnosis not present

## 2016-02-28 DIAGNOSIS — F71 Moderate intellectual disabilities: Secondary | ICD-10-CM | POA: Diagnosis not present

## 2016-02-29 DIAGNOSIS — F71 Moderate intellectual disabilities: Secondary | ICD-10-CM | POA: Diagnosis not present

## 2016-03-01 DIAGNOSIS — F71 Moderate intellectual disabilities: Secondary | ICD-10-CM | POA: Diagnosis not present

## 2016-03-02 DIAGNOSIS — Z6837 Body mass index (BMI) 37.0-37.9, adult: Secondary | ICD-10-CM | POA: Diagnosis not present

## 2016-03-02 DIAGNOSIS — I10 Essential (primary) hypertension: Secondary | ICD-10-CM | POA: Diagnosis not present

## 2016-03-02 DIAGNOSIS — M25579 Pain in unspecified ankle and joints of unspecified foot: Secondary | ICD-10-CM | POA: Diagnosis not present

## 2016-03-02 DIAGNOSIS — F71 Moderate intellectual disabilities: Secondary | ICD-10-CM | POA: Diagnosis not present

## 2016-03-02 DIAGNOSIS — N401 Enlarged prostate with lower urinary tract symptoms: Secondary | ICD-10-CM | POA: Diagnosis not present

## 2016-03-02 DIAGNOSIS — E119 Type 2 diabetes mellitus without complications: Secondary | ICD-10-CM | POA: Diagnosis not present

## 2016-03-03 DIAGNOSIS — F71 Moderate intellectual disabilities: Secondary | ICD-10-CM | POA: Diagnosis not present

## 2016-03-04 DIAGNOSIS — F71 Moderate intellectual disabilities: Secondary | ICD-10-CM | POA: Diagnosis not present

## 2016-03-05 DIAGNOSIS — F71 Moderate intellectual disabilities: Secondary | ICD-10-CM | POA: Diagnosis not present

## 2016-03-06 DIAGNOSIS — F71 Moderate intellectual disabilities: Secondary | ICD-10-CM | POA: Diagnosis not present

## 2016-03-07 DIAGNOSIS — F71 Moderate intellectual disabilities: Secondary | ICD-10-CM | POA: Diagnosis not present

## 2016-03-08 ENCOUNTER — Encounter: Payer: Self-pay | Admitting: Rheumatology

## 2016-03-08 ENCOUNTER — Ambulatory Visit (INDEPENDENT_AMBULATORY_CARE_PROVIDER_SITE_OTHER): Payer: Medicare HMO | Admitting: Rheumatology

## 2016-03-08 VITALS — BP 153/85 | HR 97 | Resp 16 | Ht 70.0 in | Wt 249.0 lb

## 2016-03-08 DIAGNOSIS — M170 Bilateral primary osteoarthritis of knee: Secondary | ICD-10-CM

## 2016-03-08 DIAGNOSIS — M47816 Spondylosis without myelopathy or radiculopathy, lumbar region: Secondary | ICD-10-CM

## 2016-03-08 DIAGNOSIS — M5030 Other cervical disc degeneration, unspecified cervical region: Secondary | ICD-10-CM | POA: Diagnosis not present

## 2016-03-08 DIAGNOSIS — M19042 Primary osteoarthritis, left hand: Secondary | ICD-10-CM | POA: Diagnosis not present

## 2016-03-08 DIAGNOSIS — M19041 Primary osteoarthritis, right hand: Secondary | ICD-10-CM

## 2016-03-08 DIAGNOSIS — E6609 Other obesity due to excess calories: Secondary | ICD-10-CM | POA: Diagnosis not present

## 2016-03-08 DIAGNOSIS — F71 Moderate intellectual disabilities: Secondary | ICD-10-CM | POA: Diagnosis not present

## 2016-03-08 DIAGNOSIS — M47812 Spondylosis without myelopathy or radiculopathy, cervical region: Secondary | ICD-10-CM

## 2016-03-08 DIAGNOSIS — Z6839 Body mass index (BMI) 39.0-39.9, adult: Secondary | ICD-10-CM

## 2016-03-08 NOTE — Progress Notes (Signed)
Office Visit Note  Patient: Arthur Weaver             Date of Birth: 1960-07-24           MRN: [id number]             PCP: Velna Hatchet, MD Referring: Velna Hatchet, MD Visit Date: 03/08/2016 Occupation: _0 @    Subjective:  Follow-up DDD of the C and L-spine, OA of the hands and knees  History of Present Illness: Arthur Weaver is a 56 y.o. male  Last seen 09/03/2015. Patient, through his sister neck:, States that he is doing well.  No change in his symptoms and some last visit.  Patient is attempting weight. On his last visit in July 2017, he was 250. (He had gone up 5 pounds the previous visit). Today, with weight loss efforts, he feels that he is lost some weight.  He currently has a new diagnosis of enlarged prostate. According to his sister, they're getting this evaluated and treated through PCP and surgeon.  I neck also states that patient is not able to drink more than one bottle of water while he is at his "center". She is requesting me to write on his attending form to allow him to drink more water. I'm agreeable and I'll be happy to do so. Patient would benefit from having 8 ounces 8 glasses or bottles per day  Activities of Daily Living:  Patient reports morning stiffness for 15 minutes.   Patient Denies nocturnal pain.  Difficulty dressing/grooming: Denies Difficulty climbing stairs: Denies Difficulty getting out of chair: Denies Difficulty using hands for taps, buttons, cutlery, and/or writing: Denies   Review of Systems  Constitutional: Negative for fatigue.  HENT: Negative for mouth sores and mouth dryness.   Eyes: Negative for dryness.  Respiratory: Negative for shortness of breath.   Gastrointestinal: Negative for constipation and diarrhea.  Musculoskeletal: Negative for myalgias and myalgias.  Skin: Negative for sensitivity to sunlight.  Neurological: Negative for memory loss.  Psychiatric/Behavioral: Negative for sleep disturbance.     PMFS History:  Patient Active Problem List   Diagnosis Date Noted  . 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 Tendonitis 09/25/2015  . High blood pressure 09/07/2010  . High cholesterol 09/07/2010  . Bilateral swelling of feet 09/07/2010  . Diabetes mellitus 09/07/2010  . Rheumatoid arthritis(714.0) 09/07/2010  . Personal history of colonic polyps 10/27/2004    Past Medical History:  Diagnosis Date  . Arthritis   . Bilateral swelling of feet   . Enlarged prostate   . GERD (gastroesophageal reflux disease)   . High blood pressure   . High cholesterol   . Personal history of colonic polyps 10/27/2004  . Prediabetes     Family History  Problem Relation Age of Onset  . Colon cancer Neg Hx    Past Surgical History:  Procedure Laterality Date  . CIRCUMCISION    . COLONOSCOPY    . EYE SURGERY    . MOUTH SURGERY     Social History   Social History Narrative  . No narrative on file     Objective: Vital Signs: BP (!) 153/85 (BP Location: Left Arm, Patient Position: Sitting, Cuff Size: Normal)   Pulse 97   Resp 16   Ht _1  (1.778 m)   Wt 249 lb (112.9 kg)   BMI 35.73 kg/m    Physical Exam  Constitutional: He is oriented to person, place, and time. He appears well-developed and well-nourished.  HENT:  Head: Normocephalic and atraumatic.  Eyes: Conjunctivae and EOM are normal. Pupils are equal, round, and reactive to light.  Neck: Normal range of motion. Neck supple.  Cardiovascular: Normal rate, regular rhythm and normal heart sounds.  Exam reveals no gallop and no friction rub.   No murmur heard. Pulmonary/Chest: Effort normal and breath sounds normal. No respiratory distress. He has no wheezes. He has no rales. He exhibits no tenderness.  Abdominal: Soft. He exhibits no distension and no mass. There is no tenderness. There is no guarding.  Musculoskeletal: Normal range of motion.  Lymphadenopathy:    He has no cervical adenopathy.  Neurological: He is alert and  oriented to person, place, and time. He exhibits normal muscle tone. Coordination normal.  Skin: Skin is warm and dry. Capillary refill takes less than 2 seconds. No rash noted.  Psychiatric: He has a normal mood and affect. His behavior is normal. Judgment and thought content normal.  Nursing note and vitals reviewed.    Musculoskeletal Exam:  Full range of motion of all joints Grip strength is equal and strong bilaterally For myalgia tender points are absent.  CDAI Exam: No CDAI exam completed.  No synovitis on examination.  Investigation: No additional findings.   Last labs that patient has brought in from [facility] are Ordered by Dr. Velna Hatchet CMP with GFR is normal except for nonfasting glucose mildly elevated at 129 CBC with differential is within normal limits Lipids are within normal limits Vitamin D is normal at 38.7 Hemoglobin A1c at 6.0% TSH is normal at 1.24 PSA is normal at 1.039 These labs will be addressed by PCP  No visits with results within 6 Month(s) from this visit.  Latest known visit with results is:  Hospital Outpatient Visit on 03/26/2007  Component Date Value Ref Range Status  . Color, Urine 03/26/2007 YELLOW   Final  . APPearance 03/26/2007 CLEAR   Final  . Specific Gravity, Urine 03/26/2007 1.022   Final  . pH 03/26/2007 5.0   Final  . Glucose, UA 03/26/2007 NEGATIVE   Final  . Hgb urine dipstick 03/26/2007 NEGATIVE   Final  . Bilirubin Urine 03/26/2007 NEGATIVE   Final  . Ketones, ur 03/26/2007 NEGATIVE   Final  . Protein, ur 03/26/2007 NEGATIVE   Final  . Urobilinogen, UA 03/26/2007 0.2   Final  . Nitrite 03/26/2007 NEGATIVE   Final  . Leukocytes, UA 03/26/2007 NEGATIVE MICROSCOPIC NOT DONE ON URINES WITH NEGATIVE PROTEIN, BLOOD, LEUKOCYTES, NITRITE, OR GLUCOSE <1000 mg/dL.   Final  . Sodium 03/26/2007 138   Final  . Potassium 03/26/2007 3.8   Final  . Chloride 03/26/2007 103   Final  . CO2 03/26/2007 30   Final  .  Glucose, Bld 03/26/2007 89   Final  . BUN 03/26/2007 12   Final  . Creatinine, Ser 03/26/2007 0.80   Final  . Calcium 03/26/2007 9.2   Final  . GFR calc non Af Amer 03/26/2007 >60   Final  . GFR calc Af Wyvonnia Lora 03/26/2007    Final                   Value:>60                                The eGFR has been calculated  using the MDRD equation.                         This calculation has not been                         validated in all clinical  . Hemoglobin 03/26/2007 14.0   Final  . HCT 03/26/2007 39.9   Final     Imaging: No results found.  Speciality Comments: No specialty comments available.    Procedures:  No procedures performed Allergies: Penicillins; Pork-derived products; and Sulfonamide derivatives   Assessment / Plan:     Visit Diagnoses: DJD (degenerative joint disease), cervical  Spondylosis of lumbar region without myelopathy or radiculopathy  Primary osteoarthritis of both knees  Primary osteoarthritis of both hands  Class 2 obesity due to excess calories without serious comorbidity with body mass index (BMI) of 39.0 to 39.9 in adult   Plan: #1: All of the history comes from his sister on that. Anne Ng states that patient has enlarged prostate. He is currently being evaluated and treated for. They're planning on seeing surgeon for possible treatment options soon. They are recommending that the patient drink 8 ounces of water 8 bottles per day minimum. He is at a group home and a center and they limit his strength into one bottle while he is there. In the attending note, I have written a recommendation that patient has access to more water her the patient's sister's request. I'm in agreement with that assessment the patient needs to have adequate amount of water daily.  #2: Facial be seeing his PCP soon and will have lab work updated. I've asked him to send me a copy of the CBC with differential and CMP with GFR when that's done.  That we have is from July 2017 and are within normal limits.  #3: Return to clinic in 6 months  #4: I discussed with the sister as well as the patient the importance of having some weight loss. Patient is in agreement and stated "  #5: History of right foot problem. He is being treated by [facility] orthopedics. Currently he is wearing a brace on his right ankle. They diagnosed with having poor arch support and this ankle brace is significantly helping that arch support stay stable. Patient is not having any pain on this visit as he did on the last visit.   Orders: No orders of the defined types were placed in this encounter.  No orders of the defined types were placed in this encounter.   Face-to-face time spent with patient was 30 minutes. 50% of time was spent in counseling and coordination of care.  Follow-Up Instructions: No Follow-up on file.   Eliezer Lofts, PA-C  Note - This record has been created using [employer].  Chart creation errors have been sought, but may not always  have been located. Such creation errors do not reflect on  the standard of medical care.

## 2016-03-09 DIAGNOSIS — F71 Moderate intellectual disabilities: Secondary | ICD-10-CM | POA: Diagnosis not present

## 2016-03-10 DIAGNOSIS — F71 Moderate intellectual disabilities: Secondary | ICD-10-CM | POA: Diagnosis not present

## 2016-03-11 DIAGNOSIS — F71 Moderate intellectual disabilities: Secondary | ICD-10-CM | POA: Diagnosis not present

## 2016-03-12 DIAGNOSIS — F71 Moderate intellectual disabilities: Secondary | ICD-10-CM | POA: Diagnosis not present

## 2016-03-13 DIAGNOSIS — F71 Moderate intellectual disabilities: Secondary | ICD-10-CM | POA: Diagnosis not present

## 2016-03-14 DIAGNOSIS — F71 Moderate intellectual disabilities: Secondary | ICD-10-CM | POA: Diagnosis not present

## 2016-03-15 DIAGNOSIS — F71 Moderate intellectual disabilities: Secondary | ICD-10-CM | POA: Diagnosis not present

## 2016-03-16 DIAGNOSIS — F71 Moderate intellectual disabilities: Secondary | ICD-10-CM | POA: Diagnosis not present

## 2016-03-17 DIAGNOSIS — F71 Moderate intellectual disabilities: Secondary | ICD-10-CM | POA: Diagnosis not present

## 2016-03-18 DIAGNOSIS — F71 Moderate intellectual disabilities: Secondary | ICD-10-CM | POA: Diagnosis not present

## 2016-03-19 DIAGNOSIS — F71 Moderate intellectual disabilities: Secondary | ICD-10-CM | POA: Diagnosis not present

## 2016-03-20 DIAGNOSIS — F71 Moderate intellectual disabilities: Secondary | ICD-10-CM | POA: Diagnosis not present

## 2016-03-21 DIAGNOSIS — F71 Moderate intellectual disabilities: Secondary | ICD-10-CM | POA: Diagnosis not present

## 2016-03-22 DIAGNOSIS — N401 Enlarged prostate with lower urinary tract symptoms: Secondary | ICD-10-CM | POA: Diagnosis not present

## 2016-03-22 DIAGNOSIS — R351 Nocturia: Secondary | ICD-10-CM | POA: Diagnosis not present

## 2016-03-22 DIAGNOSIS — F71 Moderate intellectual disabilities: Secondary | ICD-10-CM | POA: Diagnosis not present

## 2016-03-23 DIAGNOSIS — F71 Moderate intellectual disabilities: Secondary | ICD-10-CM | POA: Diagnosis not present

## 2016-03-24 DIAGNOSIS — F71 Moderate intellectual disabilities: Secondary | ICD-10-CM | POA: Diagnosis not present

## 2016-03-25 DIAGNOSIS — F71 Moderate intellectual disabilities: Secondary | ICD-10-CM | POA: Diagnosis not present

## 2016-03-26 DIAGNOSIS — F71 Moderate intellectual disabilities: Secondary | ICD-10-CM | POA: Diagnosis not present

## 2016-03-27 DIAGNOSIS — F71 Moderate intellectual disabilities: Secondary | ICD-10-CM | POA: Diagnosis not present

## 2016-03-28 DIAGNOSIS — F71 Moderate intellectual disabilities: Secondary | ICD-10-CM | POA: Diagnosis not present

## 2016-03-28 DIAGNOSIS — M6701 Short Achilles tendon (acquired), right ankle: Secondary | ICD-10-CM | POA: Diagnosis not present

## 2016-03-28 DIAGNOSIS — M79671 Pain in right foot: Secondary | ICD-10-CM | POA: Diagnosis not present

## 2016-03-28 DIAGNOSIS — M76821 Posterior tibial tendinitis, right leg: Secondary | ICD-10-CM | POA: Diagnosis not present

## 2016-03-29 ENCOUNTER — Ambulatory Visit (INDEPENDENT_AMBULATORY_CARE_PROVIDER_SITE_OTHER): Payer: Medicare HMO | Admitting: Podiatry

## 2016-03-29 DIAGNOSIS — B351 Tinea unguium: Secondary | ICD-10-CM | POA: Diagnosis not present

## 2016-03-29 DIAGNOSIS — G8929 Other chronic pain: Secondary | ICD-10-CM

## 2016-03-29 DIAGNOSIS — L84 Corns and callosities: Secondary | ICD-10-CM | POA: Diagnosis not present

## 2016-03-29 DIAGNOSIS — M25571 Pain in right ankle and joints of right foot: Secondary | ICD-10-CM

## 2016-03-29 DIAGNOSIS — M79675 Pain in left toe(s): Secondary | ICD-10-CM

## 2016-03-29 DIAGNOSIS — F71 Moderate intellectual disabilities: Secondary | ICD-10-CM | POA: Diagnosis not present

## 2016-03-29 DIAGNOSIS — M79674 Pain in right toe(s): Secondary | ICD-10-CM | POA: Diagnosis not present

## 2016-03-29 NOTE — Progress Notes (Signed)
Subjective:     Patient ID: Arthur Weaver, male   DOB: 12/13/1960, 55 y.o.   MRN: [id number]  HPIThis patient presents to the office with painful long thick nails.  His nails are painful walking and wearing his shoes.  .  He also remarks that his right foot is flat when he walks and wears his shoes. He has painful callus both feet. Patient says the AFO is doing well.   Review of Systems     Objective:   Physical Exam GENERAL APPEARANCE: Alert, conversant. Appropriately groomed. No acute distress.  VASCULAR: Pedal pulses absent  DP and PT bilateral.  Capillary refill time is immediate to all digits,   NEUROLOGIC: sensation is diminished  epicritically and protectively to 5.07 monofilament at 5/5 sites bilateral.  Light touch is intact bilateral.  MUSCULOSKELETAL: acceptable muscle strength, tone and stability bilateral.  Intrinsic muscluature intact bilateral.  Rectus appearance of foot and digits noted bilateral. Medial bulge noted at TNJ right foot. HAV B/L.  Hammer toes 2-4 B/L.  Patient has developed enlarged bone at TNJ right foot.  Swelling noted right ankle.  DERMATOLOGIC: skin color, texture, and turgor are within normal limits.  No preulcerative lesions or ulcers  are seen, no interdigital maceration noted.  No open lesions present.. No drainage noted.  Pinch callus B/L.  Callus sub 1st B/L. Distal clavi 2,3 B/L  NAILS  Thick disfigured discolored nails both feet.     Assessment:     Onychomycosis    Diabetes Mellitus Ankle Arthropathy.    Plan:     Debridement of Nails.  . Debridement of callus. RTC 3 months for nail care.     Khaza Blansett DPM      

## 2016-03-30 DIAGNOSIS — F71 Moderate intellectual disabilities: Secondary | ICD-10-CM | POA: Diagnosis not present

## 2016-03-31 ENCOUNTER — Other Ambulatory Visit: Payer: Self-pay | Admitting: Urology

## 2016-03-31 DIAGNOSIS — F71 Moderate intellectual disabilities: Secondary | ICD-10-CM | POA: Diagnosis not present

## 2016-04-01 ENCOUNTER — Other Ambulatory Visit: Payer: Self-pay | Admitting: Urology

## 2016-04-01 DIAGNOSIS — F71 Moderate intellectual disabilities: Secondary | ICD-10-CM | POA: Diagnosis not present

## 2016-04-02 DIAGNOSIS — F71 Moderate intellectual disabilities: Secondary | ICD-10-CM | POA: Diagnosis not present

## 2016-04-03 DIAGNOSIS — F71 Moderate intellectual disabilities: Secondary | ICD-10-CM | POA: Diagnosis not present

## 2016-04-04 DIAGNOSIS — F71 Moderate intellectual disabilities: Secondary | ICD-10-CM | POA: Diagnosis not present

## 2016-04-05 DIAGNOSIS — F71 Moderate intellectual disabilities: Secondary | ICD-10-CM | POA: Diagnosis not present

## 2016-04-06 DIAGNOSIS — F71 Moderate intellectual disabilities: Secondary | ICD-10-CM | POA: Diagnosis not present

## 2016-04-07 DIAGNOSIS — F71 Moderate intellectual disabilities: Secondary | ICD-10-CM | POA: Diagnosis not present

## 2016-04-08 DIAGNOSIS — F71 Moderate intellectual disabilities: Secondary | ICD-10-CM | POA: Diagnosis not present

## 2016-04-09 DIAGNOSIS — F71 Moderate intellectual disabilities: Secondary | ICD-10-CM | POA: Diagnosis not present

## 2016-04-10 DIAGNOSIS — F71 Moderate intellectual disabilities: Secondary | ICD-10-CM | POA: Diagnosis not present

## 2016-04-11 DIAGNOSIS — F71 Moderate intellectual disabilities: Secondary | ICD-10-CM | POA: Diagnosis not present

## 2016-04-12 DIAGNOSIS — F71 Moderate intellectual disabilities: Secondary | ICD-10-CM | POA: Diagnosis not present

## 2016-04-13 DIAGNOSIS — F71 Moderate intellectual disabilities: Secondary | ICD-10-CM | POA: Diagnosis not present

## 2016-04-14 ENCOUNTER — Encounter (HOSPITAL_BASED_OUTPATIENT_CLINIC_OR_DEPARTMENT_OTHER): Payer: Self-pay | Admitting: *Deleted

## 2016-04-14 DIAGNOSIS — F71 Moderate intellectual disabilities: Secondary | ICD-10-CM | POA: Diagnosis not present

## 2016-04-14 NOTE — Progress Notes (Signed)
SPOKE W/ PT'S SISTER, [name] KLOEKER.  SHE IS PT'S GUARDIAN AND HAS DUAL POA.  PT IS SPECIAL NEEDS MENTALLY CHALLENGED (INDEPENDENT ALD'S).  NPO AFTER MN.  ARRIVE AT 0600.  NEEDS ISTAT AND EKG.  WILL TAKE PRILOSEC AM DOS W/ SIPS OF WATER.  GUARDIANSHIP DOCUMENTATION IS IN EPIC UNDER MEDIA TAB.  SISTER TO BRING POA AND HCPOA DOS TO HAVE SCANNED.

## 2016-04-14 NOTE — Progress Notes (Signed)
   04/14/16 1138  OBSTRUCTIVE SLEEP APNEA  Have you ever been diagnosed with sleep apnea through a sleep study? No  Do you snore loudly (loud enough to be heard through closed doors)?  0  Do you often feel tired, fatigued, or sleepy during the daytime (such as falling asleep during driving or talking to someone)? 0  Has anyone observed you stop breathing during your sleep? 1  Do you have, or are you being treated for high blood pressure? 1  BMI more than 35 kg/m2? 1  Age > 21 (1-yes) 1  Male Gender (Yes=1) 1  Obstructive Sleep Apnea Score 5  Score 5 or greater  Results sent to PCP

## 2016-04-15 DIAGNOSIS — F71 Moderate intellectual disabilities: Secondary | ICD-10-CM | POA: Diagnosis not present

## 2016-04-16 DIAGNOSIS — F71 Moderate intellectual disabilities: Secondary | ICD-10-CM | POA: Diagnosis not present

## 2016-04-17 DIAGNOSIS — F71 Moderate intellectual disabilities: Secondary | ICD-10-CM | POA: Diagnosis not present

## 2016-04-18 DIAGNOSIS — F71 Moderate intellectual disabilities: Secondary | ICD-10-CM | POA: Diagnosis not present

## 2016-04-19 DIAGNOSIS — F71 Moderate intellectual disabilities: Secondary | ICD-10-CM | POA: Diagnosis not present

## 2016-04-20 DIAGNOSIS — F71 Moderate intellectual disabilities: Secondary | ICD-10-CM | POA: Diagnosis not present

## 2016-04-20 NOTE — Anesthesia Preprocedure Evaluation (Addendum)
Anesthesia Evaluation  Patient identified by MRN, date of birth, ID band Patient awake    Reviewed: Allergy & Precautions, NPO status , Patient's Chart, lab work & pertinent test results  Airway Mallampati: II  TM Distance: >3 FB Neck ROM: Full    Dental  (+) Dental Advisory Given, Teeth Intact, Partial Lower   Pulmonary neg pulmonary ROS, asthma ,    Pulmonary exam normal        Cardiovascular hypertension, Pt. on medications  Rhythm:Regular Rate:Normal  21-Apr-2016  Normal sinus rhythm Nonspecific ST and T wave abnormality Abnormal ECG   Neuro/Psych Pt  Lives in [place] . Pt's sister @ bedside to supplement health informationnegative neurological ROS     GI/Hepatic Neg liver ROS, GERD  Medicated and Controlled,  Endo/Other  diabetes  Renal/GU negative Renal ROS     Musculoskeletal  (+) Arthritis , Osteoarthritis,    Abdominal   Peds  Hematology negative hematology ROS (+)   Anesthesia Other Findings   Reproductive/Obstetrics                         Lab Results  Component Value Date   HGB 14.0 03/26/2007   HCT 39.9 03/26/2007   Lab Results  Component Value Date   CREATININE 0.80 03/26/2007   BUN 12 03/26/2007   NA 138 03/26/2007   K 3.8 03/26/2007   CL 103 03/26/2007   CO2 30 03/26/2007    Anesthesia Physical Anesthesia Plan  ASA: III  Anesthesia Plan: General   Post-op Pain Management:    Induction: Intravenous  Airway Management Planned: LMA  Additional Equipment:   Intra-op Plan:   Post-operative Plan: Extubation in OR  Informed Consent: I have reviewed the patients History and Physical, chart, labs and discussed the procedure including the risks, benefits and alternatives for the proposed anesthesia with the patient or authorized representative who has indicated his/her understanding and acceptance.   Consent reviewed with POA  Plan Discussed with:  CRNA  Anesthesia Plan Comments:        Anesthesia Quick Evaluation

## 2016-04-21 ENCOUNTER — Encounter (HOSPITAL_BASED_OUTPATIENT_CLINIC_OR_DEPARTMENT_OTHER)
Admission: RE | Disposition: A | Discharge status: Home / Self Care | Payer: Self-pay | Source: Ambulatory Visit | Attending: Urology

## 2016-04-21 ENCOUNTER — Ambulatory Visit (HOSPITAL_BASED_OUTPATIENT_CLINIC_OR_DEPARTMENT_OTHER): Payer: Medicare HMO | Admitting: Anesthesiology

## 2016-04-21 ENCOUNTER — Encounter (HOSPITAL_BASED_OUTPATIENT_CLINIC_OR_DEPARTMENT_OTHER): Payer: Self-pay | Admitting: *Deleted

## 2016-04-21 ENCOUNTER — Ambulatory Visit (HOSPITAL_BASED_OUTPATIENT_CLINIC_OR_DEPARTMENT_OTHER)
Admission: RE | Admit: 2016-04-21 | Discharge: 2016-04-21 | Disposition: A | Discharge status: Home / Self Care | Payer: Medicare HMO | Source: Ambulatory Visit | Attending: Urology | Admitting: Urology

## 2016-04-21 DIAGNOSIS — N400 Enlarged prostate without lower urinary tract symptoms: Secondary | ICD-10-CM | POA: Diagnosis not present

## 2016-04-21 DIAGNOSIS — K219 Gastro-esophageal reflux disease without esophagitis: Secondary | ICD-10-CM | POA: Diagnosis not present

## 2016-04-21 DIAGNOSIS — M1990 Unspecified osteoarthritis, unspecified site: Secondary | ICD-10-CM | POA: Diagnosis not present

## 2016-04-21 DIAGNOSIS — J45909 Unspecified asthma, uncomplicated: Secondary | ICD-10-CM | POA: Insufficient documentation

## 2016-04-21 DIAGNOSIS — I10 Essential (primary) hypertension: Secondary | ICD-10-CM | POA: Diagnosis not present

## 2016-04-21 DIAGNOSIS — N401 Enlarged prostate with lower urinary tract symptoms: Secondary | ICD-10-CM | POA: Insufficient documentation

## 2016-04-21 DIAGNOSIS — R030 Elevated blood-pressure reading, without diagnosis of hypertension: Secondary | ICD-10-CM | POA: Diagnosis not present

## 2016-04-21 DIAGNOSIS — R351 Nocturia: Secondary | ICD-10-CM | POA: Insufficient documentation

## 2016-04-21 DIAGNOSIS — E119 Type 2 diabetes mellitus without complications: Secondary | ICD-10-CM | POA: Diagnosis not present

## 2016-04-21 DIAGNOSIS — F71 Moderate intellectual disabilities: Secondary | ICD-10-CM | POA: Diagnosis not present

## 2016-04-21 DIAGNOSIS — E7800 Pure hypercholesterolemia, unspecified: Secondary | ICD-10-CM | POA: Diagnosis not present

## 2016-04-21 DIAGNOSIS — Z7982 Long term (current) use of aspirin: Secondary | ICD-10-CM | POA: Diagnosis not present

## 2016-04-21 DIAGNOSIS — R3912 Poor urinary stream: Secondary | ICD-10-CM | POA: Diagnosis not present

## 2016-04-21 HISTORY — PX: THULIUM LASER TURP (TRANSURETHRAL RESECTION OF PROSTATE): SHX6744

## 2016-04-21 HISTORY — DX: Unspecified osteoarthritis, unspecified site: M19.90

## 2016-04-21 HISTORY — DX: Reserved for inherently not codable concepts without codable children: IMO0001

## 2016-04-21 HISTORY — DX: Urgency of urination: R39.15

## 2016-04-21 HISTORY — DX: Diverticulosis of large intestine without perforation or abscess without bleeding: K57.30

## 2016-04-21 HISTORY — DX: Prediabetes: R73.03

## 2016-04-21 HISTORY — DX: Other intervertebral disc degeneration, lumbar region: M51.36

## 2016-04-21 HISTORY — DX: Other problems related to housing and economic circumstances: Z59.8

## 2016-04-21 HISTORY — DX: Other intervertebral disc degeneration, lumbar region without mention of lumbar back pain or lower extremity pain: M51.369

## 2016-04-21 HISTORY — DX: Unspecified intellectual disabilities: F79

## 2016-04-21 HISTORY — DX: Other cervical disc degeneration, unspecified cervical region: M50.30

## 2016-04-21 HISTORY — DX: Benign prostatic hyperplasia without lower urinary tract symptoms: N40.0

## 2016-04-21 HISTORY — DX: Hyperlipidemia, unspecified: E78.5

## 2016-04-21 HISTORY — DX: Family history of diseases of the blood and blood-forming organs and certain disorders involving the immune mechanism: Z83.2

## 2016-04-21 HISTORY — DX: Presence of dental prosthetic device (complete) (partial): Z97.2

## 2016-04-21 HISTORY — DX: Other specified personal risk factors, not elsewhere classified: Z91.89

## 2016-04-21 HISTORY — DX: Frequency of micturition: R35.0

## 2016-04-21 HISTORY — DX: Personal history of adenomatous and serrated colon polyps: Z86.0101

## 2016-04-21 HISTORY — DX: Spondylosis without myelopathy or radiculopathy, thoracic region: M47.814

## 2016-04-21 HISTORY — DX: Personal history of colonic polyps: Z86.010

## 2016-04-21 HISTORY — DX: Essential (primary) hypertension: I10

## 2016-04-21 LAB — POCT I-STAT 4, (NA,K, GLUC, HGB,HCT)
GLUCOSE: 124 mg/dL — AB (ref 65–99)
HEMATOCRIT: 41 % (ref 39.0–52.0)
Hemoglobin: 13.9 g/dL (ref 13.0–17.0)
Potassium: 3.7 mmol/L (ref 3.5–5.1)
SODIUM: 140 mmol/L (ref 135–145)

## 2016-04-21 LAB — GLUCOSE, CAPILLARY: Glucose-Capillary: 138 mg/dL — ABNORMAL HIGH (ref 65–99)

## 2016-04-21 SURGERY — THULIUM LASER TURP (TRANSURETHRAL RESECTION OF PROSTATE)
Anesthesia: General

## 2016-04-21 MED ORDER — PHENYLEPHRINE HCL 10 MG/ML IJ SOLN
INTRAMUSCULAR | Status: AC
  Filled 2016-04-21: qty 1

## 2016-04-21 MED ORDER — PROMETHAZINE HCL 25 MG/ML IJ SOLN
6.2500 mg | INTRAMUSCULAR | Status: DC | PRN
  Filled 2016-04-21: qty 1

## 2016-04-21 MED ORDER — FENTANYL CITRATE (PF) 100 MCG/2ML IJ SOLN
25.0000 ug | INTRAMUSCULAR | Status: DC | PRN
  Filled 2016-04-21: qty 1

## 2016-04-21 MED ORDER — LIDOCAINE 2% (20 MG/ML) 5 ML SYRINGE
INTRAMUSCULAR | Status: AC
  Filled 2016-04-21: qty 5

## 2016-04-21 MED ORDER — CIPROFLOXACIN IN D5W 400 MG/200ML IV SOLN
INTRAVENOUS | Status: AC
  Filled 2016-04-21: qty 200

## 2016-04-21 MED ORDER — PHENYLEPHRINE HCL 10 MG/ML IJ SOLN
INTRAMUSCULAR | Status: DC | PRN
  Administered 2016-04-21 (×9): 80 ug via INTRAVENOUS

## 2016-04-21 MED ORDER — LACTATED RINGERS IV SOLN
INTRAVENOUS | Status: DC
  Administered 2016-04-21 (×2): via INTRAVENOUS
  Filled 2016-04-21: qty 1000

## 2016-04-21 MED ORDER — HYOSCYAMINE SULFATE SL 0.125 MG SL SUBL: 0.1250 mg | SUBLINGUAL_TABLET | Freq: Two times a day (BID) | SUBLINGUAL | 1 refills | Status: DC | PRN

## 2016-04-21 MED ORDER — TRIMETHOPRIM 100 MG PO TABS: 100.0000 mg | ORAL_TABLET | Freq: Every day | ORAL | 0 refills | Status: DC

## 2016-04-21 MED ORDER — DEXTROSE 5 % IV SOLN
INTRAVENOUS | Status: DC | PRN
  Administered 2016-04-21: 100 ug/min via INTRAVENOUS

## 2016-04-21 MED ORDER — SODIUM CHLORIDE 0.9 % IR SOLN
Status: DC | PRN
  Administered 2016-04-21: 3000 mL
  Administered 2016-04-21: 3000 mL via INTRAVESICAL
  Administered 2016-04-21 (×4): 3000 mL

## 2016-04-21 MED ORDER — WHITE PETROLATUM GEL
Status: AC
  Filled 2016-04-21: qty 5

## 2016-04-21 MED ORDER — FENTANYL CITRATE (PF) 100 MCG/2ML IJ SOLN
INTRAMUSCULAR | Status: DC | PRN
  Administered 2016-04-21: 25 ug via INTRAVENOUS
  Administered 2016-04-21: 50 ug via INTRAVENOUS
  Administered 2016-04-21: 25 ug via INTRAVENOUS

## 2016-04-21 MED ORDER — KETOROLAC TROMETHAMINE 30 MG/ML IJ SOLN
30.0000 mg | INTRAMUSCULAR | Status: DC
  Administered 2016-04-21: 30 mg via INTRAVENOUS
  Filled 2016-04-21: qty 1

## 2016-04-21 MED ORDER — LIDOCAINE 2% (20 MG/ML) 5 ML SYRINGE
INTRAMUSCULAR | Status: DC | PRN
  Administered 2016-04-21: 100 mg via INTRAVENOUS

## 2016-04-21 MED ORDER — EPHEDRINE SULFATE-NACL 50-0.9 MG/10ML-% IV SOSY
PREFILLED_SYRINGE | INTRAVENOUS | Status: DC | PRN
  Administered 2016-04-21: 15 mg via INTRAVENOUS

## 2016-04-21 MED ORDER — HYOSCYAMINE SULFATE 0.125 MG SL SUBL
0.1250 mg | SUBLINGUAL_TABLET | Freq: Two times a day (BID) | SUBLINGUAL | Status: DC
  Administered 2016-04-21: 0.125 mg via SUBLINGUAL
  Filled 2016-04-21: qty 1

## 2016-04-21 MED ORDER — TRAMADOL-ACETAMINOPHEN 37.5-325 MG PO TABS: 1.0000 | ORAL_TABLET | Freq: Four times a day (QID) | ORAL | 0 refills | Status: DC | PRN

## 2016-04-21 MED ORDER — HYOSCYAMINE SULFATE 0.125 MG SL SUBL
SUBLINGUAL_TABLET | SUBLINGUAL | Status: AC
  Filled 2016-04-21: qty 1

## 2016-04-21 MED ORDER — EPHEDRINE 5 MG/ML INJ
INTRAVENOUS | Status: AC
  Filled 2016-04-21: qty 10

## 2016-04-21 MED ORDER — PHENYLEPHRINE 40 MCG/ML (10ML) SYRINGE FOR IV PUSH (FOR BLOOD PRESSURE SUPPORT)
PREFILLED_SYRINGE | INTRAVENOUS | Status: AC
  Filled 2016-04-21: qty 20

## 2016-04-21 MED ORDER — FENTANYL CITRATE (PF) 100 MCG/2ML IJ SOLN
INTRAMUSCULAR | Status: AC
  Filled 2016-04-21: qty 4

## 2016-04-21 MED ORDER — PROPOFOL 10 MG/ML IV BOLUS
INTRAVENOUS | Status: AC
  Filled 2016-04-21: qty 40

## 2016-04-21 MED ORDER — PHENAZOPYRIDINE HCL 200 MG PO TABS: 200.0000 mg | ORAL_TABLET | Freq: Three times a day (TID) | ORAL | 3 refills | Status: DC | PRN

## 2016-04-21 MED ORDER — ONDANSETRON HCL 4 MG/2ML IJ SOLN
INTRAMUSCULAR | Status: DC | PRN
  Administered 2016-04-21: 4 mg via INTRAVENOUS

## 2016-04-21 MED ORDER — MIDAZOLAM HCL 2 MG/2ML IJ SOLN
INTRAMUSCULAR | Status: AC
  Filled 2016-04-21: qty 2

## 2016-04-21 MED ORDER — CIPROFLOXACIN IN D5W 400 MG/200ML IV SOLN
400.0000 mg | Freq: Once | INTRAVENOUS | Status: AC
  Administered 2016-04-21: 400 mg via INTRAVENOUS
  Filled 2016-04-21: qty 200

## 2016-04-21 MED ORDER — HYOSCYAMINE SULFATE 0.125 MG PO TABS
0.1250 mg | ORAL_TABLET | Freq: Two times a day (BID) | ORAL | Status: DC
  Filled 2016-04-21: qty 1

## 2016-04-21 MED ORDER — MIDAZOLAM HCL 5 MG/5ML IJ SOLN
INTRAMUSCULAR | Status: DC | PRN
  Administered 2016-04-21: 2 mg via INTRAVENOUS

## 2016-04-21 MED ORDER — DEXAMETHASONE SODIUM PHOSPHATE 4 MG/ML IJ SOLN
INTRAMUSCULAR | Status: DC | PRN
  Administered 2016-04-21: 10 mg via INTRAVENOUS

## 2016-04-21 MED ORDER — ACETAMINOPHEN 500 MG PO TABS
ORAL_TABLET | ORAL | Status: AC
  Filled 2016-04-21: qty 2

## 2016-04-21 MED ORDER — ONDANSETRON HCL 4 MG/2ML IJ SOLN
INTRAMUSCULAR | Status: AC
  Filled 2016-04-21: qty 2

## 2016-04-21 MED ORDER — PROPOFOL 10 MG/ML IV BOLUS
INTRAVENOUS | Status: DC | PRN
  Administered 2016-04-21: 200 mg via INTRAVENOUS

## 2016-04-21 MED ORDER — ACETAMINOPHEN 500 MG PO TABS
1000.0000 mg | ORAL_TABLET | ORAL | Status: AC
  Administered 2016-04-21: 1000 mg via ORAL
  Filled 2016-04-21: qty 2

## 2016-04-21 MED ORDER — BACITRACIN-NEOMYCIN-POLYMYXIN 400-5-5000 EX OINT: 1.0000 "application " | TOPICAL_OINTMENT | Freq: Two times a day (BID) | CUTANEOUS | 0 refills | Status: AC

## 2016-04-21 MED ORDER — DEXAMETHASONE SODIUM PHOSPHATE 10 MG/ML IJ SOLN
INTRAMUSCULAR | Status: AC
Start: 2016-04-21 — End: 2016-04-21
  Filled 2016-04-21: qty 1

## 2016-04-21 SURGICAL SUPPLY — 27 items
BAG DRAIN URO-CYSTO SKYTR STRL (DRAIN) ×3 IMPLANT
BAG URINE DRAINAGE (UROLOGICAL SUPPLIES) ×3 IMPLANT
CATH COUDE FOLEY 2W 5CC 18FR (CATHETERS) IMPLANT
CATH FOLEY 2WAY SLVR  5CC 18FR (CATHETERS)
CATH FOLEY 2WAY SLVR 5CC 18FR (CATHETERS) IMPLANT
CATH FOLEY 3WAY 30CC 22F (CATHETERS) IMPLANT
CATH HEMA 3WAY 30CC 22FR COUDE (CATHETERS) ×3 IMPLANT
CLOTH BEACON ORANGE TIMEOUT ST (SAFETY) ×3 IMPLANT
ELECT BIVAP BIPO 22/24 DONUT (ELECTROSURGICAL)
ELECT LOOP MED HF 24F 12D (CUTTING LOOP) IMPLANT
ELECTRD BIVAP BIPO 22/24 DONUT (ELECTROSURGICAL) IMPLANT
GLOVE BIO SURGEON STRL SZ7.5 (GLOVE) ×3 IMPLANT
GOWN STRL REUS W/ TWL XL LVL3 (GOWN DISPOSABLE) ×1 IMPLANT
GOWN STRL REUS W/TWL XL LVL3 (GOWN DISPOSABLE) ×2
HOLDER FOLEY CATH W/STRAP (MISCELLANEOUS) ×3 IMPLANT
IV NS IRRIG 3000ML ARTHROMATIC (IV SOLUTION) ×3 IMPLANT
IV SET EXTENSION GRAVITY 40 LF (IV SETS) ×3 IMPLANT
KIT RM TURNOVER CYSTO AR (KITS) ×3 IMPLANT
LASER REVOLIX PROCEDURE (MISCELLANEOUS) ×3 IMPLANT
LOOP CUT BIPOLAR 24F LRG (ELECTROSURGICAL) IMPLANT
MANIFOLD NEPTUNE II (INSTRUMENTS) ×3 IMPLANT
PACK CYSTO (CUSTOM PROCEDURE TRAY) ×3 IMPLANT
PLUG CATH AND CAP STER (CATHETERS) ×3 IMPLANT
SYR 30ML LL (SYRINGE) ×3 IMPLANT
SYRINGE IRR TOOMEY STRL 70CC (SYRINGE) IMPLANT
TUBE CONNECTING 12'X1/4 (SUCTIONS)
TUBE CONNECTING 12X1/4 (SUCTIONS) IMPLANT

## 2016-04-21 NOTE — H&P (Signed)
Office Visit Report     03/22/2016   --------------------------------------------------------------------------------   Arthur Weaver  MRN: [id number]  PRIMARY CARE:  Velna Hatchet, MD  DOB: 08-10-1960, 56 year old Male  REFERRING:     PROVIDER:  Carolan Clines, M.D.    LOCATION:  [facility] [phone]   --------------------------------------------------------------------------------   CC: I have an enlarged prostate (follow-up).  HPI: Arthur Weaver is a 56 year-old male established patient who is here for an enlarged prostate follow-up evaluation.  He is currently on tamsulosin and rapaflo for the symptoms due to the enlarged prostate gland. He is not on new medications for symptoms of prostate enlargement.   He does have an abnormal sensation when needing to urinate. He does not have a good size and strength to his urinary stream. He is having problems with emptying his bladder well. He does dribble at the end of urination.   Urodynamics on 12/23/14: Pressure flow study shows that the patient has a voluntary contraction, and is able to void 800 cc, with a maximum flow rate of 6 cc/s. The detrusor pressure at maximum flow is 48 cm of water pressure. The patient triggers a second bladder contraction with a Valsalva, and pressures reached 57 cm of water pressure. The PVR 65 cc. It takes the patient extended amount of time to void. VCUG is accomplished, and shows that the patient hasn't increase in sphincter activity due to poor relaxation of the external sphincter. This is in part due to straining. There is no reflux noted.  Arthur Weaver has a maximum bladder capacity of 865 cc. He has a normal desire to void at 591 cc. He has low amplitude instability. There is no leakage however with this instability.  The patient has difficulty voiding during the study, but is finally able to generate a contraction strong enough to void. As he attempted to void, the video shows his  bladder neck bulging, and trying to open, but then stopping. 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 As he tends to void, the bladder neck tries to open, but the pelvic floor muscles contract. He appears to void by straining. Trabeculation is noted and there is no reflux.     AUA Symptom Score: More than 50% of the time he has the sensation of not emptying his bladder completely when finished urinating. 50% of the time he has to urinate again fewer than two hours after he has finished urinating. 50% of the time he has to start and stop again several times when he urinates. Almost always he finds it difficult to postpone urination. Almost always he has a weak urinary stream. He has to get up to urinate 1 time from the time he goes to bed until the time he gets up in the morning.     QOL Score: He would feel mixed if he had to live with his urinary condition the way it is now for the rest of his life.   Calculated QOL Symptom Score: 3    ALLERGIES: Codeine Derivatives Sulfa Drugs    MEDICATIONS: Proscar 5 mg tablet 1 tablet PO Daily  Tamsulosin Hcl 0.4 mg capsule, ext release 24 hr TAKE 2 CAPSULES BY MOUTH DAILY  Tamsulosin Hcl 0.4 mg capsule, ext release 24 hr TAKE TWO CAPSULES BY MOUTH ONCE DAILY  Aspirin 81 MG TABS Oral  Atorvastatin Calcium 40 mg tablet Oral  Lisinopril 40 mg tablet Oral  Multi-Vitamin TABS Oral  Nabumetone 500 mg tablet Oral  Omeprazole 40 mg capsule,delayed release Oral  Tamsulosin HCl - 0.4 MG Oral Capsule 0 Oral  Vitamin D3 1000 UNIT Oral Tablet Oral  Voltaren 1 % gel Transdermal     GU PSH: Complex Uroflow - 02/17/2016, 08/24/2015 Non-Newborn Circumcision - 2009 Prostate Needle Biopsy - 2009      [place] Notes: Circumcision No Clamp/Device/Dorsal Slit Older Than 28 Days, Biopsy Of The Prostate Needle, Cataract Surgery, Dermatological Surgery   NON-GU PSH: None   GU PMH: BPH w/LUTS - 02/17/2016, Benign prostatic hyperplasia with urinary obstruction, -  02/03/2015 Bladder, Neuromuscular dysfunction, Unspec, Neurogenic bladder - 02/03/2015, Neurogenic incontinence, - 12/10/2014 Nodular prostate w/o LUTS, Nodular prostate without lower urinary tract symptoms - 02/03/2015 Urinary Urgency, Urinary urgency - 02/03/2015 Nocturia, Nocturia - 12/10/2014 Urge incontinence, Urge incontinence of urine - 12/10/2014 Phimosis, Phimosis - 2014      PMH Notes:  2007-02-20 10:31:56 - Note: Arthritis   NON-GU PMH: Encounter for general adult medical examination without abnormal findings, Encounter for preventive health examination - 12/10/2014 Bursitis of unspecified shoulder, Bursitis of shoulder - 2014 Personal history of other diseases of the circulatory system, History of hypertension - 2014 Personal history of other endocrine, nutritional and metabolic disease, History of hypercholesterolemia - 2014, History of diabetes mellitus, - 2014 Personal history of other infectious and parasitic diseases, History of hepatitis - 2014    FAMILY HISTORY: Arthritis - Sister, Father, Mother, Brother Diabetes - Father, Mother Diverticulitis Of Colon - Brother Esophageal Reflux - Brother, Father, Sister Father Deceased At [employer] ___ - Runs In Family Hypertension - Mother Mother Deceased At Age 61 from diabetic complicati - Runs In Family Pure Hypercholesterolemia - Mother   SOCIAL HISTORY: Marital Status: Single Current Smoking Status: Patient has never smoked.  Has never drank.  Drinks 1 caffeinated drink per day. Patient's occupation is/was Disabled.    REVIEW OF SYSTEMS:    GU Review Male:   Patient denies frequent urination, hard to postpone urination, burning/ pain with urination, get up at night to urinate, leakage of urine, stream starts and stops, trouble starting your stream, have to strain to urinate , erection problems, and penile pain.  Gastrointestinal (Upper):   Patient denies nausea, vomiting, and indigestion/ heartburn.  Gastrointestinal  (Lower):   Patient denies diarrhea and constipation.  Constitutional:   Patient denies fever, night sweats, weight loss, and fatigue.  Skin:   Patient denies skin rash/ lesion and itching.  Eyes:   Patient denies blurred vision and double vision.  Ears/ Nose/ Throat:   Patient denies sore throat and sinus problems.  Hematologic/Lymphatic:   Patient denies swollen glands and easy bruising.  Cardiovascular:   Patient denies leg swelling and chest pains.  Respiratory:   Patient denies cough and shortness of breath.  Endocrine:   Patient denies excessive thirst.  Musculoskeletal:   Patient denies back pain and joint pain.  Neurological:   Patient denies headaches and dizziness.  Psychologic:   Patient denies depression and anxiety.   VITAL SIGNS: None   GU PHYSICAL EXAMINATION:    Anus and Perineum: No hemorrhoids. No anal stenosis. No rectal fissure, no anal fissure. No edema, no dimple, no perineal tenderness, no anal tenderness.   Scrotum: No lesions. No edema. No cysts. No warts.  Epididymides: Right: no spermatocele, no masses, no cysts, no tenderness, no induration, no enlargement. Left: no spermatocele, no masses, no cysts, no tenderness, no induration, no enlargement.  Testes: No tenderness, no swelling, no enlargement left testes. No tenderness, no swelling,  no enlargement right testes. Normal location left testes. Normal location right testes. No mass, no cyst, no varicocele, no hydrocele left testes. No mass, no cyst, no varicocele, no hydrocele right testes.  Urethral Meatus: Normal size. No lesion, no wart, no discharge, no polyp. Normal location.  Penis: Penis uncircumcised. No foreskin warts, no cracks. No dorsal peyronie's plaques, no left corporal peyronie's plaques, no right corporal peyronie's plaques, no scarring, no shaft warts. No balanitis, no meatal stenosis.   Prostate: 40 gram or 2+ size. Left lobe normal consistency, right lobe normal consistency. Symmetrical lobes. No  prostate nodule. Left lobe no tenderness, right lobe no tenderness.  Seminal Vesicles: Nonpalpable.  Sphincter Tone: Sphincter tone is poor. No rectal tenderness. No rectal mass.    MULTI-SYSTEM PHYSICAL EXAMINATION:    Constitutional: Obese. No physical deformities. Normally developed. Good grooming.   Neck: Neck symmetrical, not swollen. Normal tracheal position.  Respiratory: No labored breathing, no use of accessory muscles.   Cardiovascular: Normal temperature, normal extremity pulses, no swelling, no varicosities.  Lymphatic: No enlargement of neck, axillae, groin.  Skin: No paleness, no jaundice, no cyanosis. No lesion, no ulcer, no rash.  Neurologic / Psychiatric: Oriented to time, oriented to place, oriented to person. No depression, no anxiety, no agitation. Mental disability  Gastrointestinal: No mass, no tenderness, no rigidity, non obese abdomen.  Eyes: Normal conjunctivae. Normal eyelids.  Ears, Nose, Mouth, and Throat: Left ear no scars, no lesions, no masses. Right ear no scars, no lesions, no masses. Nose no scars, no lesions, no masses. Normal hearing. Normal lips.  Musculoskeletal: Normal gait and station of head and neck.     PAST DATA REVIEWED:  Source Of History:  Patient  Lab Test Review:   PSA, BUN/Creatinine  Records Review:   Previous Patient Records  X-Ray Review: Prostate Ultrasound: Reviewed Films. Reviewed Report. Discussed With Patient.     02/17/16 08/24/15 12/05/12 12/07/11 10/21/09 07/29/08  PSA  Total PSA 0.61  0.94  0.94  0.87  0.74  0.83     03/22/16  Urinalysis  Urine Appearance Clear   Urine Color Yellow   Urine Glucose Neg   Urine Bilirubin Neg   Urine Ketones Neg   Urine Specific Gravity 1.015   Urine Blood 1+   Urine pH 5.5   Urine Protein Neg   Urine Urobilinogen 0.2   Urine Nitrites Neg   Urine Leukocyte Esterase Neg   Urine WBC/hpf NS (Not Seen)   Urine RBC/hpf 0 - 2/hpf   Urine Epithelial Cells 0 - 5/hpf   Urine Bacteria NS  (Not Seen)    PROCEDURES:         Flexible Cystoscopy - 52000  Risks, benefits, and some of the potential complications of the procedure were discussed at length with the patient including infection, bleeding, voiding discomfort, urinary retention, fever, chills, sepsis, and others. All questions were answered. Informed consent was obtained. Antibiotic prophylaxis was given. Sterile technique and intraurethral analgesia were used.  Meatus:  Normal size. Normal location. Normal condition.  Urethra:  No strictures.  External Sphincter:  Normal.  Verumontanum:  Normal.  Prostate:  Obstructing. Moderate hyperplasia.  Bladder Neck:  Non-obstructing.  Ureteral Orifices:  Normal location. Normal size. Normal shape. Effluxed clear urine.  Bladder:  No trabeculation. No tumors. Normal mucosa. No stones.      The lower urinary tract was carefully examined. The procedure was well-tolerated and without complications. Antibiotic instructions were given. Instructions were given to call the office immediately  for bloody urine, difficulty urinating, urinary retention, painful or frequent urination, fever, chills, nausea, vomiting or other illness. The patient stated that he understood these instructions and would comply with them.         Prostate Ultrasound - 69485  Length: 4.06cm Height: 3.11 cm Width: 4.74cm Volume: 31.52ml  Prostate:  Appears within normal limits      The transrectal ultrasound probe is introduced into the rectum, and the prostate is visualized. Ultrasonography is utilized throughout the procedure. At the conclusion of the procedure, the ultrasound probe is removed. The patient tolerates the procedure without complication.         Urinalysis w/Scope Dipstick Dipstick Cont'd Micro  Color: Yellow Bilirubin: Neg WBC/hpf: NS (Not Seen)  Appearance: Clear Ketones: Neg RBC/hpf: 0 - 2/hpf  Specific Gravity: 1.015 Blood: 1+ Bacteria: NS (Not Seen)  pH: 5.5 Protein: Neg Cystals: NS (Not  Seen)  Glucose: Neg Urobilinogen: 0.2 Casts: NS (Not Seen)    Nitrites: Neg Trichomonas: Not Present    Leukocyte Esterase: Neg Mucous: Not Present      Epithelial Cells: 0 - 5/hpf      Yeast: NS (Not Seen)      Sperm: Not Present    ASSESSMENT:      ICD-10 Details  1 GU:   BPH w/LUTS - N40.1   2   Nocturia - R35.1    PLAN:           Schedule Return Visit/Planned Activity: Keep Scheduled Appointment - Schedule Surgery          Document Letter(s):  Created for Patient: Clinical Summary         Notes:   Arrange Thullium laser of prostate.       The information contained in this medical record document is considered private and confidential patient information. This information can only be used for the medical diagnosis and/or medical services that are being provided by the patient's selected caregivers. This information can only be distributed outside of the patient's care if the patient agrees and signs waivers of authorization for this information to be sent to an outside source or route.

## 2016-04-21 NOTE — Anesthesia Procedure Notes (Signed)
Procedure Name: LMA Insertion Date/Time: 04/21/2016 7:40 AM Performed by: Wanita Chamberlain Pre-anesthesia Checklist: Patient identified, Timeout performed, Emergency Drugs available, Suction available and Patient being monitored Patient Re-evaluated:Patient Re-evaluated prior to inductionOxygen Delivery Method: Circle system utilized Preoxygenation: Pre-oxygenation with 100% oxygen Intubation Type: IV induction Ventilation: Mask ventilation without difficulty LMA: LMA inserted LMA Size: 5.0 Number of attempts: 1 Airway Equipment and Method: Bite block Placement Confirmation: breath sounds checked- equal and bilateral and positive ETCO2 Tube secured with: Tape Dental Injury: Teeth and Oropharynx as per pre-operative assessment

## 2016-04-21 NOTE — Anesthesia Postprocedure Evaluation (Signed)
Anesthesia Post Note  Patient: RISHI VICARIO  Procedure(s) Performed: Procedure(s) (LRB): THULIUM LASER TURP (TRANSURETHRAL RESECTION OF PROSTATE) (N/A)  Patient location during evaluation: PACU Anesthesia Type: General Level of consciousness: awake and alert Pain management: pain level controlled Vital Signs Assessment: post-procedure vital signs reviewed and stable Respiratory status: spontaneous breathing, nonlabored ventilation, respiratory function stable and patient connected to nasal cannula oxygen Cardiovascular status: blood pressure returned to baseline and stable Postop Assessment: no signs of nausea or vomiting Anesthetic complications: no       Last Vitals:  Vitals:   04/21/16 1000 04/21/16 1045  BP:  139/87  Pulse: 92 91  Resp: 19 16  Temp:  36.8 C    Last Pain:  Vitals:   04/21/16 0610  TempSrc: Leatrice Jewels                 Tiajuana Amass

## 2016-04-21 NOTE — Op Note (Signed)
Pre-operative diagnosis :   BPH  Postoperative diagnosis: Same  Operation: Cystourethroscopy, thulium laser prostatectomy  Surgeon:  S. Gaynelle Arabian, MD  First assistant: None  Anesthesia:  Gen. LMA  Preparation: After appropriate preanesthesia, the patient was brought to the operative room, placed on the operating table in the dorsal supine position where general LMA anesthesia was introduced. He was then replaced in the dorsal lithotomy position with pubis was prepped with Betadine solution and draped in the usual fashion. The history was reviewed. The armband was double checked.  Review history:  Arthur Weaver is a 56 year-old male established patient who is here for an enlarged prostate follow-up evaluation.  He is currently on tamsulosin and rapaflo for the symptoms due to the enlarged prostate gland. He is not on new medications for symptoms of prostate enlargement.   He does have an abnormal sensation when needing to urinate. He does not have a good size and strength to his urinary stream. He is having problems with emptying his bladder well. He does dribble at the end of urination.   Urodynamics on 12/23/14: Pressure flow study shows that the patient has a voluntary contraction, and is able to void 800 cc, with a maximum flow rate of 6 cc/s. The detrusor pressure at maximum flow is 48 cm of water pressure. The patient triggers a second bladder contraction with a Valsalva, and pressures reached 57 cm of water pressure. The PVR 65 cc. It takes the patient extended amount of time to void. VCUG is accomplished, and shows that the patient hasn't increase in sphincter activity due to poor relaxation of the external sphincter. This is in part due to straining. There is no reflux noted.  Arthur Weaver has a maximum bladder capacity of 865 cc. He has a normal desire to void at 591 cc. He has low amplitude instability. There is no leakage however with this instability.  The patient has difficulty  voiding during the study, but is finally able to generate a contraction strong enough to void. As he attempted to void, the video shows his bladder neck bulging, and trying to open, but then stopping. 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 As he tends to void, the bladder neck tries to open, but the pelvic floor muscles contract. He appears to void by straining. Trabeculation is noted and there is no reflux.     AUA Symptom Score: More than 50% of the time he has the sensation of not emptying his bladder completely when finished urinating. 50% of the time he has to urinate again fewer than two hours after he has finished urinating. 50% of the time he has to start and stop again several times when he urinates. Almost always he finds it difficult to postpone urination. Almost always he has a weak urinary stream. He has to get up to urinate 1 time from the time he goes to bed until the time he gets up in the morning.     QOL Score: He would feel mixed if he had to live with his urinary condition the way it is now for the rest of his life.   Calculated QOL Symptom Score: 3      Statement of  Likelihood of Success: Excellent. TIME-OUT observed.:  Procedure: Cystourethroscopy was a copy, showing a prostate with enlarged lateral lobes, left greater than right. Within the bladder, the 0 was in normal position, the bladder neck was elevated, and the trigone was in normal position with clear reflux of both  cervices. Moderate trabeculation was noted. Early cellule formation was identified. There was no diverticular formation noted. There was no evidence of bladder stone or tumor formation.  A 1000  fiber was selected, with laser settings of 80, and 120. Using the thulium laser, and keeping the veru identified, a trench was formed at the 7:00 position, from the bladder neck, to a point 0.1 cm proximal to the Veru, opening up the bladder neck. A second trench was then created, at the 5:00  position, beginning at the bladder neck, and running to a 0.1 cm proximal to the Arthur Weaver. The prostatic tissue at the bladder neck was then vaporized. The left lateral lobe was greatly enlarged, and this was vaporized. Some bleeding was noted from the 5:00 position, and this was lased. Minimal amount of the right lateral prostate lobe was necessary to be removed.  Minimal bleeding was noted at the end of procedure, and a size 22 coud hematuria catheter was passed in the bladder, and the bladder irrigated with clear saline. No clots were identified. Precautionary traction was placed. The patient left the operating room with continuous irrigation, which will be removed in the recovery room. The patient will be allowed to be discharged in the care of his sister, for removal of his traction in a.m., and will return for removal of his Foley catheter in 3 days. Note that he received IV antibiotics, as well as IV Toradol.

## 2016-04-21 NOTE — Discharge Instructions (Addendum)
Benign Prostatic Hyperplasia °Benign prostatic hyperplasia is when the prostate gland is bigger than normal (enlarged). The prostate is a gland that produces the fluid that goes into semen. It is near the opening to the bladder and it surrounds the tube that drains urine out of the body (urethra). Benign prostatic hyperplasia is common among older men and it typically causes problems with urinating. °The prostate grows slowly as you age. As the prostate grows, it can pinch the urethra. This causes the bladder to work too hard to pass urine, which leads to a thickened bladder wall. The bladder may eventually become weak and unable to empty completely. °What are the causes? °The exact cause of this condition is not known. It may be related to changes in hormones as the body ages. °What increases the risk? °You are more likely to develop this condition if: °· You have a family history of the condition. °· You are age 40 or older. °· You have a history of erectile dysfunction. °· You do not exercise. °· You have certain medical conditions, including: °¨ Type 2 diabetes. °¨ Obesity. °¨ Heart and circulatory disease. °What are the signs or symptoms? °Symptoms of this condition include: °· Weak or interrupted urine stream. °· Dribbling or leaking urine. °· Feeling like the bladder has not emptied completely. °· Difficulty starting urination. °· Getting up frequently at night to urinate. °· Urinating more often (8 or more times a day). °· Accidental loss of urine (urinary incontinence). °· Pain during urination or ejaculation. °· Urine with an unusual smell or color. °The size of the prostate does not always determine the severity of the symptoms. For example, a man with a large prostate may experience minor symptoms, or a man with a smaller prostate may experience a severe blockage. °How is this diagnosed? °This condition may be diagnosed based on: °· Your medical history and symptoms. °· A physical exam. This usually  includes a digital rectal exam. During this exam, your health care provider places a gloved, lubricated finger into the rectum to feel the size of the prostate. °· A blood test. This test checks for high levels of a protein that is produced by the prostate (prostate specific antigen, PSA). °· Tests to examine how well the urethra and bladder are functioning (urodynamic tests). °· Cystoscopy. For this test, a small, tube-shaped instrument (cystoscope) is used to look inside the urethra and bladder. The cystoscope is placed into the urinary tract through the opening at the tip of the penis. °· Urine tests. °· Ultrasound. °How is this treated? °Treatment for this condition depends on how severe your symptoms are. Treatment may include: °· Active surveillance or "watchful waiting." If your symptoms are mild, your health care provider may delay treatment and ask you to keep track of your symptoms. You will have regular checkups to examine the size of your prostate, discuss symptoms, and determine whether treatment is needed. °· Medicines. These may be used to: °¨ Stop prostate growth. °¨ Shrink the prostate. °¨ Relieve symptoms. °· Lifestyle changes, including: °¨ Pelvic floor muscle exercises. The pelvic floor muscles are a group of muscles that relax when you urinate. °¨ Bladder training. This involves exercises that train the bladder to hold more urine for longer periods. °¨ Reducing the amount of liquid that you drink. This is especially important before sleeping and before long periods of time spent in public. °¨ Reducing the amount of caffeine and alcohol that you drink. °¨ Treating or preventing constipation. °·   Surgery to reduce the size of the prostate or widen the urethra. This is typically done if your symptoms are severe or there are serious complications from the enlarged prostate. Follow these instructions at home: Medicines   Take over-the-counter and prescription medicines as told by your health care  provider.  Avoid certain medicines, such as decongestants, antihistamines, and some prescription medicines as told by your health care provider. Ask your health care provider which medicines you should avoid. General instructions   Monitor your symptoms for any changes. Tell your health care provider about any changes.  Give yourself time when you urinate.  Avoid certain beverages that can irritate the bladder, such as:  Alcohol.  Caffeinated drinks like coffee, tea, and cola.  Avoid drinking large amounts of liquid before bed or before going out in public.  Do pelvic floor muscle or bladder training exercises as told by your health care provider.  Keep all follow-up visits as told by your health care provider. This is important. Contact a health care provider if:  Your develop new or worse symptoms.  You have trouble getting or maintaining an erection.  You have a fever.  You have pain or burning during urination.  You have blood in your urine. Get help right away if:  You have severe pain when urinating.  You cannot urinate.  You have severe pain in your abdomen.  You are dizzy.  You faint.  You have severe back pain.  Your urine is dark red and difficult to see through.  You have large blood clots in your urine.  You have severe pain after an erection.  You have chest pain, dizziness, or nausea during sexual activity. Summary  The prostate is a gland that produces the fluid that goes into semen. It is near the opening to the bladder and it surrounds the tube that drains urine out of the body (urethra).  Benign prostatic hyperplasia is common among older men and it typically causes problems with urinating.  If your symptoms are mild, your health care provider may delay treatment and ask you to keep track of your symptoms. You will have regular checkups to examine the size of your prostate, discuss symptoms, and determine whether treatment is needed.  If  directed, you may need to avoid certain medicines, such as decongestants, antihistamines, and some prescription medicines.  Contact your health care provider if you develop new or worse symptoms. This information is not intended to replace advice given to you by your health care provider. Make sure you discuss any questions you have with your health care provider. Document Released: 01/24/2005 Document Revised: 12/14/2015 Document Reviewed: 12/14/2015   Elsevier Interactive Patient Education  2017 [facility] Instructions  Activity: Get plenty of rest for the remainder of the day. A responsible adult should stay with you for 24 hours following the procedure.  For the next 24 hours, DO NOT: -Drive a car -Paediatric nurse -Drink alcoholic beverages -Take any medication unless instructed by your physician -Make any legal decisions or sign important papers.  Meals: Start with liquid foods such as gelatin or soup. Progress to regular foods as tolerated. Avoid greasy, spicy, heavy foods. If nausea and/or vomiting occur, drink only clear liquids until the nausea and/or vomiting subsides. Call your physician if vomiting continues.  Special Instructions/Symptoms: Your throat may feel dry or sore from the anesthesia or the breathing tube placed in your throat during surgery. If this causes discomfort, gargle with  warm salt water. The discomfort should disappear within 24 hours.  If you had a scopolamine patch placed behind your ear for the management of post- operative nausea and/or vomiting:  1. The medication in the patch is effective for 72 hours, after which it should be removed.  Wrap patch in a tissue and discard in the trash. Wash hands thoroughly with soap and water. 2. You may remove the patch earlier than 72 hours if you experience unpleasant side effects which may include dry mouth, dizziness or visual disturbances. 3. Avoid touching the patch. Wash  your hands with soap and water after contact with the patch.    Indwelling Urinary Catheter Care, Adult Take good care of your catheter to keep it working and to prevent problems. How to wear your catheter Attach your catheter to your leg with tape (adhesive tape) or a leg strap. Make sure it is not too tight. If you use tape, remove any bits of tape that are already on the catheter. How to wear a drainage bag You should have:  A large overnight bag.  A small leg bag. Overnight Bag  You may wear the overnight bag at any time. Always keep the bag below the level of your bladder but off the floor. When you sleep, put a clean plastic bag in a wastebasket. Then hang the bag inside the wastebasket. Leg Bag  Never wear the leg bag at night. Always wear the leg bag below your knee. Keep the leg bag secure with a leg strap or tape. How to care for your skin  Clean the skin around the catheter at least once every day.  Shower every day. Do not take baths.  Put creams, lotions, or ointments on your genital area only as told by your doctor.  Do not use powders, sprays, or lotions on your genital area. How to clean your catheter and your skin 1. Wash your hands with soap and water. 2. Wet a washcloth in warm water and gentle (mild) soap. 3. Use the washcloth to clean the skin where the catheter enters your body. Clean downward and wipe away from the catheter in small circles. Do not wipe toward the catheter. 4. Pat the area dry with a clean towel. Make sure to clean off all soap. How to care for your drainage bags Empty your drainage bag when it is ?- full or at least 2-3 times a day. Replace your drainage bag once a month or sooner if it starts to smell bad or look dirty. Do not clean your drainage bag unless told by your doctor. Emptying a drainage bag   Supplies Needed  Rubbing alcohol.  Gauze pad or cotton ball.  Tape or a leg strap. Steps 1. Wash your hands with soap and  water. 2. Separate (detach) the bag from your leg. 3. Hold the bag over the toilet or a clean container. Keep the bag below your hips and bladder. This stops pee (urine) from going back into the tube. 4. Open the pour spout at the bottom of the bag. 5. Empty the pee into the toilet or container. Do not let the pour spout touch any surface. 6. Put rubbing alcohol on a gauze pad or cotton ball. 7. Use the gauze pad or cotton ball to clean the pour spout. 8. Close the pour spout. 9. Attach the bag to your leg with tape or a leg strap. 10. Wash your hands. Changing a drainage bag  Supplies Needed  Alcohol wipes.  A clean drainage bag.  Adhesive tape or a leg strap. Steps 1. Wash your hands with soap and water. 2. Separate the dirty bag from your leg. 3. Pinch the rubber catheter with your fingers so that pee does not spill out. 4. Separate the catheter tube from the drainage tube where these tubes connect (at the connection valve). Do not let the tubes touch any surface. 5. Clean the end of the catheter tube with an alcohol wipe. Use a different alcohol wipe to clean the end of the drainage tube. 6. Connect the catheter tube to the drainage tube of the clean bag. 7. Attach the new bag to the leg with adhesive tape or a leg strap. 8. Wash your hands. How to prevent infection and other problems  Never pull on your catheter or try to remove it. Pulling can damage tissue in your body.  Always wash your hands before and after touching your catheter.  If a leg strap gets wet, replace it with a dry one.  Drink enough fluids to keep your pee clear or pale yellow, or as told by your doctor.  Do not let the drainage bag or tubing touch the floor.  Wear cotton underwear.  If you are male, wipe from front to back after you poop (have a bowel movement).  Check on the catheter often to make sure it works and the tubing is not twisted. Get help if:  Your pee is cloudy.  Your pee smells  unusually bad.  Your pee is not draining into the bag.  Your tube gets clogged.  Your catheter starts to leak.  Your bladder feels full. Get help right away if:  You have redness, swelling, or pain where the catheter enters your body.  You have fluid, pus, or a bad smell coming from the area where the catheter enters your body.  The area where the catheter enters your body feels warm.  You have a fever.  You have pain in your:  Stomach (abdomen).  Legs.  Lower back.  Bladder.  You see blood fill the catheter.  Your pee is pink or red.  You feel sick to your stomach (nauseous).  You throw up (vomit).  You have chills.  Your catheter gets pulled out. This information is not intended to replace advice given to you by your health care provider. Make sure you discuss any questions you have with your health care provider. Document Released: 05/21/2012 Document Revised: 12/23/2015 Document Reviewed: 07/09/2013 Elsevier Interactive Patient Education  2017 [employer].

## 2016-04-21 NOTE — Interval H&P Note (Signed)
History and Physical Interval Note:  04/21/2016 7:23 AM  [name]  has presented today for surgery, with the diagnosis of BENIGN PROSTATIC HYPERPLASIA  The various methods of treatment have been discussed with the patient and family. After consideration of risks, benefits and other options for treatment, the patient has consented to  Procedure(s): THULIUM LASER TURP (TRANSURETHRAL RESECTION OF PROSTATE) (N/A) as a surgical intervention .  The patient's history has been reviewed, patient examined, no change in status, stable for surgery.  I have reviewed the patient's chart and labs.  Questions were answered to the patient's satisfaction.     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 He has had urodynamic evaluation, demonstrating high detrusor/low flow state, with cystoscopy showing obstructing prostatic tissue. He is now for thulium laser prostatectomy. He will have foley catheter post operatively for the weekend, and will RTC for voiding trial Monday. I have again discussed the surgery with the patinent and with his sister ( POA), and all are in agreement.

## 2016-04-21 NOTE — Transfer of Care (Signed)
Immediate Anesthesia Transfer of Care Note  Patient: Arthur Weaver  Procedure(s) Performed: Procedure(s): THULIUM LASER TURP (TRANSURETHRAL RESECTION OF PROSTATE) (N/A)  Patient Location: PACU  Anesthesia Type:General  Level of Consciousness: awake, alert , oriented and patient cooperative  Airway & Oxygen Therapy: Patient Spontanous Breathing and Patient connected to nasal cannula oxygen  Post-op Assessment: Report given to RN and Post -op Vital signs reviewed and stable  Post vital signs: Reviewed and stable  Last Vitals:  Vitals:   04/21/16 0610  BP: (!) 164/92  Pulse: 94  Resp: 16  Temp: 36.7 C    Last Pain:  Vitals:   04/21/16 0610  TempSrc: Oral      Patients Stated Pain Goal: 5 (94/[id number]/96 [id number])  Complications: No apparent anesthesia complications

## 2016-04-22 ENCOUNTER — Encounter (HOSPITAL_BASED_OUTPATIENT_CLINIC_OR_DEPARTMENT_OTHER): Payer: Self-pay | Admitting: Urology

## 2016-05-02 DIAGNOSIS — N401 Enlarged prostate with lower urinary tract symptoms: Secondary | ICD-10-CM | POA: Diagnosis not present

## 2016-05-02 DIAGNOSIS — R3915 Urgency of urination: Secondary | ICD-10-CM | POA: Diagnosis not present

## 2016-05-05 DIAGNOSIS — F71 Moderate intellectual disabilities: Secondary | ICD-10-CM | POA: Diagnosis not present

## 2016-05-06 DIAGNOSIS — F71 Moderate intellectual disabilities: Secondary | ICD-10-CM | POA: Diagnosis not present

## 2016-05-07 DIAGNOSIS — F71 Moderate intellectual disabilities: Secondary | ICD-10-CM | POA: Diagnosis not present

## 2016-05-08 DIAGNOSIS — F71 Moderate intellectual disabilities: Secondary | ICD-10-CM | POA: Diagnosis not present

## 2016-05-09 DIAGNOSIS — F71 Moderate intellectual disabilities: Secondary | ICD-10-CM | POA: Diagnosis not present

## 2016-05-10 DIAGNOSIS — F71 Moderate intellectual disabilities: Secondary | ICD-10-CM | POA: Diagnosis not present

## 2016-05-11 DIAGNOSIS — F71 Moderate intellectual disabilities: Secondary | ICD-10-CM | POA: Diagnosis not present

## 2016-05-12 DIAGNOSIS — F71 Moderate intellectual disabilities: Secondary | ICD-10-CM | POA: Diagnosis not present

## 2016-05-13 DIAGNOSIS — F71 Moderate intellectual disabilities: Secondary | ICD-10-CM | POA: Diagnosis not present

## 2016-05-14 DIAGNOSIS — F71 Moderate intellectual disabilities: Secondary | ICD-10-CM | POA: Diagnosis not present

## 2016-05-15 DIAGNOSIS — F71 Moderate intellectual disabilities: Secondary | ICD-10-CM | POA: Diagnosis not present

## 2016-05-16 DIAGNOSIS — F71 Moderate intellectual disabilities: Secondary | ICD-10-CM | POA: Diagnosis not present

## 2016-05-17 DIAGNOSIS — F71 Moderate intellectual disabilities: Secondary | ICD-10-CM | POA: Diagnosis not present

## 2016-05-18 DIAGNOSIS — F71 Moderate intellectual disabilities: Secondary | ICD-10-CM | POA: Diagnosis not present

## 2016-05-19 DIAGNOSIS — F71 Moderate intellectual disabilities: Secondary | ICD-10-CM | POA: Diagnosis not present

## 2016-05-20 DIAGNOSIS — F71 Moderate intellectual disabilities: Secondary | ICD-10-CM | POA: Diagnosis not present

## 2016-05-21 DIAGNOSIS — F71 Moderate intellectual disabilities: Secondary | ICD-10-CM | POA: Diagnosis not present

## 2016-05-22 DIAGNOSIS — F71 Moderate intellectual disabilities: Secondary | ICD-10-CM | POA: Diagnosis not present

## 2016-05-23 DIAGNOSIS — F71 Moderate intellectual disabilities: Secondary | ICD-10-CM | POA: Diagnosis not present

## 2016-05-24 DIAGNOSIS — F71 Moderate intellectual disabilities: Secondary | ICD-10-CM | POA: Diagnosis not present

## 2016-05-25 DIAGNOSIS — F71 Moderate intellectual disabilities: Secondary | ICD-10-CM | POA: Diagnosis not present

## 2016-05-26 DIAGNOSIS — F71 Moderate intellectual disabilities: Secondary | ICD-10-CM | POA: Diagnosis not present

## 2016-05-27 DIAGNOSIS — F71 Moderate intellectual disabilities: Secondary | ICD-10-CM | POA: Diagnosis not present

## 2016-05-28 DIAGNOSIS — F71 Moderate intellectual disabilities: Secondary | ICD-10-CM | POA: Diagnosis not present

## 2016-05-29 DIAGNOSIS — F71 Moderate intellectual disabilities: Secondary | ICD-10-CM | POA: Diagnosis not present

## 2016-05-30 DIAGNOSIS — F71 Moderate intellectual disabilities: Secondary | ICD-10-CM | POA: Diagnosis not present

## 2016-05-31 DIAGNOSIS — E668 Other obesity: Secondary | ICD-10-CM | POA: Diagnosis not present

## 2016-05-31 DIAGNOSIS — E784 Other hyperlipidemia: Secondary | ICD-10-CM | POA: Diagnosis not present

## 2016-05-31 DIAGNOSIS — I10 Essential (primary) hypertension: Secondary | ICD-10-CM | POA: Diagnosis not present

## 2016-05-31 DIAGNOSIS — K219 Gastro-esophageal reflux disease without esophagitis: Secondary | ICD-10-CM | POA: Diagnosis not present

## 2016-05-31 DIAGNOSIS — Z6838 Body mass index (BMI) 38.0-38.9, adult: Secondary | ICD-10-CM | POA: Diagnosis not present

## 2016-05-31 DIAGNOSIS — E119 Type 2 diabetes mellitus without complications: Secondary | ICD-10-CM | POA: Diagnosis not present

## 2016-05-31 DIAGNOSIS — F71 Moderate intellectual disabilities: Secondary | ICD-10-CM | POA: Diagnosis not present

## 2016-05-31 DIAGNOSIS — N401 Enlarged prostate with lower urinary tract symptoms: Secondary | ICD-10-CM | POA: Diagnosis not present

## 2016-05-31 DIAGNOSIS — E559 Vitamin D deficiency, unspecified: Secondary | ICD-10-CM | POA: Diagnosis not present

## 2016-06-01 DIAGNOSIS — F71 Moderate intellectual disabilities: Secondary | ICD-10-CM | POA: Diagnosis not present

## 2016-06-02 DIAGNOSIS — F71 Moderate intellectual disabilities: Secondary | ICD-10-CM | POA: Diagnosis not present

## 2016-06-03 DIAGNOSIS — F71 Moderate intellectual disabilities: Secondary | ICD-10-CM | POA: Diagnosis not present

## 2016-06-04 DIAGNOSIS — F71 Moderate intellectual disabilities: Secondary | ICD-10-CM | POA: Diagnosis not present

## 2016-06-05 DIAGNOSIS — F71 Moderate intellectual disabilities: Secondary | ICD-10-CM | POA: Diagnosis not present

## 2016-06-06 DIAGNOSIS — F71 Moderate intellectual disabilities: Secondary | ICD-10-CM | POA: Diagnosis not present

## 2016-06-07 DIAGNOSIS — F71 Moderate intellectual disabilities: Secondary | ICD-10-CM | POA: Diagnosis not present

## 2016-06-08 DIAGNOSIS — F71 Moderate intellectual disabilities: Secondary | ICD-10-CM | POA: Diagnosis not present

## 2016-06-09 DIAGNOSIS — F71 Moderate intellectual disabilities: Secondary | ICD-10-CM | POA: Diagnosis not present

## 2016-06-10 DIAGNOSIS — F71 Moderate intellectual disabilities: Secondary | ICD-10-CM | POA: Diagnosis not present

## 2016-06-11 DIAGNOSIS — F71 Moderate intellectual disabilities: Secondary | ICD-10-CM | POA: Diagnosis not present

## 2016-06-12 DIAGNOSIS — F71 Moderate intellectual disabilities: Secondary | ICD-10-CM | POA: Diagnosis not present

## 2016-06-13 DIAGNOSIS — F71 Moderate intellectual disabilities: Secondary | ICD-10-CM | POA: Diagnosis not present

## 2016-06-14 ENCOUNTER — Encounter: Payer: Self-pay | Admitting: Podiatry

## 2016-06-14 ENCOUNTER — Ambulatory Visit (INDEPENDENT_AMBULATORY_CARE_PROVIDER_SITE_OTHER): Payer: Medicare HMO | Admitting: Podiatry

## 2016-06-14 DIAGNOSIS — B351 Tinea unguium: Secondary | ICD-10-CM

## 2016-06-14 DIAGNOSIS — M79674 Pain in right toe(s): Secondary | ICD-10-CM

## 2016-06-14 DIAGNOSIS — Q828 Other specified congenital malformations of skin: Secondary | ICD-10-CM

## 2016-06-14 DIAGNOSIS — E0851 Diabetes mellitus due to underlying condition with diabetic peripheral angiopathy without gangrene: Secondary | ICD-10-CM

## 2016-06-14 DIAGNOSIS — L84 Corns and callosities: Secondary | ICD-10-CM

## 2016-06-14 DIAGNOSIS — M79675 Pain in left toe(s): Secondary | ICD-10-CM

## 2016-06-14 DIAGNOSIS — F71 Moderate intellectual disabilities: Secondary | ICD-10-CM | POA: Diagnosis not present

## 2016-06-14 NOTE — Progress Notes (Signed)
Subjective:     Patient ID: Arthur Weaver, male   DOB: 23-Jun-1960, 56 y.o.   MRN: [id number]  HPIThis patient presents to the office with painful long thick nails.  His nails are painful walking and wearing his shoes.  .  He also remarks that his right foot is flat when he walks and wears his shoes. He has painful callus both feet. Patient says the AFO is doing well.   Review of Systems     Objective:   Physical Exam GENERAL APPEARANCE: Alert, conversant. Appropriately groomed. No acute distress.  VASCULAR: Pedal pulses absent  DP and PT bilateral.  Capillary refill time is immediate to all digits,   NEUROLOGIC: sensation is diminished  epicritically and protectively to 5.07 monofilament at 5/5 sites bilateral.  Light touch is intact bilateral.  MUSCULOSKELETAL: acceptable muscle strength, tone and stability bilateral.  Intrinsic muscluature intact bilateral.  Rectus appearance of foot and digits noted bilateral. Medial bulge noted at TNJ right foot. HAV B/L.  Hammer toes 2-4 B/L.  Patient has developed enlarged bone at TNJ right foot.  Swelling noted right ankle.  DERMATOLOGIC: skin color, texture, and turgor are within normal limits.  No preulcerative lesions or ulcers  are seen, no interdigital maceration noted.  No open lesions present.. No drainage noted.  Pinch callus B/L.  Callus sub 1st B/L. Distal clavi 2,3 B/L  NAILS  Thick disfigured discolored nails both feet.     Assessment:     Onychomycosis    Diabetes Mellitus Ankle Arthropathy.    Plan:     Debridement of Nails.  . Debridement of callus. RTC 3 months for nail care.     Gardiner Barefoot DPM

## 2016-06-15 DIAGNOSIS — F71 Moderate intellectual disabilities: Secondary | ICD-10-CM | POA: Diagnosis not present

## 2016-06-16 DIAGNOSIS — F71 Moderate intellectual disabilities: Secondary | ICD-10-CM | POA: Diagnosis not present

## 2016-06-17 DIAGNOSIS — F71 Moderate intellectual disabilities: Secondary | ICD-10-CM | POA: Diagnosis not present

## 2016-06-18 DIAGNOSIS — F71 Moderate intellectual disabilities: Secondary | ICD-10-CM | POA: Diagnosis not present

## 2016-06-19 DIAGNOSIS — F71 Moderate intellectual disabilities: Secondary | ICD-10-CM | POA: Diagnosis not present

## 2016-06-20 DIAGNOSIS — F71 Moderate intellectual disabilities: Secondary | ICD-10-CM | POA: Diagnosis not present

## 2016-06-21 DIAGNOSIS — F71 Moderate intellectual disabilities: Secondary | ICD-10-CM | POA: Diagnosis not present

## 2016-06-22 DIAGNOSIS — F71 Moderate intellectual disabilities: Secondary | ICD-10-CM | POA: Diagnosis not present

## 2016-06-23 DIAGNOSIS — F71 Moderate intellectual disabilities: Secondary | ICD-10-CM | POA: Diagnosis not present

## 2016-06-24 DIAGNOSIS — F71 Moderate intellectual disabilities: Secondary | ICD-10-CM | POA: Diagnosis not present

## 2016-06-25 DIAGNOSIS — F71 Moderate intellectual disabilities: Secondary | ICD-10-CM | POA: Diagnosis not present

## 2016-06-26 DIAGNOSIS — F71 Moderate intellectual disabilities: Secondary | ICD-10-CM | POA: Diagnosis not present

## 2016-06-27 DIAGNOSIS — F71 Moderate intellectual disabilities: Secondary | ICD-10-CM | POA: Diagnosis not present

## 2016-06-28 DIAGNOSIS — F71 Moderate intellectual disabilities: Secondary | ICD-10-CM | POA: Diagnosis not present

## 2016-06-29 DIAGNOSIS — F71 Moderate intellectual disabilities: Secondary | ICD-10-CM | POA: Diagnosis not present

## 2016-06-30 DIAGNOSIS — F71 Moderate intellectual disabilities: Secondary | ICD-10-CM | POA: Diagnosis not present

## 2016-07-01 DIAGNOSIS — F71 Moderate intellectual disabilities: Secondary | ICD-10-CM | POA: Diagnosis not present

## 2016-07-02 DIAGNOSIS — F71 Moderate intellectual disabilities: Secondary | ICD-10-CM | POA: Diagnosis not present

## 2016-07-03 DIAGNOSIS — F71 Moderate intellectual disabilities: Secondary | ICD-10-CM | POA: Diagnosis not present

## 2016-07-04 DIAGNOSIS — F71 Moderate intellectual disabilities: Secondary | ICD-10-CM | POA: Diagnosis not present

## 2016-07-05 DIAGNOSIS — F71 Moderate intellectual disabilities: Secondary | ICD-10-CM | POA: Diagnosis not present

## 2016-07-06 DIAGNOSIS — F71 Moderate intellectual disabilities: Secondary | ICD-10-CM | POA: Diagnosis not present

## 2016-07-07 DIAGNOSIS — F71 Moderate intellectual disabilities: Secondary | ICD-10-CM | POA: Diagnosis not present

## 2016-07-08 DIAGNOSIS — F71 Moderate intellectual disabilities: Secondary | ICD-10-CM | POA: Diagnosis not present

## 2016-07-09 DIAGNOSIS — F71 Moderate intellectual disabilities: Secondary | ICD-10-CM | POA: Diagnosis not present

## 2016-07-10 DIAGNOSIS — F71 Moderate intellectual disabilities: Secondary | ICD-10-CM | POA: Diagnosis not present

## 2016-07-11 DIAGNOSIS — F71 Moderate intellectual disabilities: Secondary | ICD-10-CM | POA: Diagnosis not present

## 2016-07-12 DIAGNOSIS — F71 Moderate intellectual disabilities: Secondary | ICD-10-CM | POA: Diagnosis not present

## 2016-07-13 DIAGNOSIS — F71 Moderate intellectual disabilities: Secondary | ICD-10-CM | POA: Diagnosis not present

## 2016-07-14 DIAGNOSIS — F71 Moderate intellectual disabilities: Secondary | ICD-10-CM | POA: Diagnosis not present

## 2016-07-15 DIAGNOSIS — F71 Moderate intellectual disabilities: Secondary | ICD-10-CM | POA: Diagnosis not present

## 2016-07-16 DIAGNOSIS — F71 Moderate intellectual disabilities: Secondary | ICD-10-CM | POA: Diagnosis not present

## 2016-07-17 DIAGNOSIS — F71 Moderate intellectual disabilities: Secondary | ICD-10-CM | POA: Diagnosis not present

## 2016-07-18 DIAGNOSIS — F71 Moderate intellectual disabilities: Secondary | ICD-10-CM | POA: Diagnosis not present

## 2016-07-19 DIAGNOSIS — N401 Enlarged prostate with lower urinary tract symptoms: Secondary | ICD-10-CM | POA: Diagnosis not present

## 2016-07-19 DIAGNOSIS — R3912 Poor urinary stream: Secondary | ICD-10-CM | POA: Diagnosis not present

## 2016-07-19 DIAGNOSIS — F71 Moderate intellectual disabilities: Secondary | ICD-10-CM | POA: Diagnosis not present

## 2016-07-20 DIAGNOSIS — F71 Moderate intellectual disabilities: Secondary | ICD-10-CM | POA: Diagnosis not present

## 2016-07-21 DIAGNOSIS — F71 Moderate intellectual disabilities: Secondary | ICD-10-CM | POA: Diagnosis not present

## 2016-07-22 DIAGNOSIS — F71 Moderate intellectual disabilities: Secondary | ICD-10-CM | POA: Diagnosis not present

## 2016-07-23 DIAGNOSIS — F71 Moderate intellectual disabilities: Secondary | ICD-10-CM | POA: Diagnosis not present

## 2016-07-24 DIAGNOSIS — F71 Moderate intellectual disabilities: Secondary | ICD-10-CM | POA: Diagnosis not present

## 2016-07-25 DIAGNOSIS — F71 Moderate intellectual disabilities: Secondary | ICD-10-CM | POA: Diagnosis not present

## 2016-07-26 DIAGNOSIS — F71 Moderate intellectual disabilities: Secondary | ICD-10-CM | POA: Diagnosis not present

## 2016-07-27 DIAGNOSIS — F71 Moderate intellectual disabilities: Secondary | ICD-10-CM | POA: Diagnosis not present

## 2016-07-28 DIAGNOSIS — F71 Moderate intellectual disabilities: Secondary | ICD-10-CM | POA: Diagnosis not present

## 2016-07-29 DIAGNOSIS — F71 Moderate intellectual disabilities: Secondary | ICD-10-CM | POA: Diagnosis not present

## 2016-07-30 DIAGNOSIS — F71 Moderate intellectual disabilities: Secondary | ICD-10-CM | POA: Diagnosis not present

## 2016-07-31 DIAGNOSIS — F71 Moderate intellectual disabilities: Secondary | ICD-10-CM | POA: Diagnosis not present

## 2016-08-01 DIAGNOSIS — F71 Moderate intellectual disabilities: Secondary | ICD-10-CM | POA: Diagnosis not present

## 2016-08-02 DIAGNOSIS — F71 Moderate intellectual disabilities: Secondary | ICD-10-CM | POA: Diagnosis not present

## 2016-08-03 DIAGNOSIS — F71 Moderate intellectual disabilities: Secondary | ICD-10-CM | POA: Diagnosis not present

## 2016-08-04 DIAGNOSIS — F71 Moderate intellectual disabilities: Secondary | ICD-10-CM | POA: Diagnosis not present

## 2016-08-05 DIAGNOSIS — F71 Moderate intellectual disabilities: Secondary | ICD-10-CM | POA: Diagnosis not present

## 2016-08-06 DIAGNOSIS — F71 Moderate intellectual disabilities: Secondary | ICD-10-CM | POA: Diagnosis not present

## 2016-08-07 DIAGNOSIS — F71 Moderate intellectual disabilities: Secondary | ICD-10-CM | POA: Diagnosis not present

## 2016-08-22 DIAGNOSIS — F71 Moderate intellectual disabilities: Secondary | ICD-10-CM | POA: Diagnosis not present

## 2016-08-23 DIAGNOSIS — F71 Moderate intellectual disabilities: Secondary | ICD-10-CM | POA: Diagnosis not present

## 2016-08-24 DIAGNOSIS — F71 Moderate intellectual disabilities: Secondary | ICD-10-CM | POA: Diagnosis not present

## 2016-08-25 DIAGNOSIS — F71 Moderate intellectual disabilities: Secondary | ICD-10-CM | POA: Diagnosis not present

## 2016-08-26 DIAGNOSIS — F71 Moderate intellectual disabilities: Secondary | ICD-10-CM | POA: Diagnosis not present

## 2016-08-27 DIAGNOSIS — F71 Moderate intellectual disabilities: Secondary | ICD-10-CM | POA: Diagnosis not present

## 2016-08-28 DIAGNOSIS — F71 Moderate intellectual disabilities: Secondary | ICD-10-CM | POA: Diagnosis not present

## 2016-08-29 DIAGNOSIS — F71 Moderate intellectual disabilities: Secondary | ICD-10-CM | POA: Diagnosis not present

## 2016-08-30 DIAGNOSIS — F71 Moderate intellectual disabilities: Secondary | ICD-10-CM | POA: Diagnosis not present

## 2016-08-31 DIAGNOSIS — F71 Moderate intellectual disabilities: Secondary | ICD-10-CM | POA: Diagnosis not present

## 2016-09-01 DIAGNOSIS — F71 Moderate intellectual disabilities: Secondary | ICD-10-CM | POA: Diagnosis not present

## 2016-09-02 DIAGNOSIS — F71 Moderate intellectual disabilities: Secondary | ICD-10-CM | POA: Diagnosis not present

## 2016-09-03 DIAGNOSIS — F71 Moderate intellectual disabilities: Secondary | ICD-10-CM | POA: Diagnosis not present

## 2016-09-04 DIAGNOSIS — F71 Moderate intellectual disabilities: Secondary | ICD-10-CM | POA: Diagnosis not present

## 2016-09-05 ENCOUNTER — Ambulatory Visit: Payer: Medicare HMO | Admitting: Rheumatology

## 2016-09-05 DIAGNOSIS — F71 Moderate intellectual disabilities: Secondary | ICD-10-CM | POA: Diagnosis not present

## 2016-09-06 DIAGNOSIS — F71 Moderate intellectual disabilities: Secondary | ICD-10-CM | POA: Diagnosis not present

## 2016-09-07 DIAGNOSIS — F71 Moderate intellectual disabilities: Secondary | ICD-10-CM | POA: Diagnosis not present

## 2016-09-08 DIAGNOSIS — Z125 Encounter for screening for malignant neoplasm of prostate: Secondary | ICD-10-CM | POA: Diagnosis not present

## 2016-09-08 DIAGNOSIS — E784 Other hyperlipidemia: Secondary | ICD-10-CM | POA: Diagnosis not present

## 2016-09-08 DIAGNOSIS — R8299 Other abnormal findings in urine: Secondary | ICD-10-CM | POA: Diagnosis not present

## 2016-09-08 DIAGNOSIS — F71 Moderate intellectual disabilities: Secondary | ICD-10-CM | POA: Diagnosis not present

## 2016-09-08 DIAGNOSIS — E119 Type 2 diabetes mellitus without complications: Secondary | ICD-10-CM | POA: Diagnosis not present

## 2016-09-08 DIAGNOSIS — E559 Vitamin D deficiency, unspecified: Secondary | ICD-10-CM | POA: Diagnosis not present

## 2016-09-09 DIAGNOSIS — F71 Moderate intellectual disabilities: Secondary | ICD-10-CM | POA: Diagnosis not present

## 2016-09-10 DIAGNOSIS — F71 Moderate intellectual disabilities: Secondary | ICD-10-CM | POA: Diagnosis not present

## 2016-09-11 DIAGNOSIS — F71 Moderate intellectual disabilities: Secondary | ICD-10-CM | POA: Diagnosis not present

## 2016-09-12 DIAGNOSIS — F71 Moderate intellectual disabilities: Secondary | ICD-10-CM | POA: Diagnosis not present

## 2016-09-13 DIAGNOSIS — F71 Moderate intellectual disabilities: Secondary | ICD-10-CM | POA: Diagnosis not present

## 2016-09-14 ENCOUNTER — Encounter: Payer: Self-pay | Admitting: Rheumatology

## 2016-09-14 ENCOUNTER — Ambulatory Visit (INDEPENDENT_AMBULATORY_CARE_PROVIDER_SITE_OTHER): Payer: Medicare HMO | Admitting: Podiatry

## 2016-09-14 ENCOUNTER — Ambulatory Visit (INDEPENDENT_AMBULATORY_CARE_PROVIDER_SITE_OTHER): Payer: Medicare HMO | Admitting: Rheumatology

## 2016-09-14 ENCOUNTER — Encounter: Payer: Self-pay | Admitting: Podiatry

## 2016-09-14 VITALS — BP 167/89 | HR 76 | Ht 69.0 in | Wt 239.0 lb

## 2016-09-14 DIAGNOSIS — B351 Tinea unguium: Secondary | ICD-10-CM | POA: Diagnosis not present

## 2016-09-14 DIAGNOSIS — M19042 Primary osteoarthritis, left hand: Secondary | ICD-10-CM

## 2016-09-14 DIAGNOSIS — E6609 Other obesity due to excess calories: Secondary | ICD-10-CM | POA: Diagnosis not present

## 2016-09-14 DIAGNOSIS — M25571 Pain in right ankle and joints of right foot: Secondary | ICD-10-CM

## 2016-09-14 DIAGNOSIS — M19041 Primary osteoarthritis, right hand: Secondary | ICD-10-CM | POA: Diagnosis not present

## 2016-09-14 DIAGNOSIS — M79676 Pain in unspecified toe(s): Secondary | ICD-10-CM

## 2016-09-14 DIAGNOSIS — Z6839 Body mass index (BMI) 39.0-39.9, adult: Secondary | ICD-10-CM

## 2016-09-14 DIAGNOSIS — F71 Moderate intellectual disabilities: Secondary | ICD-10-CM | POA: Diagnosis not present

## 2016-09-14 DIAGNOSIS — M170 Bilateral primary osteoarthritis of knee: Secondary | ICD-10-CM | POA: Diagnosis not present

## 2016-09-14 MED ORDER — DICLOFENAC SODIUM 1 % TD GEL: TRANSDERMAL | 3 refills | Status: DC

## 2016-09-14 NOTE — Progress Notes (Signed)
Office Visit Note  Patient: Arthur Weaver             Date of Birth: 1960/11/28           MRN: [id number]             PCP: Velna Hatchet, MD Referring: Velna Hatchet, MD Visit Date: 09/14/2016 Occupation: @GUAROCC @    Subjective:  Foot Pain (Right foot pain, increased 2 months, has been using ice. most pain in arch of right foot. wearing boot. )   History of Present Illness: Arthur Weaver is a 56 y.o. male  C/o right foot pain (arch is collapsed and has developed OA arthritis in the collapsed arch).  Doing well overall except for left knee pain off and on.  Activities of Daily Living:  Patient reports morning stiffness for 30 minutes.   Patient Reports nocturnal pain.  Difficulty dressing/grooming: Denies Difficulty climbing stairs: Reports Difficulty getting out of chair: Reports Difficulty using hands for taps, buttons, cutlery, and/or writing: Reports   Review of Systems  Constitutional: Negative for fatigue.  HENT: Negative for mouth sores and mouth dryness.   Eyes: Negative for dryness.  Respiratory: Negative for shortness of breath.   Gastrointestinal: Negative for constipation and diarrhea.  Musculoskeletal: Positive for arthralgias (right foot and left knee) and joint pain (right foot and left knee). Negative for myalgias and myalgias.  Skin: Negative for sensitivity to sunlight.  Neurological: Negative for memory loss.  Psychiatric/Behavioral: Negative for sleep disturbance.    PMFS History:  Patient Active Problem List   Diagnosis Date Noted  . 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 Tendonitis 09/25/2015  . High blood pressure 09/07/2010  . High cholesterol 09/07/2010  . Bilateral swelling of feet 09/07/2010  . Diabetes mellitus 09/07/2010  . Rheumatoid arthritis(714.0) 09/07/2010  . Personal history of colonic polyps 10/27/2004    Past Medical History:  Diagnosis Date  . At risk for sleep apnea    STOP-BANG= 5       SENT TO PCP 04-14-2016  .  BPH (benign prostatic hyperplasia)   . DDD (degenerative disc disease), cervical   . DDD (degenerative disc disease), lumbar   . Diverticulosis of colon   . Family history of factor V deficiency    per pt's sister (whom is pt's guardian) she, pt's brother and parents have factor V but pt has never been tested  . Frequency of urination   . GERD (gastroesophageal reflux disease)   . History of adenomatous polyp of colon    2006 and 03-18-2010 tubular adenoma  . Hyperlipidemia   . Hypertension   . Lives in independent group home resides at [facility] in [place], [place]   pt independant w/ ADLs w/ exception does not cook for shop   . Mentally challenged    SPECIAL NEEDS  . OA (osteoarthritis)    hands, knees, feet  . Pre-diabetes   . Thoracic spondylosis   . Urgency of urination   . Wears partial dentures    lower    Family History  Problem Relation Age of Onset  . Colon cancer Neg Hx    Past Surgical History:  Procedure Laterality Date  . CATARACT EXTRACTION W/ INTRAOCULAR LENS  IMPLANT, BILATERAL  2006  . CIRCUMCISION  03/29/2007   W/  CYSTOSCOPY AND TRANSRECTAL ULTRASOUND GUIDED PROSTATE BX'S  . COLONOSCOPY  last one 08-26-2015  . THULIUM LASER TURP (TRANSURETHRAL RESECTION OF PROSTATE) N/A 04/21/2016   Procedure: Marcelino Duster LASER  TURP (TRANSURETHRAL RESECTION OF PROSTATE);  Surgeon: Carolan Clines, MD;  Location: [facility];  Service: Urology;  Laterality: N/A;   Social History   Social History Narrative  . No narrative on file     Objective: Vital Signs: BP (!) 167/89 (BP Location: Left Arm, Patient Position: Sitting, Cuff Size: Normal)   Pulse 76   Ht 5\' 9"  (1.753 m)   Wt 239 lb (108.4 kg)   BMI 35.29 kg/m    Physical Exam  Constitutional: He is oriented to person, place, and time. He appears well-developed and well-nourished.  HENT:  Head: Normocephalic and atraumatic.  Eyes: Pupils are equal, round, and reactive to light.  Conjunctivae and EOM are normal.  Neck: Normal range of motion. Neck supple.  Cardiovascular: Normal rate, regular rhythm and normal heart sounds.  Exam reveals no gallop and no friction rub.   No murmur heard. Pulmonary/Chest: Effort normal and breath sounds normal. No respiratory distress. He has no wheezes. He has no rales. He exhibits no tenderness.  Abdominal: Soft. He exhibits no distension and no mass. There is no tenderness. There is no guarding.  Musculoskeletal: Normal range of motion. Tenderness: right foot and left knee.  Lymphadenopathy:    He has no cervical adenopathy.  Neurological: He is alert and oriented to person, place, and time. He exhibits normal muscle tone. Coordination normal.  Skin: Skin is warm and dry. Capillary refill takes less than 2 seconds. No rash noted.  Psychiatric: He has a normal mood and affect. His behavior is normal. Judgment and thought content normal.  Vitals reviewed.    Musculoskeletal Exam:  Full range of motion of all joints Grip strength is equal and strong bilaterally Fiber myalgia tender points are absent  CDAI Exam: CDAI Homunculus Exam:   Joint Counts:  CDAI Tender Joint count: 0 CDAI Swollen Joint count: 0  Global Assessments:  Patient Global Assessment: 5 Provider Global Assessment: 6  CDAI Calculated Score: 11    Investigation: No additional findings.   pcp recently did a physical and  Labs last week and sister will get  [place] a copy.  Admission on 04/21/2016, Discharged on 04/21/2016  Component Date Value Ref Range Status  . Sodium 04/21/2016 140  135 - 145 mmol/L Final  . Potassium 04/21/2016 3.7  3.5 - 5.1 mmol/L Final  . Glucose, Bld 04/21/2016 124* 65 - 99 mg/dL Final  . HCT 04/21/2016 41.0  39.0 - 52.0 % Final  . Hemoglobin 04/21/2016 13.9  13.0 - 17.0 g/dL Final  . Glucose-Capillary 04/21/2016 138* 65 - 99 mg/dL Final     Imaging: No results found.  Speciality Comments: No specialty comments  available.    Procedures:  No procedures performed Allergies: Penicillins; Pork-derived products; and Sulfa antibiotics   Assessment / Plan:     Visit Diagnoses: Primary osteoarthritis of both knees  Primary osteoarthritis of both hands  Class 2 obesity due to excess calories without serious comorbidity with body mass index (BMI) of 39.0 to 39.9 in adult  Pain in right ankle and joints of right foot   Plan: #1: OA of bilateral knees and hands. #2: Obesity. #3: Patient has pain to the right ankle and foot. History of high arch. Being seen by diabetes. Wearing her brace provided by podiatrist. Having pain off and on. I've advised patient to use Voltaren gel to the area. He lives in a group home. I've written note/directions to the group home giving directions on how to use the  Voltaren gel. In addition, he is also having left knee pain off and on. They will also use Voltaren gel to that area. The site have 3 inches up to 3 times a day of Voltaren gel. I've given him a refill on Voltaren gel. 5 tubes with 3 refills  #4: Return to clinic in 6 months  Orders: No orders of the defined types were placed in this encounter.  Meds ordered this encounter  Medications  . diclofenac sodium (VOLTAREN) 1 % GEL    Sig: Voltaren Gel 3 grams to 3 large joints upto TID 3 TUBES with 3 refills    Dispense:  5 Tube    Refill:  3    Voltaren Gel 3 grams to 3 large joints upto TID 3 TUBES with 3 refills    Order Specific Question:   Supervising Provider    Answer:   Lyda Perone    Face-to-face time spent with patient was 30 minutes. 50% of time was spent in counseling and coordination of care.  Follow-Up Instructions: Return in about 6 months (around 03/17/2017) for oa hands, kj // right foot pain // left knee pain.   Eliezer Lofts, PA-C  Note - This record has been created using [employer].  Chart creation errors have been sought, but may not always  have been  located. Such creation errors do not reflect on  the standard of medical care.

## 2016-09-14 NOTE — Progress Notes (Signed)
Subjective:     Patient ID: Arthur Weaver, male   DOB: 26-Feb-1960, 56 y.o.   MRN: [id number]  HPIThis patient presents to the office with painful long thick nails.  His nails are painful walking and wearing his shoes.  .  He also remarks that his right foot is flat when he walks and wears his shoes. He has painful callus both feet. Patient says he is wearing his diabetic shoes and [employer].  He presents for preventative foot care services.   Review of Systems     Objective:   Physical Exam GENERAL APPEARANCE: Alert, conversant. Appropriately groomed. No acute distress.  VASCULAR: Pedal pulses absent  DP and PT bilateral.  Capillary refill time is immediate to all digits,   NEUROLOGIC: sensation is diminished  epicritically and protectively to 5.07 monofilament at 5/5 sites bilateral.  Light touch is intact bilateral.  MUSCULOSKELETAL: acceptable muscle strength, tone and stability bilateral.  Intrinsic muscluature intact bilateral.  Rectus appearance of foot and digits noted bilateral. Medial bulge noted at TNJ right foot. HAV B/L.  Hammer toes 2-4 B/L.  Patient has developed enlarged bone at TNJ right foot.  Swelling noted right ankle.  DERMATOLOGIC: skin color, texture, and turgor are within normal limits.  No preulcerative lesions or ulcers  are seen, no interdigital maceration noted.  No open lesions present.. No drainage noted.  Pinch callus B/L.  Callus sub 1st B/L. Distal clavi 2,3 B/L  NAILS  Thick disfigured discolored nails both feet.     Assessment:     Onychomycosis    Diabetes Mellitus Ankle Arthropathy.    Plan:     Debridement of Nails.  . Debridement of callus. RTC 3 months for nail care.     Gardiner Barefoot DPM

## 2016-09-15 ENCOUNTER — Other Ambulatory Visit: Payer: Self-pay | Admitting: Internal Medicine

## 2016-09-15 DIAGNOSIS — I10 Essential (primary) hypertension: Secondary | ICD-10-CM | POA: Diagnosis not present

## 2016-09-15 DIAGNOSIS — E119 Type 2 diabetes mellitus without complications: Secondary | ICD-10-CM | POA: Diagnosis not present

## 2016-09-15 DIAGNOSIS — R740 Nonspecific elevation of levels of transaminase and lactic acid dehydrogenase [LDH]: Secondary | ICD-10-CM | POA: Diagnosis not present

## 2016-09-15 DIAGNOSIS — Z0000 Encounter for general adult medical examination without abnormal findings: Secondary | ICD-10-CM | POA: Diagnosis not present

## 2016-09-15 DIAGNOSIS — E559 Vitamin D deficiency, unspecified: Secondary | ICD-10-CM | POA: Diagnosis not present

## 2016-09-15 DIAGNOSIS — F71 Moderate intellectual disabilities: Secondary | ICD-10-CM | POA: Diagnosis not present

## 2016-09-15 DIAGNOSIS — R7401 Elevation of levels of liver transaminase levels: Secondary | ICD-10-CM | POA: Insufficient documentation

## 2016-09-15 DIAGNOSIS — E668 Other obesity: Secondary | ICD-10-CM | POA: Diagnosis not present

## 2016-09-15 DIAGNOSIS — K219 Gastro-esophageal reflux disease without esophagitis: Secondary | ICD-10-CM | POA: Diagnosis not present

## 2016-09-15 DIAGNOSIS — R7402 Elevation of levels of lactic acid dehydrogenase (LDH): Secondary | ICD-10-CM | POA: Insufficient documentation

## 2016-09-15 DIAGNOSIS — E784 Other hyperlipidemia: Secondary | ICD-10-CM | POA: Diagnosis not present

## 2016-09-15 DIAGNOSIS — M25571 Pain in right ankle and joints of right foot: Secondary | ICD-10-CM | POA: Diagnosis not present

## 2016-09-16 DIAGNOSIS — F71 Moderate intellectual disabilities: Secondary | ICD-10-CM | POA: Diagnosis not present

## 2016-09-17 DIAGNOSIS — F71 Moderate intellectual disabilities: Secondary | ICD-10-CM | POA: Diagnosis not present

## 2016-09-18 DIAGNOSIS — F71 Moderate intellectual disabilities: Secondary | ICD-10-CM | POA: Diagnosis not present

## 2016-09-19 DIAGNOSIS — F71 Moderate intellectual disabilities: Secondary | ICD-10-CM | POA: Diagnosis not present

## 2016-09-19 DIAGNOSIS — R740 Nonspecific elevation of levels of transaminase and lactic acid dehydrogenase [LDH]: Secondary | ICD-10-CM | POA: Diagnosis not present

## 2016-09-20 DIAGNOSIS — F71 Moderate intellectual disabilities: Secondary | ICD-10-CM | POA: Diagnosis not present

## 2016-09-21 DIAGNOSIS — F71 Moderate intellectual disabilities: Secondary | ICD-10-CM | POA: Diagnosis not present

## 2016-09-22 ENCOUNTER — Other Ambulatory Visit: Payer: Commercial Managed Care - HMO

## 2016-09-22 DIAGNOSIS — F71 Moderate intellectual disabilities: Secondary | ICD-10-CM | POA: Diagnosis not present

## 2016-09-23 DIAGNOSIS — F71 Moderate intellectual disabilities: Secondary | ICD-10-CM | POA: Diagnosis not present

## 2016-09-24 DIAGNOSIS — F71 Moderate intellectual disabilities: Secondary | ICD-10-CM | POA: Diagnosis not present

## 2016-09-25 DIAGNOSIS — F71 Moderate intellectual disabilities: Secondary | ICD-10-CM | POA: Diagnosis not present

## 2016-09-26 DIAGNOSIS — F71 Moderate intellectual disabilities: Secondary | ICD-10-CM | POA: Diagnosis not present

## 2016-09-27 DIAGNOSIS — F71 Moderate intellectual disabilities: Secondary | ICD-10-CM | POA: Diagnosis not present

## 2016-09-28 ENCOUNTER — Ambulatory Visit
Admission: RE | Admit: 2016-09-28 | Discharge: 2016-09-28 | Disposition: A | Discharge status: Home / Self Care | Payer: Medicare HMO | Source: Ambulatory Visit | Attending: Internal Medicine | Admitting: Internal Medicine

## 2016-09-28 DIAGNOSIS — R740 Nonspecific elevation of levels of transaminase and lactic acid dehydrogenase [LDH]: Principal | ICD-10-CM

## 2016-09-28 DIAGNOSIS — R945 Abnormal results of liver function studies: Secondary | ICD-10-CM | POA: Diagnosis not present

## 2016-09-28 DIAGNOSIS — R7401 Elevation of levels of liver transaminase levels: Secondary | ICD-10-CM

## 2016-09-28 DIAGNOSIS — F71 Moderate intellectual disabilities: Secondary | ICD-10-CM | POA: Diagnosis not present

## 2016-09-29 DIAGNOSIS — F71 Moderate intellectual disabilities: Secondary | ICD-10-CM | POA: Diagnosis not present

## 2016-09-30 DIAGNOSIS — F71 Moderate intellectual disabilities: Secondary | ICD-10-CM | POA: Diagnosis not present

## 2016-10-01 DIAGNOSIS — F71 Moderate intellectual disabilities: Secondary | ICD-10-CM | POA: Diagnosis not present

## 2016-10-02 DIAGNOSIS — F71 Moderate intellectual disabilities: Secondary | ICD-10-CM | POA: Diagnosis not present

## 2016-10-03 DIAGNOSIS — F71 Moderate intellectual disabilities: Secondary | ICD-10-CM | POA: Diagnosis not present

## 2016-10-04 DIAGNOSIS — F71 Moderate intellectual disabilities: Secondary | ICD-10-CM | POA: Diagnosis not present

## 2016-10-05 DIAGNOSIS — F71 Moderate intellectual disabilities: Secondary | ICD-10-CM | POA: Diagnosis not present

## 2016-10-06 DIAGNOSIS — F71 Moderate intellectual disabilities: Secondary | ICD-10-CM | POA: Diagnosis not present

## 2016-10-07 DIAGNOSIS — F71 Moderate intellectual disabilities: Secondary | ICD-10-CM | POA: Diagnosis not present

## 2016-10-08 ENCOUNTER — Emergency Department (HOSPITAL_COMMUNITY): Payer: Medicare HMO

## 2016-10-08 ENCOUNTER — Emergency Department (HOSPITAL_COMMUNITY)
Admission: EM | Admit: 2016-10-08 | Discharge: 2016-10-08 | Disposition: A | Discharge status: Home / Self Care | Payer: Medicare HMO | Attending: Emergency Medicine | Admitting: Emergency Medicine

## 2016-10-08 ENCOUNTER — Encounter (HOSPITAL_COMMUNITY): Payer: Self-pay | Admitting: Emergency Medicine

## 2016-10-08 DIAGNOSIS — K5289 Other specified noninfective gastroenteritis and colitis: Secondary | ICD-10-CM | POA: Insufficient documentation

## 2016-10-08 DIAGNOSIS — R55 Syncope and collapse: Secondary | ICD-10-CM | POA: Diagnosis not present

## 2016-10-08 DIAGNOSIS — R197 Diarrhea, unspecified: Secondary | ICD-10-CM | POA: Diagnosis not present

## 2016-10-08 DIAGNOSIS — E119 Type 2 diabetes mellitus without complications: Secondary | ICD-10-CM | POA: Diagnosis not present

## 2016-10-08 DIAGNOSIS — Y929 Unspecified place or not applicable: Secondary | ICD-10-CM | POA: Insufficient documentation

## 2016-10-08 DIAGNOSIS — R1110 Vomiting, unspecified: Secondary | ICD-10-CM | POA: Diagnosis not present

## 2016-10-08 DIAGNOSIS — F71 Moderate intellectual disabilities: Secondary | ICD-10-CM | POA: Diagnosis not present

## 2016-10-08 DIAGNOSIS — I10 Essential (primary) hypertension: Secondary | ICD-10-CM | POA: Insufficient documentation

## 2016-10-08 DIAGNOSIS — W1830XA Fall on same level, unspecified, initial encounter: Secondary | ICD-10-CM | POA: Diagnosis not present

## 2016-10-08 DIAGNOSIS — S0081XA Abrasion of other part of head, initial encounter: Secondary | ICD-10-CM | POA: Insufficient documentation

## 2016-10-08 DIAGNOSIS — Y999 Unspecified external cause status: Secondary | ICD-10-CM | POA: Diagnosis not present

## 2016-10-08 DIAGNOSIS — R404 Transient alteration of awareness: Secondary | ICD-10-CM | POA: Diagnosis not present

## 2016-10-08 DIAGNOSIS — S0990XA Unspecified injury of head, initial encounter: Secondary | ICD-10-CM | POA: Diagnosis not present

## 2016-10-08 DIAGNOSIS — Z87891 Personal history of nicotine dependence: Secondary | ICD-10-CM | POA: Insufficient documentation

## 2016-10-08 DIAGNOSIS — T07XXXA Unspecified multiple injuries, initial encounter: Secondary | ICD-10-CM

## 2016-10-08 DIAGNOSIS — S0031XA Abrasion of nose, initial encounter: Secondary | ICD-10-CM | POA: Diagnosis not present

## 2016-10-08 DIAGNOSIS — Y939 Activity, unspecified: Secondary | ICD-10-CM | POA: Insufficient documentation

## 2016-10-08 DIAGNOSIS — Z7982 Long term (current) use of aspirin: Secondary | ICD-10-CM | POA: Insufficient documentation

## 2016-10-08 DIAGNOSIS — K529 Noninfective gastroenteritis and colitis, unspecified: Secondary | ICD-10-CM

## 2016-10-08 LAB — CBC
HCT: 49.6 % (ref 39.0–52.0)
Hemoglobin: 16.8 g/dL (ref 13.0–17.0)
MCH: 29.8 pg (ref 26.0–34.0)
MCHC: 33.9 g/dL (ref 30.0–36.0)
MCV: 87.9 fL (ref 78.0–100.0)
PLATELETS: 281 10*3/uL (ref 150–400)
RBC: 5.64 MIL/uL (ref 4.22–5.81)
RDW: 13.8 % (ref 11.5–15.5)
WBC: 23.8 10*3/uL — ABNORMAL HIGH (ref 4.0–10.5)

## 2016-10-08 LAB — BASIC METABOLIC PANEL
Anion gap: 14 (ref 5–15)
BUN: 15 mg/dL (ref 6–20)
CALCIUM: 9.2 mg/dL (ref 8.9–10.3)
CO2: 18 mmol/L — AB (ref 22–32)
CREATININE: 0.99 mg/dL (ref 0.61–1.24)
Chloride: 106 mmol/L (ref 101–111)
GFR calc non Af Amer: >60 mL/min (ref >60)
Glucose, Bld: 154 mg/dL — ABNORMAL HIGH (ref 65–99)
Potassium: 3.9 mmol/L (ref 3.5–5.1)
SODIUM: 138 mmol/L (ref 135–145)

## 2016-10-08 LAB — CBG MONITORING, ED: GLUCOSE-CAPILLARY: 161 mg/dL — AB (ref 65–99)

## 2016-10-08 LAB — I-STAT TROPONIN, ED: TROPONIN I, POC: 0.01 ng/mL (ref 0.00–0.08)

## 2016-10-08 MED ORDER — TETANUS-DIPHTH-ACELL PERTUSSIS 5-2.5-18.5 LF-MCG/0.5 IM SUSP: 0.5000 mL | Freq: Once | INTRAMUSCULAR | Status: DC

## 2016-10-08 MED ORDER — IOPAMIDOL (ISOVUE-300) INJECTION 61%
INTRAVENOUS | Status: AC
  Administered 2016-10-08: 100 mL via INTRAVENOUS
  Filled 2016-10-08: qty 100

## 2016-10-08 MED ORDER — SODIUM CHLORIDE 0.9 % IV BOLUS (SEPSIS)
1000.0000 mL | Freq: Once | INTRAVENOUS | Status: AC
  Administered 2016-10-08: 1000 mL via INTRAVENOUS

## 2016-10-08 NOTE — ED Triage Notes (Signed)
Pt arrived from his group home via EMS after he had an episode of diarrhea, 3 episodes of vomiting, and 1 syncopal episode. Pt hit his head when he passed out and present with abbrassions to his face. Per ems pt was found to be hypotensive and diaphoretic upon arrival. Pt given 438mL NS and 4mg  Zofran PTA.

## 2016-10-08 NOTE — Discharge Instructions (Signed)
You have been diagnosed with having enteritis (viral stomach flu).  Please stay hydrated, eat bland food and follow up with your doctor for further care.  Apply neosporin to skin abrasions daily to decrease risk of infection. Take over the counter tylenol or ibuprofen as needed for pain.

## 2016-10-08 NOTE — ED Notes (Signed)
CBG 161 

## 2016-10-08 NOTE — ED Provider Notes (Signed)
[facility] DEPT Provider Note   CSN: [id number] Arrival date & time: 10/08/16  2059     History   Chief Complaint Chief Complaint  Patient presents with  . Loss of Consciousness  . Nausea  . Emesis    HPI Arthur Weaver is a 56 y.o. male.  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 After having spent a for dinner for tonight, he had 3 episodes of diarrhea. Cannot tell me if there is any mucus or blood in it. He went to the office to get some Imodiumand while the office, patient proceeds to vomit profusely, follows with a syncopal episode when he fell forward striking face against the ground. Incident was witnessed by staff. Patient became diaphoretic afterward but now felt better. He suffered several abrasions to his face. When asked if he is in any pain, patient denies. Denies having headache, facial pain, neck pain, chest pain, trouble breathing, productive cough, abdominal pain,  Or dysuria. No recent medication changes no recent travel. No recent sick contact. Family history of factor 5 deficiency. Patient never had any history of blood clots in the past.no recent antibiotic use.no history of seizure  Past Medical History:  Diagnosis Date  . At risk for sleep apnea    STOP-BANG= 5       SENT TO PCP 04-14-2016  . BPH (benign prostatic hyperplasia)   . DDD (degenerative disc disease), cervical   . DDD (degenerative disc disease), lumbar   . Diverticulosis of colon   . Family history of factor V deficiency    per pt's sister (whom is pt's guardian) she, pt's brother and parents have factor V but pt has never been tested  . Frequency of urination   . GERD (gastroesophageal reflux disease)   . History of adenomatous polyp of colon    2006 and 03-18-2010  tubular adenoma  . Hyperlipidemia   . Hypertension   . Lives in independent group home resides at [facility] in [place], [place]   pt independant w/ ADLs w/ exception does not cook for shop   . Mentally challenged    SPECIAL NEEDS  . OA (osteoarthritis)    hands, knees, feet  . Pre-diabetes   . Thoracic spondylosis   . Urgency of urination   . Wears partial dentures    lower    Patient Active Problem List   Diagnosis Date Noted  . 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 Tendonitis 09/25/2015  . High blood pressure 09/07/2010  . High cholesterol 09/07/2010  . Bilateral swelling of feet 09/07/2010  . Diabetes mellitus 09/07/2010  . Rheumatoid arthritis(714.0) 09/07/2010  . Personal history of colonic polyps 10/27/2004    Past Surgical History:  Procedure Laterality Date  . CATARACT EXTRACTION W/ INTRAOCULAR LENS  IMPLANT, BILATERAL  2006  . CIRCUMCISION  03/29/2007   W/  CYSTOSCOPY AND TRANSRECTAL ULTRASOUND GUIDED PROSTATE BX'S  . COLONOSCOPY  last one 08-26-2015  . THULIUM LASER TURP (TRANSURETHRAL RESECTION OF PROSTATE) N/A 04/21/2016   Procedure: THULIUM LASER TURP (TRANSURETHRAL RESECTION OF PROSTATE);  Surgeon: Carolan Clines, MD;  Location: [facility];  Service: Urology;  Laterality: N/A;       Home Medications    Prior to Admission medications   Medication Sig Start Date End Date Taking?  Authorizing Provider  aspirin 81 MG tablet Take 81 mg by mouth daily.      [provider]  atorvastatin (LIPITOR) 20 MG tablet Take 20 mg by mouth every evening.  05/09/14   [provider]  Cholecalciferol (VITAMIN D-3) 1000 units CAPS Take 1 capsule by mouth every morning.     [provider]  diclofenac sodium (VOLTAREN) 1 % GEL Voltaren Gel 3 grams to 3 large joints upto TID 3 TUBES with 3 refills 09/14/16   Panwala, Naitik, PA-C  finasteride (PROSCAR) 5 MG tablet Take 5 mg by mouth every evening. Reported on 08/26/2015  08/25/15   [provider]  Hyoscyamine Sulfate SL (LEVSIN/SL) 0.125 MG SUBL Place 0.125 mg under the tongue 2 (two) times daily as needed. For bladder spasm. May cause dry mouth. 04/21/16   Carolan Clines, MD  Ibuprofen 200 MG CAPS Take 1 capsule (200 mg total) by mouth 3 (three) times daily. Patient taking differently: Take 200 mg by mouth 3 (three) times daily as needed.  12/02/15   Trula Slade, DPM  lisinopril (PRINIVIL,ZESTRIL) 40 MG tablet Take 40 mg by mouth every evening.  05/09/14   [provider]  Multiple Vitamin (MULTIVITAMIN PO) Take 1 tablet by mouth every evening.     [provider]  nabumetone (RELAFEN) 500 MG tablet Take 500 mg by mouth 2 (two) times daily as needed.    [provider]  omeprazole (PRILOSEC) 40 MG capsule Take 40 mg by mouth every morning.  04/23/13   [provider]  phenazopyridine (PYRIDIUM) 200 MG tablet Take 1 tablet (200 mg total) by mouth 3 (three) times daily as needed for pain. 04/21/16   Carolan Clines, MD  tamsulosin (FLOMAX) 0.4 MG CAPS capsule Take 0.4 mg by mouth every evening.  05/09/14   [provider]  traMADol-acetaminophen (ULTRACET) 37.5-325 MG tablet Take 1 tablet by mouth every 6 (six) hours as needed. 04/21/16   Carolan Clines, MD  trimethoprim (TRIMPEX) 100 MG tablet Take 1 tablet (100 mg total) by mouth daily. 04/21/16   Carolan Clines, MD  VOLTAREN 1 % GEL Apply topically 2 (two) times daily. Arch of feet 03/13/13   [provider]    Family History Family History  Problem Relation Age of Onset  . Colon cancer Neg Hx     Social History Social History  Substance Use Topics  . Smoking status: Never Smoker  . Smokeless tobacco: Former Systems developer    Types: [place] date: 04/14/1992  . Alcohol use No     Allergies   Penicillins; Pork-derived products; and Sulfa antibiotics   Review of Systems Review of Systems  All other systems reviewed and are  negative.    Physical Exam Updated Vital Signs Ht 5\' 10"  (1.778 m)   Wt 108.4 kg (239 lb)   SpO2 98%   BMI 34.29 kg/m   Physical Exam  Constitutional: He appears well-developed and well-nourished. No distress.  Obese male laying in bed in no acute discomfort  HENT:  Abrasion noted to mid forehead with hematoma to mid forehead, abrasion to the bridge of nose. No hemotympanum, no septal hematoma, no malocclusion, no midface tenderness, no nosebleed  Eyes: Pupils are equal, round, and reactive to light. Conjunctivae and EOM are normal.  Neck: Normal range of motion. Neck supple.  No cervical midline spine tenderness crepitus or step-off  Cardiovascular: Normal rate and regular rhythm.   Pulmonary/Chest: Effort normal and breath sounds normal.  Abdominal: Soft. He exhibits no distension. There is no tenderness.  Musculoskeletal: He exhibits no tenderness.  Moving all 4 extremities with equal strength  Neurological: He is alert.  Patient is alert and oriented.  Skin: No rash noted.  Psychiatric: He has a normal mood and affect.  Nursing note and vitals reviewed.    ED Treatments / Results  Labs (all labs ordered are listed, but only abnormal results are displayed) Labs Reviewed  BASIC METABOLIC PANEL - Abnormal; Notable for the following:       Result Value   CO2 18 (*)    Glucose, Bld 154 (*)    All other components within normal limits  CBC - Abnormal; Notable for the following:    WBC 23.8 (*)    All other components within normal limits  CBG MONITORING, ED - Abnormal; Notable for the following:    Glucose-Capillary 161 (*)    All other components within normal limits  URINALYSIS, ROUTINE W REFLEX MICROSCOPIC  I-STAT TROPONIN, ED    EKG  EKG Interpretation  Date/Time:  Saturday October 08 2016 21:02:49 EDT Ventricular Rate:  88 PR Interval:    QRS Duration: 93 QT Interval:  387 QTC Calculation: 469 R Axis:   55 Text Interpretation:  Sinus rhythm Consider  left atrial enlargement Abnormal R-wave progression, early transition no wpw, prolonged qt, or brugada No significant change since last tracing Confirmed by Deno Etienne [phone]) on 10/08/2016 9:07:04 PM       Radiology Ct Head Wo Contrast  Result Date: 10/08/2016 CLINICAL DATA:  Syncopal episode, striking his head in a ground level fall. EXAM: CT HEAD WITHOUT CONTRAST CT MAXILLOFACIAL WITHOUT CONTRAST TECHNIQUE: Multidetector CT imaging of the head and maxillofacial structures were performed using the standard protocol without intravenous contrast. Multiplanar CT image reconstructions of the maxillofacial structures were also generated. COMPARISON:  None. FINDINGS: CT HEAD FINDINGS Brain: There is no intracranial hemorrhage, mass or evidence of acute infarction. There is no extra-axial fluid collection. Gray matter and white matter appear normal. Cerebral volume is normal for age. Brainstem and posterior fossa are unremarkable. The CSF spaces appear normal. Vascular: No hyperdense vessel or unexpected calcification. Skull: Normal. Negative for fracture or focal lesion. Other: Frontal scalp swelling/hematoma. CT MAXILLOFACIAL FINDINGS Osseous: No fracture or mandibular dislocation. No destructive process. Orbits: Negative. No traumatic or inflammatory finding. Sinuses: Clear. Soft tissues: Negative. IMPRESSION: 1. Normal brain. 2. Negative for acute maxillofacial fracture. Frontal scalp swelling. Electronically Signed   By: Andreas Newport M.D.   On: 10/08/2016 22:37   Ct Abdomen Pelvis W Contrast  Result Date: 10/08/2016 CLINICAL DATA:  Diarrhea and vomiting and syncope tonight P EXAM: CT ABDOMEN AND PELVIS WITH CONTRAST TECHNIQUE: Multidetector CT imaging of the abdomen and pelvis was performed using the standard protocol following bolus administration of intravenous contrast. CONTRAST:  129mL ISOVUE-300 IOPAMIDOL (ISOVUE-300) INJECTION 61% COMPARISON:  Ultrasound 09/28/2016 FINDINGS: Lower chest: No acute  abnormality. Hepatobiliary: No focal liver abnormality is seen. No gallstones, gallbladder wall thickening, or biliary dilatation. Pancreas: Unremarkable. No pancreatic ductal dilatation or surrounding inflammatory changes. Spleen: Normal in size without focal abnormality. Adrenals/Urinary Tract: Adrenal glands are unremarkable. Kidneys are normal, without renal calculi, focal lesion, or hydronephrosis. Bladder is unremarkable. Stomach/Bowel: Stomach is normal. Small bowel contains a generous volume of fluid, but no evidence of obstruction. No focal bowel inflammation. Colon is remarkable only for uncomplicated mild diverticulosis. Appendix is normal. Vascular/Lymphatic: No significant vascular findings are present. No enlarged abdominal or pelvic lymph  nodes. Reproductive: Unremarkable Other: No acute inflammation.  No ascites. Musculoskeletal: No significant skeletal lesion. Moderately severe lumbar degenerative disc and facet changes from L3 through the sacrum. IMPRESSION: 1. Generous small bowel intraluminal fluid without obstruction or focal inflammation. This could represent enteritis. 2. Uncomplicated mild colonic diverticulosis. Electronically Signed   By: Andreas Newport M.D.   On: 10/08/2016 22:56   Ct Maxillofacial Wo Contrast  Result Date: 10/08/2016 CLINICAL DATA:  Syncopal episode, striking his head in a ground level fall. EXAM: CT HEAD WITHOUT CONTRAST CT MAXILLOFACIAL WITHOUT CONTRAST TECHNIQUE: Multidetector CT imaging of the head and maxillofacial structures were performed using the standard protocol without intravenous contrast. Multiplanar CT image reconstructions of the maxillofacial structures were also generated. COMPARISON:  None. FINDINGS: CT HEAD FINDINGS Brain: There is no intracranial hemorrhage, mass or evidence of acute infarction. There is no extra-axial fluid collection. Gray matter and white matter appear normal. Cerebral volume is normal for age. Brainstem and posterior fossa  are unremarkable. The CSF spaces appear normal. Vascular: No hyperdense vessel or unexpected calcification. Skull: Normal. Negative for fracture or focal lesion. Other: Frontal scalp swelling/hematoma. CT MAXILLOFACIAL FINDINGS Osseous: No fracture or mandibular dislocation. No destructive process. Orbits: Negative. No traumatic or inflammatory finding. Sinuses: Clear. Soft tissues: Negative. IMPRESSION: 1. Normal brain. 2. Negative for acute maxillofacial fracture. Frontal scalp swelling. Electronically Signed   By: Andreas Newport M.D.   On: 10/08/2016 22:37    Procedures Procedures (including critical care time)  Medications Ordered in ED Medications  Tdap (BOOSTRIX) injection 0.5 mL (0.5 mLs Intramuscular Refused 10/08/16 2147)  sodium chloride 0.9 % bolus 1,000 mL (1,000 mLs Intravenous New Bag/Given 10/08/16 2143)  iopamidol (ISOVUE-300) 61 % injection (100 mLs Intravenous Contrast Given 10/08/16 2208)     Initial Impression / Assessment and Plan / ED Course  I have reviewed the triage vital signs and the nursing notes.  Pertinent labs & imaging results that were available during my care of the patient were reviewed by me and considered in my medical decision making (see chart for details).     BP (!) 153/81   Pulse 78   Temp 97.6 F (36.4 C) (Oral)   Resp 18   Ht 5\' 10"  (1.778 m)   Wt 108.4 kg (239 lb)   SpO2 96%   BMI 34.29 kg/m    Final Clinical Impressions(s) / ED Diagnoses   Final diagnoses:  Enteritis  Abrasions of multiple sites  Syncope and collapse    New Prescriptions New Prescriptions   No medications on file   9:35 PM Patient here for a witnessed syncopal episode after he had vomiting and diarrhea today after dinner. History of MR, therefore history is limited. He is currently in no acute discomfort, vital signs stable, afebrile. Workup initiated.  Care discussed with Dr. Tyrone Nine.  11:04 PM Patient fell injuring his face.  He has several small skin  abrasions to forehead and bridge of nose.  CT scan of head and maxillofacial without concerning finding.  He has had n/v/d today.  WBC of 23.8.  Abd/pelvis CT demonstrates finding suggestive of enteritis.  This is consistent with pt's sxs.  He is now better after receiving IVF.  No active vomiting in the ER.  EKG without concerning changes, normal orthostatic vital sign.  Not uptodate with tdap, tdap offered but pt refused.  Normal orthostatic vital sign.    Pt's sxs likely viral enteritis.  Recommend staying well hydrated.  Abrasion care discussed.  F/u with  pcp for further care.  Return precaution given.     Domenic Moras, PA-C 10/08/16 [place], [place], DO 10/08/16 2327

## 2016-10-09 DIAGNOSIS — F71 Moderate intellectual disabilities: Secondary | ICD-10-CM | POA: Diagnosis not present

## 2016-10-10 DIAGNOSIS — F71 Moderate intellectual disabilities: Secondary | ICD-10-CM | POA: Diagnosis not present

## 2016-10-11 DIAGNOSIS — F71 Moderate intellectual disabilities: Secondary | ICD-10-CM | POA: Diagnosis not present

## 2016-10-12 DIAGNOSIS — F71 Moderate intellectual disabilities: Secondary | ICD-10-CM | POA: Diagnosis not present

## 2016-10-13 DIAGNOSIS — F71 Moderate intellectual disabilities: Secondary | ICD-10-CM | POA: Diagnosis not present

## 2016-10-14 DIAGNOSIS — Z6835 Body mass index (BMI) 35.0-35.9, adult: Secondary | ICD-10-CM | POA: Diagnosis not present

## 2016-10-14 DIAGNOSIS — F71 Moderate intellectual disabilities: Secondary | ICD-10-CM | POA: Diagnosis not present

## 2016-10-14 DIAGNOSIS — R740 Nonspecific elevation of levels of transaminase and lactic acid dehydrogenase [LDH]: Secondary | ICD-10-CM | POA: Diagnosis not present

## 2016-10-14 DIAGNOSIS — Z23 Encounter for immunization: Secondary | ICD-10-CM | POA: Diagnosis not present

## 2016-10-14 DIAGNOSIS — R55 Syncope and collapse: Secondary | ICD-10-CM | POA: Diagnosis not present

## 2016-10-14 DIAGNOSIS — K529 Noninfective gastroenteritis and colitis, unspecified: Secondary | ICD-10-CM | POA: Diagnosis not present

## 2016-10-15 DIAGNOSIS — F71 Moderate intellectual disabilities: Secondary | ICD-10-CM | POA: Diagnosis not present

## 2016-10-16 DIAGNOSIS — F71 Moderate intellectual disabilities: Secondary | ICD-10-CM | POA: Diagnosis not present

## 2016-10-17 DIAGNOSIS — F71 Moderate intellectual disabilities: Secondary | ICD-10-CM | POA: Diagnosis not present

## 2016-10-18 DIAGNOSIS — F71 Moderate intellectual disabilities: Secondary | ICD-10-CM | POA: Diagnosis not present

## 2016-10-23 DIAGNOSIS — F71 Moderate intellectual disabilities: Secondary | ICD-10-CM | POA: Diagnosis not present

## 2016-10-24 DIAGNOSIS — F71 Moderate intellectual disabilities: Secondary | ICD-10-CM | POA: Diagnosis not present

## 2016-10-25 DIAGNOSIS — F71 Moderate intellectual disabilities: Secondary | ICD-10-CM | POA: Diagnosis not present

## 2016-10-26 DIAGNOSIS — F71 Moderate intellectual disabilities: Secondary | ICD-10-CM | POA: Diagnosis not present

## 2016-10-27 DIAGNOSIS — F71 Moderate intellectual disabilities: Secondary | ICD-10-CM | POA: Diagnosis not present

## 2016-10-28 DIAGNOSIS — F71 Moderate intellectual disabilities: Secondary | ICD-10-CM | POA: Diagnosis not present

## 2016-10-29 DIAGNOSIS — F71 Moderate intellectual disabilities: Secondary | ICD-10-CM | POA: Diagnosis not present

## 2016-10-30 DIAGNOSIS — F71 Moderate intellectual disabilities: Secondary | ICD-10-CM | POA: Diagnosis not present

## 2016-10-31 DIAGNOSIS — F71 Moderate intellectual disabilities: Secondary | ICD-10-CM | POA: Diagnosis not present

## 2016-11-01 DIAGNOSIS — F71 Moderate intellectual disabilities: Secondary | ICD-10-CM | POA: Diagnosis not present

## 2016-11-02 DIAGNOSIS — F71 Moderate intellectual disabilities: Secondary | ICD-10-CM | POA: Diagnosis not present

## 2016-11-03 DIAGNOSIS — F71 Moderate intellectual disabilities: Secondary | ICD-10-CM | POA: Diagnosis not present

## 2016-11-04 DIAGNOSIS — F71 Moderate intellectual disabilities: Secondary | ICD-10-CM | POA: Diagnosis not present

## 2016-11-05 DIAGNOSIS — F71 Moderate intellectual disabilities: Secondary | ICD-10-CM | POA: Diagnosis not present

## 2016-11-06 DIAGNOSIS — F71 Moderate intellectual disabilities: Secondary | ICD-10-CM | POA: Diagnosis not present

## 2016-11-07 DIAGNOSIS — F71 Moderate intellectual disabilities: Secondary | ICD-10-CM | POA: Diagnosis not present

## 2016-11-08 DIAGNOSIS — F71 Moderate intellectual disabilities: Secondary | ICD-10-CM | POA: Diagnosis not present

## 2016-11-09 DIAGNOSIS — F71 Moderate intellectual disabilities: Secondary | ICD-10-CM | POA: Diagnosis not present

## 2016-11-10 DIAGNOSIS — F71 Moderate intellectual disabilities: Secondary | ICD-10-CM | POA: Diagnosis not present

## 2016-11-11 DIAGNOSIS — F71 Moderate intellectual disabilities: Secondary | ICD-10-CM | POA: Diagnosis not present

## 2016-11-12 DIAGNOSIS — F71 Moderate intellectual disabilities: Secondary | ICD-10-CM | POA: Diagnosis not present

## 2016-11-13 DIAGNOSIS — F71 Moderate intellectual disabilities: Secondary | ICD-10-CM | POA: Diagnosis not present

## 2016-11-14 DIAGNOSIS — F71 Moderate intellectual disabilities: Secondary | ICD-10-CM | POA: Diagnosis not present

## 2016-11-15 DIAGNOSIS — F71 Moderate intellectual disabilities: Secondary | ICD-10-CM | POA: Diagnosis not present

## 2016-11-16 DIAGNOSIS — F71 Moderate intellectual disabilities: Secondary | ICD-10-CM | POA: Diagnosis not present

## 2016-11-17 DIAGNOSIS — F71 Moderate intellectual disabilities: Secondary | ICD-10-CM | POA: Diagnosis not present

## 2016-11-18 DIAGNOSIS — F71 Moderate intellectual disabilities: Secondary | ICD-10-CM | POA: Diagnosis not present

## 2016-11-19 DIAGNOSIS — F71 Moderate intellectual disabilities: Secondary | ICD-10-CM | POA: Diagnosis not present

## 2016-11-20 DIAGNOSIS — F71 Moderate intellectual disabilities: Secondary | ICD-10-CM | POA: Diagnosis not present

## 2016-11-21 DIAGNOSIS — F71 Moderate intellectual disabilities: Secondary | ICD-10-CM | POA: Diagnosis not present

## 2016-11-22 DIAGNOSIS — F71 Moderate intellectual disabilities: Secondary | ICD-10-CM | POA: Diagnosis not present

## 2016-11-23 ENCOUNTER — Encounter: Payer: Self-pay | Admitting: Podiatry

## 2016-11-23 ENCOUNTER — Ambulatory Visit (INDEPENDENT_AMBULATORY_CARE_PROVIDER_SITE_OTHER): Payer: Medicare HMO | Admitting: Podiatry

## 2016-11-23 DIAGNOSIS — Z23 Encounter for immunization: Secondary | ICD-10-CM | POA: Diagnosis not present

## 2016-11-23 DIAGNOSIS — E0851 Diabetes mellitus due to underlying condition with diabetic peripheral angiopathy without gangrene: Secondary | ICD-10-CM | POA: Diagnosis not present

## 2016-11-23 DIAGNOSIS — M19071 Primary osteoarthritis, right ankle and foot: Secondary | ICD-10-CM

## 2016-11-23 DIAGNOSIS — F71 Moderate intellectual disabilities: Secondary | ICD-10-CM | POA: Diagnosis not present

## 2016-11-23 DIAGNOSIS — M79676 Pain in unspecified toe(s): Secondary | ICD-10-CM | POA: Diagnosis not present

## 2016-11-23 DIAGNOSIS — L84 Corns and callosities: Secondary | ICD-10-CM | POA: Diagnosis not present

## 2016-11-23 DIAGNOSIS — B351 Tinea unguium: Secondary | ICD-10-CM

## 2016-11-23 DIAGNOSIS — Q828 Other specified congenital malformations of skin: Secondary | ICD-10-CM

## 2016-11-23 NOTE — Progress Notes (Signed)
Subjective:     Patient ID: ARJUNA DOEDEN, male   DOB: 1960-06-16, 56 y.o.   MRN: [id number]  HPIThis patient presents to the office with painful long thick nails.  His nails are painful walking and wearing his shoes.  .  He also remarks that his right foot is flat when he walks and wears his shoes. He has painful callus both feet. Patient says he is wearing his diabetic shoes and [employer].  He presents for preventative foot care services. He has developed red inflamed skin on lateral aspect right foot.    Review of Systems     Objective:   Physical Exam GENERAL APPEARANCE: Alert, conversant. Appropriately groomed. No acute distress.  VASCULAR: Pedal pulses absent  DP and PT bilateral.  Capillary refill time is immediate to all digits,   NEUROLOGIC: sensation is diminished  epicritically and protectively to 5.07 monofilament at 5/5 sites bilateral.  Light touch is intact bilateral.  MUSCULOSKELETAL: acceptable muscle strength, tone and stability bilateral.  Intrinsic muscluature intact bilateral.  Rectus appearance of foot and digits noted bilateral. Medial bulge noted at TNJ right foot. HAV B/L.  Hammer toes 2-4 B/L.  Patient has developed enlarged bone at TNJ right foot.  Swelling noted right ankle.  DERMATOLOGIC: skin color, texture, and turgor are within normal limits.  No preulcerative lesions or ulcers  are seen, no interdigital maceration noted.  No open lesions present.. No drainage noted.  Pinch callus B/L.  Callus sub 1st B/L. Distal clavi 2,3 B/L .  Red inflamed blisters right foot lateral ankle. NAILS  Thick disfigured discolored nails both feet.     Assessment:     Onychomycosis    Diabetes Mellitus Ankle Arthropathy.    Plan:     Debridement of Nails.  . Debridement of callus. RTC 3 months for nail care.  Added padding to [facility] to provide interface between skin and brace.  Told to use cortisone cream.   Gardiner Barefoot DPM

## 2016-11-24 DIAGNOSIS — F71 Moderate intellectual disabilities: Secondary | ICD-10-CM | POA: Diagnosis not present

## 2016-11-25 ENCOUNTER — Telehealth: Payer: Self-pay | Admitting: *Deleted

## 2016-11-25 DIAGNOSIS — F71 Moderate intellectual disabilities: Secondary | ICD-10-CM | POA: Diagnosis not present

## 2016-11-25 NOTE — Telephone Encounter (Signed)
[facility] [place] [facility] request clarification of orders and needs a stop date.11/25/2016-Left message informing [place] pt should use the cortisone cream for 2 weeks and if the area is not better make an appt.

## 2016-11-26 DIAGNOSIS — F71 Moderate intellectual disabilities: Secondary | ICD-10-CM | POA: Diagnosis not present

## 2016-11-27 DIAGNOSIS — F71 Moderate intellectual disabilities: Secondary | ICD-10-CM | POA: Diagnosis not present

## 2016-11-28 DIAGNOSIS — F71 Moderate intellectual disabilities: Secondary | ICD-10-CM | POA: Diagnosis not present

## 2016-11-29 DIAGNOSIS — F71 Moderate intellectual disabilities: Secondary | ICD-10-CM | POA: Diagnosis not present

## 2016-11-30 DIAGNOSIS — F71 Moderate intellectual disabilities: Secondary | ICD-10-CM | POA: Diagnosis not present

## 2016-12-01 DIAGNOSIS — F71 Moderate intellectual disabilities: Secondary | ICD-10-CM | POA: Diagnosis not present

## 2016-12-02 DIAGNOSIS — F71 Moderate intellectual disabilities: Secondary | ICD-10-CM | POA: Diagnosis not present

## 2016-12-03 DIAGNOSIS — F71 Moderate intellectual disabilities: Secondary | ICD-10-CM | POA: Diagnosis not present

## 2016-12-04 DIAGNOSIS — F71 Moderate intellectual disabilities: Secondary | ICD-10-CM | POA: Diagnosis not present

## 2016-12-05 DIAGNOSIS — F71 Moderate intellectual disabilities: Secondary | ICD-10-CM | POA: Diagnosis not present

## 2016-12-06 DIAGNOSIS — F71 Moderate intellectual disabilities: Secondary | ICD-10-CM | POA: Diagnosis not present

## 2016-12-07 DIAGNOSIS — F71 Moderate intellectual disabilities: Secondary | ICD-10-CM | POA: Diagnosis not present

## 2016-12-08 DIAGNOSIS — F71 Moderate intellectual disabilities: Secondary | ICD-10-CM | POA: Diagnosis not present

## 2016-12-09 DIAGNOSIS — F71 Moderate intellectual disabilities: Secondary | ICD-10-CM | POA: Diagnosis not present

## 2016-12-10 DIAGNOSIS — F71 Moderate intellectual disabilities: Secondary | ICD-10-CM | POA: Diagnosis not present

## 2016-12-11 DIAGNOSIS — F71 Moderate intellectual disabilities: Secondary | ICD-10-CM | POA: Diagnosis not present

## 2016-12-12 DIAGNOSIS — F71 Moderate intellectual disabilities: Secondary | ICD-10-CM | POA: Diagnosis not present

## 2016-12-13 ENCOUNTER — Ambulatory Visit: Payer: Medicare HMO | Admitting: Orthotics

## 2016-12-13 DIAGNOSIS — M79674 Pain in right toe(s): Secondary | ICD-10-CM

## 2016-12-13 DIAGNOSIS — M19071 Primary osteoarthritis, right ankle and foot: Secondary | ICD-10-CM

## 2016-12-13 DIAGNOSIS — F71 Moderate intellectual disabilities: Secondary | ICD-10-CM | POA: Diagnosis not present

## 2016-12-13 DIAGNOSIS — L84 Corns and callosities: Secondary | ICD-10-CM

## 2016-12-13 DIAGNOSIS — E0851 Diabetes mellitus due to underlying condition with diabetic peripheral angiopathy without gangrene: Secondary | ICD-10-CM

## 2016-12-13 DIAGNOSIS — M79675 Pain in left toe(s): Secondary | ICD-10-CM

## 2016-12-13 NOTE — Progress Notes (Signed)
Patient present pictures of allergic skin reaction on brace side of foot (R); Patient didn't have any blistering, redness on Left foot.   Inside of brace was dirty (rec Oct 2017); I am sending back to [facility] for adjustment to line w/ plastizote.   Since brace is year old, we can charge for [phone] and [phone] once delivered.

## 2016-12-14 DIAGNOSIS — F71 Moderate intellectual disabilities: Secondary | ICD-10-CM | POA: Diagnosis not present

## 2016-12-15 DIAGNOSIS — F71 Moderate intellectual disabilities: Secondary | ICD-10-CM | POA: Diagnosis not present

## 2016-12-16 DIAGNOSIS — F71 Moderate intellectual disabilities: Secondary | ICD-10-CM | POA: Diagnosis not present

## 2016-12-17 DIAGNOSIS — F71 Moderate intellectual disabilities: Secondary | ICD-10-CM | POA: Diagnosis not present

## 2016-12-18 DIAGNOSIS — F71 Moderate intellectual disabilities: Secondary | ICD-10-CM | POA: Diagnosis not present

## 2016-12-19 DIAGNOSIS — F71 Moderate intellectual disabilities: Secondary | ICD-10-CM | POA: Diagnosis not present

## 2016-12-20 DIAGNOSIS — F71 Moderate intellectual disabilities: Secondary | ICD-10-CM | POA: Diagnosis not present

## 2016-12-21 DIAGNOSIS — F71 Moderate intellectual disabilities: Secondary | ICD-10-CM | POA: Diagnosis not present

## 2016-12-22 DIAGNOSIS — F71 Moderate intellectual disabilities: Secondary | ICD-10-CM | POA: Diagnosis not present

## 2016-12-23 DIAGNOSIS — F71 Moderate intellectual disabilities: Secondary | ICD-10-CM | POA: Diagnosis not present

## 2016-12-24 DIAGNOSIS — F71 Moderate intellectual disabilities: Secondary | ICD-10-CM | POA: Diagnosis not present

## 2016-12-25 DIAGNOSIS — F71 Moderate intellectual disabilities: Secondary | ICD-10-CM | POA: Diagnosis not present

## 2016-12-26 DIAGNOSIS — F71 Moderate intellectual disabilities: Secondary | ICD-10-CM | POA: Diagnosis not present

## 2016-12-27 DIAGNOSIS — F71 Moderate intellectual disabilities: Secondary | ICD-10-CM | POA: Diagnosis not present

## 2016-12-28 DIAGNOSIS — F71 Moderate intellectual disabilities: Secondary | ICD-10-CM | POA: Diagnosis not present

## 2016-12-29 DIAGNOSIS — F71 Moderate intellectual disabilities: Secondary | ICD-10-CM | POA: Diagnosis not present

## 2016-12-30 DIAGNOSIS — F71 Moderate intellectual disabilities: Secondary | ICD-10-CM | POA: Diagnosis not present

## 2016-12-31 DIAGNOSIS — F71 Moderate intellectual disabilities: Secondary | ICD-10-CM | POA: Diagnosis not present

## 2017-01-01 DIAGNOSIS — F71 Moderate intellectual disabilities: Secondary | ICD-10-CM | POA: Diagnosis not present

## 2017-01-02 DIAGNOSIS — E119 Type 2 diabetes mellitus without complications: Secondary | ICD-10-CM | POA: Diagnosis not present

## 2017-01-02 DIAGNOSIS — F71 Moderate intellectual disabilities: Secondary | ICD-10-CM | POA: Diagnosis not present

## 2017-01-02 DIAGNOSIS — I10 Essential (primary) hypertension: Secondary | ICD-10-CM | POA: Diagnosis not present

## 2017-01-02 DIAGNOSIS — R21 Rash and other nonspecific skin eruption: Secondary | ICD-10-CM | POA: Diagnosis not present

## 2017-01-02 DIAGNOSIS — E668 Other obesity: Secondary | ICD-10-CM | POA: Diagnosis not present

## 2017-01-02 DIAGNOSIS — Z6832 Body mass index (BMI) 32.0-32.9, adult: Secondary | ICD-10-CM | POA: Diagnosis not present

## 2017-01-03 DIAGNOSIS — F71 Moderate intellectual disabilities: Secondary | ICD-10-CM | POA: Diagnosis not present

## 2017-01-04 DIAGNOSIS — F71 Moderate intellectual disabilities: Secondary | ICD-10-CM | POA: Diagnosis not present

## 2017-01-05 DIAGNOSIS — F71 Moderate intellectual disabilities: Secondary | ICD-10-CM | POA: Diagnosis not present

## 2017-01-06 DIAGNOSIS — F71 Moderate intellectual disabilities: Secondary | ICD-10-CM | POA: Diagnosis not present

## 2017-01-07 DIAGNOSIS — F71 Moderate intellectual disabilities: Secondary | ICD-10-CM | POA: Diagnosis not present

## 2017-01-08 DIAGNOSIS — F71 Moderate intellectual disabilities: Secondary | ICD-10-CM | POA: Diagnosis not present

## 2017-01-09 DIAGNOSIS — F71 Moderate intellectual disabilities: Secondary | ICD-10-CM | POA: Diagnosis not present

## 2017-01-10 DIAGNOSIS — F71 Moderate intellectual disabilities: Secondary | ICD-10-CM | POA: Diagnosis not present

## 2017-01-11 ENCOUNTER — Ambulatory Visit: Payer: Medicare HMO | Admitting: Orthotics

## 2017-01-11 DIAGNOSIS — M79674 Pain in right toe(s): Secondary | ICD-10-CM

## 2017-01-11 DIAGNOSIS — M79675 Pain in left toe(s): Principal | ICD-10-CM

## 2017-01-11 DIAGNOSIS — F71 Moderate intellectual disabilities: Secondary | ICD-10-CM | POA: Diagnosis not present

## 2017-01-11 NOTE — Progress Notes (Signed)
Shoe didn't fit brace.  Order Orhtofeet 510 size 11 2E  Liliane Channel

## 2017-01-12 DIAGNOSIS — F71 Moderate intellectual disabilities: Secondary | ICD-10-CM | POA: Diagnosis not present

## 2017-01-13 DIAGNOSIS — F71 Moderate intellectual disabilities: Secondary | ICD-10-CM | POA: Diagnosis not present

## 2017-01-14 DIAGNOSIS — F71 Moderate intellectual disabilities: Secondary | ICD-10-CM | POA: Diagnosis not present

## 2017-01-15 DIAGNOSIS — F71 Moderate intellectual disabilities: Secondary | ICD-10-CM | POA: Diagnosis not present

## 2017-01-16 DIAGNOSIS — F71 Moderate intellectual disabilities: Secondary | ICD-10-CM | POA: Diagnosis not present

## 2017-01-17 DIAGNOSIS — F71 Moderate intellectual disabilities: Secondary | ICD-10-CM | POA: Diagnosis not present

## 2017-01-18 DIAGNOSIS — F71 Moderate intellectual disabilities: Secondary | ICD-10-CM | POA: Diagnosis not present

## 2017-01-19 DIAGNOSIS — F71 Moderate intellectual disabilities: Secondary | ICD-10-CM | POA: Diagnosis not present

## 2017-01-20 DIAGNOSIS — F71 Moderate intellectual disabilities: Secondary | ICD-10-CM | POA: Diagnosis not present

## 2017-01-21 DIAGNOSIS — F71 Moderate intellectual disabilities: Secondary | ICD-10-CM | POA: Diagnosis not present

## 2017-01-22 DIAGNOSIS — F71 Moderate intellectual disabilities: Secondary | ICD-10-CM | POA: Diagnosis not present

## 2017-01-23 DIAGNOSIS — F71 Moderate intellectual disabilities: Secondary | ICD-10-CM | POA: Diagnosis not present

## 2017-01-24 ENCOUNTER — Ambulatory Visit: Payer: Medicare HMO | Admitting: Orthotics

## 2017-01-24 DIAGNOSIS — F71 Moderate intellectual disabilities: Secondary | ICD-10-CM | POA: Diagnosis not present

## 2017-01-25 DIAGNOSIS — F71 Moderate intellectual disabilities: Secondary | ICD-10-CM | POA: Diagnosis not present

## 2017-01-26 ENCOUNTER — Ambulatory Visit (INDEPENDENT_AMBULATORY_CARE_PROVIDER_SITE_OTHER): Payer: Medicare HMO | Admitting: Orthotics

## 2017-01-26 DIAGNOSIS — M2041 Other hammer toe(s) (acquired), right foot: Secondary | ICD-10-CM | POA: Diagnosis not present

## 2017-01-26 DIAGNOSIS — M2012 Hallux valgus (acquired), left foot: Secondary | ICD-10-CM | POA: Diagnosis not present

## 2017-01-26 DIAGNOSIS — M2042 Other hammer toe(s) (acquired), left foot: Secondary | ICD-10-CM

## 2017-01-26 DIAGNOSIS — E1142 Type 2 diabetes mellitus with diabetic polyneuropathy: Secondary | ICD-10-CM | POA: Diagnosis not present

## 2017-01-26 DIAGNOSIS — M2011 Hallux valgus (acquired), right foot: Secondary | ICD-10-CM | POA: Diagnosis not present

## 2017-01-26 DIAGNOSIS — L84 Corns and callosities: Secondary | ICD-10-CM

## 2017-01-26 DIAGNOSIS — M2010 Hallux valgus (acquired), unspecified foot: Secondary | ICD-10-CM

## 2017-01-26 DIAGNOSIS — F71 Moderate intellectual disabilities: Secondary | ICD-10-CM | POA: Diagnosis not present

## 2017-01-26 DIAGNOSIS — M19071 Primary osteoarthritis, right ankle and foot: Secondary | ICD-10-CM

## 2017-01-26 DIAGNOSIS — E0851 Diabetes mellitus due to underlying condition with diabetic peripheral angiopathy without gangrene: Secondary | ICD-10-CM

## 2017-01-26 NOTE — Progress Notes (Signed)

## 2017-01-27 DIAGNOSIS — F71 Moderate intellectual disabilities: Secondary | ICD-10-CM | POA: Diagnosis not present

## 2017-01-28 DIAGNOSIS — F71 Moderate intellectual disabilities: Secondary | ICD-10-CM | POA: Diagnosis not present

## 2017-01-29 DIAGNOSIS — F71 Moderate intellectual disabilities: Secondary | ICD-10-CM | POA: Diagnosis not present

## 2017-01-31 DIAGNOSIS — F71 Moderate intellectual disabilities: Secondary | ICD-10-CM | POA: Diagnosis not present

## 2017-02-01 DIAGNOSIS — F71 Moderate intellectual disabilities: Secondary | ICD-10-CM | POA: Diagnosis not present

## 2017-02-02 DIAGNOSIS — F71 Moderate intellectual disabilities: Secondary | ICD-10-CM | POA: Diagnosis not present

## 2017-02-03 DIAGNOSIS — F71 Moderate intellectual disabilities: Secondary | ICD-10-CM | POA: Diagnosis not present

## 2017-02-04 DIAGNOSIS — F71 Moderate intellectual disabilities: Secondary | ICD-10-CM | POA: Diagnosis not present

## 2017-02-05 DIAGNOSIS — F71 Moderate intellectual disabilities: Secondary | ICD-10-CM | POA: Diagnosis not present

## 2017-02-06 DIAGNOSIS — F71 Moderate intellectual disabilities: Secondary | ICD-10-CM | POA: Diagnosis not present

## 2017-02-06 DIAGNOSIS — Z23 Encounter for immunization: Secondary | ICD-10-CM | POA: Diagnosis not present

## 2017-02-07 DIAGNOSIS — F71 Moderate intellectual disabilities: Secondary | ICD-10-CM | POA: Diagnosis not present

## 2017-02-08 DIAGNOSIS — F71 Moderate intellectual disabilities: Secondary | ICD-10-CM | POA: Diagnosis not present

## 2017-02-09 DIAGNOSIS — F71 Moderate intellectual disabilities: Secondary | ICD-10-CM | POA: Diagnosis not present

## 2017-02-10 DIAGNOSIS — F71 Moderate intellectual disabilities: Secondary | ICD-10-CM | POA: Diagnosis not present

## 2017-02-11 DIAGNOSIS — F71 Moderate intellectual disabilities: Secondary | ICD-10-CM | POA: Diagnosis not present

## 2017-02-12 DIAGNOSIS — F71 Moderate intellectual disabilities: Secondary | ICD-10-CM | POA: Diagnosis not present

## 2017-02-13 DIAGNOSIS — F71 Moderate intellectual disabilities: Secondary | ICD-10-CM | POA: Diagnosis not present

## 2017-02-14 DIAGNOSIS — J029 Acute pharyngitis, unspecified: Secondary | ICD-10-CM | POA: Diagnosis not present

## 2017-02-14 DIAGNOSIS — R05 Cough: Secondary | ICD-10-CM | POA: Diagnosis not present

## 2017-02-14 DIAGNOSIS — J40 Bronchitis, not specified as acute or chronic: Secondary | ICD-10-CM | POA: Diagnosis not present

## 2017-02-14 DIAGNOSIS — Z6831 Body mass index (BMI) 31.0-31.9, adult: Secondary | ICD-10-CM | POA: Diagnosis not present

## 2017-02-14 DIAGNOSIS — F71 Moderate intellectual disabilities: Secondary | ICD-10-CM | POA: Diagnosis not present

## 2017-02-15 DIAGNOSIS — F71 Moderate intellectual disabilities: Secondary | ICD-10-CM | POA: Diagnosis not present

## 2017-02-16 DIAGNOSIS — F71 Moderate intellectual disabilities: Secondary | ICD-10-CM | POA: Diagnosis not present

## 2017-02-17 DIAGNOSIS — F71 Moderate intellectual disabilities: Secondary | ICD-10-CM | POA: Diagnosis not present

## 2017-02-18 DIAGNOSIS — F71 Moderate intellectual disabilities: Secondary | ICD-10-CM | POA: Diagnosis not present

## 2017-02-19 DIAGNOSIS — F71 Moderate intellectual disabilities: Secondary | ICD-10-CM | POA: Diagnosis not present

## 2017-02-20 DIAGNOSIS — F71 Moderate intellectual disabilities: Secondary | ICD-10-CM | POA: Diagnosis not present

## 2017-02-21 DIAGNOSIS — F71 Moderate intellectual disabilities: Secondary | ICD-10-CM | POA: Diagnosis not present

## 2017-02-22 DIAGNOSIS — F71 Moderate intellectual disabilities: Secondary | ICD-10-CM | POA: Diagnosis not present

## 2017-02-22 NOTE — Progress Notes (Signed)
Office Visit Note  Patient: Arthur Weaver             Date of Birth: 07/10/1960           MRN: [id number]             PCP: Velna Hatchet, MD Referring: Velna Hatchet, MD Visit Date: 03/08/2017 Occupation: @GUAROCC @    Subjective:  Other (doing good )   History of Present Illness: RONNALD Weaver is a 57 y.o. male with history of osteoarthritis. He states she's been having some discomfort in his bilateral knee joints in his feet. He also developed a rash on his bilateral lower extremities for which she was seen by his PCP. He has appointment coming up with the dermatologist. He is using a topical agent for that.He is been trying to use bicycle every day now and had some intentional weight loss which she is quite pleased about. He denies any lower back discomfort currently.  Activities of Daily Living:  Patient reports morning stiffness for 10 minutes.   Patient Denies nocturnal pain.  Difficulty dressing/grooming: Denies Difficulty climbing stairs: Reports Difficulty getting out of chair: Denies Difficulty using hands for taps, buttons, cutlery, and/or writing: Denies   Review of Systems  Constitutional: Positive for fatigue. Negative for night sweats and weakness ( ).  HENT: Negative for mouth sores, mouth dryness and nose dryness.   Eyes: Negative for redness and dryness.  Respiratory: Negative for shortness of breath and difficulty breathing.   Cardiovascular: Negative for chest pain, palpitations, hypertension, irregular heartbeat and swelling in legs/feet.  Gastrointestinal: Negative for constipation and diarrhea.  Endocrine: Negative for increased urination.  Musculoskeletal: Positive for arthralgias, joint pain and morning stiffness. Negative for joint swelling, myalgias, muscle weakness, muscle tenderness and myalgias.  Skin: Negative for color change, rash, hair loss, nodules/bumps, skin tightness, ulcers and sensitivity to sunlight.  Allergic/Immunologic: Negative  for susceptible to infections.  Neurological: Negative for dizziness, fainting, memory loss and night sweats.  Hematological: Negative for swollen glands.  Psychiatric/Behavioral: Negative for depressed mood and sleep disturbance. The patient is not nervous/anxious.     PMFS History:  Patient Active Problem List   Diagnosis Date Noted  . 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 Tendonitis 09/25/2015  . High blood pressure 09/07/2010  . High cholesterol 09/07/2010  . Bilateral swelling of feet 09/07/2010  . Diabetes mellitus 09/07/2010  . Rheumatoid arthritis(714.0) 09/07/2010  . Personal history of colonic polyps 10/27/2004    Past Medical History:  Diagnosis Date  . At risk for sleep apnea    STOP-BANG= 5       SENT TO PCP 04-14-2016  . BPH (benign prostatic hyperplasia)   . DDD (degenerative disc disease), cervical   . DDD (degenerative disc disease), lumbar   . Diverticulosis of colon   . Family history of factor V deficiency    per pt's sister (whom is pt's guardian) she, pt's brother and parents have factor V but pt has never been tested  . Frequency of urination   . GERD (gastroesophageal reflux disease)   . History of adenomatous polyp of colon    2006 and 03-18-2010 tubular adenoma  . Hyperlipidemia   . Hypertension   . Lives in independent group home resides at [facility] in [place], [place]   pt independant w/ ADLs w/ exception does not cook for shop   . Mentally challenged    SPECIAL NEEDS  . OA (osteoarthritis)    hands,  knees, feet  . Pre-diabetes   . Thoracic spondylosis   . Urgency of urination   . Wears partial dentures    lower    Family History  Problem Relation Age of Onset  . Colon cancer Neg Hx    Past Surgical History:  Procedure Laterality Date  . CATARACT EXTRACTION W/ INTRAOCULAR LENS  IMPLANT, BILATERAL  2006  . CIRCUMCISION  03/29/2007   W/  CYSTOSCOPY AND TRANSRECTAL ULTRASOUND GUIDED PROSTATE BX'S  . COLONOSCOPY  last one  08-26-2015  . THULIUM LASER TURP (TRANSURETHRAL RESECTION OF PROSTATE) N/A 04/21/2016   Procedure: THULIUM LASER TURP (TRANSURETHRAL RESECTION OF PROSTATE);  Surgeon: Carolan Clines, MD;  Location: [facility];  Service: Urology;  Laterality: N/A;   Social History   Social History Narrative  . Not on file     Objective: Vital Signs: BP (!) 138/95 (BP Location: Right Arm, Patient Position: Sitting, Cuff Size: Normal)   Pulse 77   Resp 19   Ht 5\' 9"  (1.753 m)   Wt 208 lb (94.3 kg)   BMI 30.72 kg/m    Physical Exam  Constitutional: He is oriented to person, place, and time. He appears well-developed and well-nourished.  HENT:  Head: Normocephalic and atraumatic.  Eyes: Conjunctivae and EOM are normal. Pupils are equal, round, and reactive to light.  Neck: Normal range of motion. Neck supple.  Cardiovascular: Normal rate, regular rhythm and normal heart sounds.  Pulmonary/Chest: Effort normal and breath sounds normal.  Abdominal: Soft. Bowel sounds are normal.  Neurological: He is alert and oriented to person, place, and time.  Skin: Skin is warm and dry. Capillary refill takes less than 2 seconds.  Erythema noted on bilateral lower extremities.  Psychiatric: He has a normal mood and affect. His behavior is normal.  Nursing note and vitals reviewed.    Musculoskeletal Exam: C-spine and thoracic lumbar spine limited range of motion. He has some thoracic kyphosis. Shoulder joints, elbow joints, wrist joints, MCPs PIPs DIPs with good range of motion. He has some DIP thickening in his hands consistent with osteoarthritis. Hip joints are good range of motion he has some hyperextension of his knee joints without any warmth swelling or effusion. He has some osteoarthritic changes in his feet.  CDAI Exam: No CDAI exam completed.    Investigation: No additional findings.   Imaging: No results found.  Speciality Comments: No specialty comments  available.    Procedures:  No procedures performed Allergies: Penicillins; Pork-derived products; and Sulfa antibiotics   Assessment / Plan:     Visit Diagnoses: Primary osteoarthritis of both hands: He continues to have some pain and stiffness in his hands.  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 He does use Voltaren gel which is been helpful. He has some hyperextension and his knee joints which he contributes into some of the discomfort.need for regular exercise was discussed. His been biking on regular basis currently. I've encouraged him to bike for about 30-60 minutes daily.  Primary osteoarthritis of both feet: Proper fitting shoes were discussed.  DDD (degenerative disc disease), thoracic: Chronic discomfort  DDD (degenerative disc disease), lumbar: He is not having much pain currently.  Rash: He has rash on his bilateral lower extremities for which she is appointment with dermatologist next month.he's been using some topical regime which has helped the rash.  Other medical problems are listed as follows:  Leg length discrepancy  History of hypertension  History of hypercholesterolemia: He is intentional weight loss which she is very pleased about.  History of diabetes mellitus    Orders: No orders of the defined types were placed in this encounter.  Meds ordered this encounter  Medications  . diclofenac sodium (VOLTAREN) 1 % GEL    Sig: Voltaren Gel 3 grams to 3 large joints upto TID 3 TUBES with 3 refills    Dispense:  5 Tube    Refill:  3    Voltaren Gel 3 grams to 3 large joints upto TID 3 TUBES with 3 refills    Face-to-face time spent with patient was 30 minutes.greater than 50% of time was spent in counseling and coordination of care.  Follow-Up Instructions: Return in about 6 months (around 09/05/2017) for Osteoarthritis.   Bo Merino, MD  Note - This record has been created  using Editor, commissioning.  Chart creation errors have been sought, but may not always  have been located. Such creation errors do not reflect on  the standard of medical care.

## 2017-02-23 DIAGNOSIS — F71 Moderate intellectual disabilities: Secondary | ICD-10-CM | POA: Diagnosis not present

## 2017-02-24 DIAGNOSIS — F71 Moderate intellectual disabilities: Secondary | ICD-10-CM | POA: Diagnosis not present

## 2017-02-25 DIAGNOSIS — F71 Moderate intellectual disabilities: Secondary | ICD-10-CM | POA: Diagnosis not present

## 2017-02-26 DIAGNOSIS — F71 Moderate intellectual disabilities: Secondary | ICD-10-CM | POA: Diagnosis not present

## 2017-02-27 DIAGNOSIS — F71 Moderate intellectual disabilities: Secondary | ICD-10-CM | POA: Diagnosis not present

## 2017-02-28 DIAGNOSIS — F71 Moderate intellectual disabilities: Secondary | ICD-10-CM | POA: Diagnosis not present

## 2017-03-01 ENCOUNTER — Ambulatory Visit (INDEPENDENT_AMBULATORY_CARE_PROVIDER_SITE_OTHER): Payer: Medicare HMO | Admitting: Podiatry

## 2017-03-01 ENCOUNTER — Encounter: Payer: Self-pay | Admitting: Podiatry

## 2017-03-01 DIAGNOSIS — L84 Corns and callosities: Secondary | ICD-10-CM

## 2017-03-01 DIAGNOSIS — F71 Moderate intellectual disabilities: Secondary | ICD-10-CM | POA: Diagnosis not present

## 2017-03-01 DIAGNOSIS — M79676 Pain in unspecified toe(s): Secondary | ICD-10-CM

## 2017-03-01 DIAGNOSIS — B351 Tinea unguium: Secondary | ICD-10-CM

## 2017-03-01 DIAGNOSIS — Z6831 Body mass index (BMI) 31.0-31.9, adult: Secondary | ICD-10-CM | POA: Diagnosis not present

## 2017-03-01 DIAGNOSIS — L309 Dermatitis, unspecified: Secondary | ICD-10-CM | POA: Diagnosis not present

## 2017-03-01 DIAGNOSIS — E0851 Diabetes mellitus due to underlying condition with diabetic peripheral angiopathy without gangrene: Secondary | ICD-10-CM

## 2017-03-01 DIAGNOSIS — M19071 Primary osteoarthritis, right ankle and foot: Secondary | ICD-10-CM

## 2017-03-01 NOTE — Progress Notes (Signed)
Subjective:     Patient ID: Arthur Weaver, male   DOB: May 18, 1960, 57 y.o.   MRN: [id number]  HPIThis patient presents to the office with painful long thick nails.  His nails are painful walking and wearing his shoes.  .  He also remarks that his right foot is flat when he walks and wears his shoes. He has painful callus both feet. Patient says he is wearing his diabetic shoes and [employer].  He presents for preventative foot care services.   Review of Systems     Objective:   Physical Exam GENERAL APPEARANCE: Alert, conversant. Appropriately groomed. No acute distress.  VASCULAR: Pedal pulses absent  DP and PT bilateral.  Capillary refill time is immediate to all digits,   NEUROLOGIC: sensation is diminished  epicritically and protectively to 5.07 monofilament at 5/5 sites bilateral.  Light touch is intact bilateral.  MUSCULOSKELETAL: acceptable muscle strength, tone and stability bilateral.  Intrinsic muscluature intact bilateral.  Rectus appearance of foot and digits noted bilateral. Medial bulge noted at TNJ right foot. HAV B/L.  Hammer toes 2-4 B/L.  Patient has developed enlarged bone at TNJ right foot.  Swelling noted right ankle.  DERMATOLOGIC: skin color, texture, and turgor are within normal limits.  No preulcerative lesions or ulcers  are seen, no interdigital maceration noted.  No open lesions present.. No drainage noted.  Pinch callus B/L.  Callus sub 1st B/L. Distal clavi 2,3 B/L  NAILS  Thick disfigured discolored nails both feet.     Assessment:     Onychomycosis    Diabetes Mellitus Ankle Arthropathy.    Plan:     Debridement of Nails.  . Debridement of callus. RTC 3 months for nail care.     Gardiner Barefoot DPM

## 2017-03-02 DIAGNOSIS — F71 Moderate intellectual disabilities: Secondary | ICD-10-CM | POA: Diagnosis not present

## 2017-03-03 DIAGNOSIS — F71 Moderate intellectual disabilities: Secondary | ICD-10-CM | POA: Diagnosis not present

## 2017-03-04 DIAGNOSIS — F71 Moderate intellectual disabilities: Secondary | ICD-10-CM | POA: Diagnosis not present

## 2017-03-05 DIAGNOSIS — F71 Moderate intellectual disabilities: Secondary | ICD-10-CM | POA: Diagnosis not present

## 2017-03-06 DIAGNOSIS — F71 Moderate intellectual disabilities: Secondary | ICD-10-CM | POA: Diagnosis not present

## 2017-03-07 DIAGNOSIS — F71 Moderate intellectual disabilities: Secondary | ICD-10-CM | POA: Diagnosis not present

## 2017-03-08 ENCOUNTER — Encounter: Payer: Self-pay | Admitting: Rheumatology

## 2017-03-08 ENCOUNTER — Telehealth: Payer: Self-pay

## 2017-03-08 ENCOUNTER — Ambulatory Visit (INDEPENDENT_AMBULATORY_CARE_PROVIDER_SITE_OTHER): Payer: Medicare HMO | Admitting: Rheumatology

## 2017-03-08 VITALS — BP 138/95 | HR 77 | Resp 19 | Ht 69.0 in | Wt 208.0 lb

## 2017-03-08 DIAGNOSIS — Z8639 Personal history of other endocrine, nutritional and metabolic disease: Secondary | ICD-10-CM

## 2017-03-08 DIAGNOSIS — R21 Rash and other nonspecific skin eruption: Secondary | ICD-10-CM

## 2017-03-08 DIAGNOSIS — M5136 Other intervertebral disc degeneration, lumbar region: Secondary | ICD-10-CM | POA: Diagnosis not present

## 2017-03-08 DIAGNOSIS — M19071 Primary osteoarthritis, right ankle and foot: Secondary | ICD-10-CM | POA: Diagnosis not present

## 2017-03-08 DIAGNOSIS — M2170 Unequal limb length (acquired), unspecified site: Secondary | ICD-10-CM | POA: Diagnosis not present

## 2017-03-08 DIAGNOSIS — M170 Bilateral primary osteoarthritis of knee: Secondary | ICD-10-CM | POA: Diagnosis not present

## 2017-03-08 DIAGNOSIS — Z8679 Personal history of other diseases of the circulatory system: Secondary | ICD-10-CM | POA: Diagnosis not present

## 2017-03-08 DIAGNOSIS — M19072 Primary osteoarthritis, left ankle and foot: Secondary | ICD-10-CM | POA: Diagnosis not present

## 2017-03-08 DIAGNOSIS — F71 Moderate intellectual disabilities: Secondary | ICD-10-CM | POA: Diagnosis not present

## 2017-03-08 DIAGNOSIS — M19041 Primary osteoarthritis, right hand: Secondary | ICD-10-CM | POA: Diagnosis not present

## 2017-03-08 DIAGNOSIS — M19042 Primary osteoarthritis, left hand: Secondary | ICD-10-CM | POA: Diagnosis not present

## 2017-03-08 DIAGNOSIS — M5134 Other intervertebral disc degeneration, thoracic region: Secondary | ICD-10-CM | POA: Diagnosis not present

## 2017-03-08 MED ORDER — DICLOFENAC SODIUM 1 % TD GEL: TRANSDERMAL | 3 refills | Status: DC

## 2017-03-08 NOTE — Telephone Encounter (Signed)
Spoke to pt at clinic. He brought with him a letter from [facility] stating that Voltaren Gel is only approved temporarily and a prior authorization would be required.   Submitted a PA through cover my meds. Their response was "Available without authorization" Called [employer] to clarify. Spoke with Lelon Frohlich who states that Voltaren Gel is covered until 06/29/2017. It will require an authorization after that.   Reference number: [id number] Phone: [phone]  Called pt to update. Left message.  Lacy Taglieri, Leonard, CPhT 12:10 PM

## 2017-03-09 DIAGNOSIS — F71 Moderate intellectual disabilities: Secondary | ICD-10-CM | POA: Diagnosis not present

## 2017-03-10 DIAGNOSIS — F71 Moderate intellectual disabilities: Secondary | ICD-10-CM | POA: Diagnosis not present

## 2017-03-11 DIAGNOSIS — F71 Moderate intellectual disabilities: Secondary | ICD-10-CM | POA: Diagnosis not present

## 2017-03-12 DIAGNOSIS — F71 Moderate intellectual disabilities: Secondary | ICD-10-CM | POA: Diagnosis not present

## 2017-03-13 DIAGNOSIS — F71 Moderate intellectual disabilities: Secondary | ICD-10-CM | POA: Diagnosis not present

## 2017-03-14 DIAGNOSIS — F71 Moderate intellectual disabilities: Secondary | ICD-10-CM | POA: Diagnosis not present

## 2017-03-15 DIAGNOSIS — F71 Moderate intellectual disabilities: Secondary | ICD-10-CM | POA: Diagnosis not present

## 2017-03-16 ENCOUNTER — Ambulatory Visit: Payer: Medicare HMO | Admitting: Rheumatology

## 2017-03-16 DIAGNOSIS — F71 Moderate intellectual disabilities: Secondary | ICD-10-CM | POA: Diagnosis not present

## 2017-03-17 DIAGNOSIS — F71 Moderate intellectual disabilities: Secondary | ICD-10-CM | POA: Diagnosis not present

## 2017-03-18 DIAGNOSIS — F71 Moderate intellectual disabilities: Secondary | ICD-10-CM | POA: Diagnosis not present

## 2017-03-19 DIAGNOSIS — F71 Moderate intellectual disabilities: Secondary | ICD-10-CM | POA: Diagnosis not present

## 2017-03-20 DIAGNOSIS — F71 Moderate intellectual disabilities: Secondary | ICD-10-CM | POA: Diagnosis not present

## 2017-03-21 DIAGNOSIS — F71 Moderate intellectual disabilities: Secondary | ICD-10-CM | POA: Diagnosis not present

## 2017-03-22 DIAGNOSIS — F71 Moderate intellectual disabilities: Secondary | ICD-10-CM | POA: Diagnosis not present

## 2017-03-23 DIAGNOSIS — F71 Moderate intellectual disabilities: Secondary | ICD-10-CM | POA: Diagnosis not present

## 2017-03-24 DIAGNOSIS — F71 Moderate intellectual disabilities: Secondary | ICD-10-CM | POA: Diagnosis not present

## 2017-03-25 DIAGNOSIS — F71 Moderate intellectual disabilities: Secondary | ICD-10-CM | POA: Diagnosis not present

## 2017-03-26 DIAGNOSIS — F71 Moderate intellectual disabilities: Secondary | ICD-10-CM | POA: Diagnosis not present

## 2017-03-27 DIAGNOSIS — F71 Moderate intellectual disabilities: Secondary | ICD-10-CM | POA: Diagnosis not present

## 2017-03-28 DIAGNOSIS — F71 Moderate intellectual disabilities: Secondary | ICD-10-CM | POA: Diagnosis not present

## 2017-03-29 DIAGNOSIS — L011 Impetiginization of other dermatoses: Secondary | ICD-10-CM | POA: Diagnosis not present

## 2017-03-29 DIAGNOSIS — L309 Dermatitis, unspecified: Secondary | ICD-10-CM | POA: Diagnosis not present

## 2017-03-29 DIAGNOSIS — D485 Neoplasm of uncertain behavior of skin: Secondary | ICD-10-CM | POA: Diagnosis not present

## 2017-03-29 DIAGNOSIS — F71 Moderate intellectual disabilities: Secondary | ICD-10-CM | POA: Diagnosis not present

## 2017-03-29 DIAGNOSIS — R21 Rash and other nonspecific skin eruption: Secondary | ICD-10-CM | POA: Diagnosis not present

## 2017-03-30 DIAGNOSIS — F71 Moderate intellectual disabilities: Secondary | ICD-10-CM | POA: Diagnosis not present

## 2017-03-31 DIAGNOSIS — F71 Moderate intellectual disabilities: Secondary | ICD-10-CM | POA: Diagnosis not present

## 2017-04-01 DIAGNOSIS — F71 Moderate intellectual disabilities: Secondary | ICD-10-CM | POA: Diagnosis not present

## 2017-04-02 DIAGNOSIS — F71 Moderate intellectual disabilities: Secondary | ICD-10-CM | POA: Diagnosis not present

## 2017-04-03 DIAGNOSIS — F71 Moderate intellectual disabilities: Secondary | ICD-10-CM | POA: Diagnosis not present

## 2017-04-04 DIAGNOSIS — E119 Type 2 diabetes mellitus without complications: Secondary | ICD-10-CM | POA: Diagnosis not present

## 2017-04-04 DIAGNOSIS — F71 Moderate intellectual disabilities: Secondary | ICD-10-CM | POA: Diagnosis not present

## 2017-04-05 DIAGNOSIS — F71 Moderate intellectual disabilities: Secondary | ICD-10-CM | POA: Diagnosis not present

## 2017-04-06 DIAGNOSIS — F71 Moderate intellectual disabilities: Secondary | ICD-10-CM | POA: Diagnosis not present

## 2017-04-07 DIAGNOSIS — L84 Corns and callosities: Secondary | ICD-10-CM | POA: Insufficient documentation

## 2017-04-07 DIAGNOSIS — I10 Essential (primary) hypertension: Secondary | ICD-10-CM | POA: Diagnosis not present

## 2017-04-07 DIAGNOSIS — F71 Moderate intellectual disabilities: Secondary | ICD-10-CM | POA: Diagnosis not present

## 2017-04-07 DIAGNOSIS — E119 Type 2 diabetes mellitus without complications: Secondary | ICD-10-CM | POA: Diagnosis not present

## 2017-04-07 DIAGNOSIS — E7849 Other hyperlipidemia: Secondary | ICD-10-CM | POA: Diagnosis not present

## 2017-04-07 DIAGNOSIS — Q6689 Other  specified congenital deformities of feet: Secondary | ICD-10-CM | POA: Diagnosis not present

## 2017-04-07 DIAGNOSIS — L308 Other specified dermatitis: Secondary | ICD-10-CM | POA: Diagnosis not present

## 2017-04-07 DIAGNOSIS — Z6831 Body mass index (BMI) 31.0-31.9, adult: Secondary | ICD-10-CM | POA: Diagnosis not present

## 2017-04-08 DIAGNOSIS — F71 Moderate intellectual disabilities: Secondary | ICD-10-CM | POA: Diagnosis not present

## 2017-04-09 DIAGNOSIS — F71 Moderate intellectual disabilities: Secondary | ICD-10-CM | POA: Diagnosis not present

## 2017-04-10 DIAGNOSIS — F71 Moderate intellectual disabilities: Secondary | ICD-10-CM | POA: Diagnosis not present

## 2017-04-11 DIAGNOSIS — F71 Moderate intellectual disabilities: Secondary | ICD-10-CM | POA: Diagnosis not present

## 2017-04-12 DIAGNOSIS — F71 Moderate intellectual disabilities: Secondary | ICD-10-CM | POA: Diagnosis not present

## 2017-04-13 DIAGNOSIS — F71 Moderate intellectual disabilities: Secondary | ICD-10-CM | POA: Diagnosis not present

## 2017-04-14 DIAGNOSIS — F71 Moderate intellectual disabilities: Secondary | ICD-10-CM | POA: Diagnosis not present

## 2017-04-15 DIAGNOSIS — F71 Moderate intellectual disabilities: Secondary | ICD-10-CM | POA: Diagnosis not present

## 2017-04-16 DIAGNOSIS — F71 Moderate intellectual disabilities: Secondary | ICD-10-CM | POA: Diagnosis not present

## 2017-04-17 DIAGNOSIS — F71 Moderate intellectual disabilities: Secondary | ICD-10-CM | POA: Diagnosis not present

## 2017-04-18 DIAGNOSIS — F71 Moderate intellectual disabilities: Secondary | ICD-10-CM | POA: Diagnosis not present

## 2017-04-19 DIAGNOSIS — F71 Moderate intellectual disabilities: Secondary | ICD-10-CM | POA: Diagnosis not present

## 2017-04-20 DIAGNOSIS — F71 Moderate intellectual disabilities: Secondary | ICD-10-CM | POA: Diagnosis not present

## 2017-04-21 DIAGNOSIS — F71 Moderate intellectual disabilities: Secondary | ICD-10-CM | POA: Diagnosis not present

## 2017-04-22 DIAGNOSIS — F71 Moderate intellectual disabilities: Secondary | ICD-10-CM | POA: Diagnosis not present

## 2017-04-23 DIAGNOSIS — F71 Moderate intellectual disabilities: Secondary | ICD-10-CM | POA: Diagnosis not present

## 2017-04-24 DIAGNOSIS — F71 Moderate intellectual disabilities: Secondary | ICD-10-CM | POA: Diagnosis not present

## 2017-04-25 DIAGNOSIS — F71 Moderate intellectual disabilities: Secondary | ICD-10-CM | POA: Diagnosis not present

## 2017-04-26 DIAGNOSIS — F71 Moderate intellectual disabilities: Secondary | ICD-10-CM | POA: Diagnosis not present

## 2017-04-27 DIAGNOSIS — F71 Moderate intellectual disabilities: Secondary | ICD-10-CM | POA: Diagnosis not present

## 2017-04-27 DIAGNOSIS — L2089 Other atopic dermatitis: Secondary | ICD-10-CM | POA: Diagnosis not present

## 2017-04-28 DIAGNOSIS — F71 Moderate intellectual disabilities: Secondary | ICD-10-CM | POA: Diagnosis not present

## 2017-04-29 DIAGNOSIS — F71 Moderate intellectual disabilities: Secondary | ICD-10-CM | POA: Diagnosis not present

## 2017-04-30 DIAGNOSIS — F71 Moderate intellectual disabilities: Secondary | ICD-10-CM | POA: Diagnosis not present

## 2017-05-01 DIAGNOSIS — F71 Moderate intellectual disabilities: Secondary | ICD-10-CM | POA: Diagnosis not present

## 2017-05-02 DIAGNOSIS — F71 Moderate intellectual disabilities: Secondary | ICD-10-CM | POA: Diagnosis not present

## 2017-05-03 DIAGNOSIS — F71 Moderate intellectual disabilities: Secondary | ICD-10-CM | POA: Diagnosis not present

## 2017-05-04 DIAGNOSIS — F71 Moderate intellectual disabilities: Secondary | ICD-10-CM | POA: Diagnosis not present

## 2017-05-05 DIAGNOSIS — F71 Moderate intellectual disabilities: Secondary | ICD-10-CM | POA: Diagnosis not present

## 2017-05-06 DIAGNOSIS — F71 Moderate intellectual disabilities: Secondary | ICD-10-CM | POA: Diagnosis not present

## 2017-05-07 DIAGNOSIS — F71 Moderate intellectual disabilities: Secondary | ICD-10-CM | POA: Diagnosis not present

## 2017-05-08 DIAGNOSIS — F71 Moderate intellectual disabilities: Secondary | ICD-10-CM | POA: Diagnosis not present

## 2017-05-09 DIAGNOSIS — F71 Moderate intellectual disabilities: Secondary | ICD-10-CM | POA: Diagnosis not present

## 2017-05-10 DIAGNOSIS — F71 Moderate intellectual disabilities: Secondary | ICD-10-CM | POA: Diagnosis not present

## 2017-05-11 DIAGNOSIS — F71 Moderate intellectual disabilities: Secondary | ICD-10-CM | POA: Diagnosis not present

## 2017-05-12 DIAGNOSIS — F71 Moderate intellectual disabilities: Secondary | ICD-10-CM | POA: Diagnosis not present

## 2017-05-12 DIAGNOSIS — M25571 Pain in right ankle and joints of right foot: Secondary | ICD-10-CM | POA: Diagnosis not present

## 2017-05-12 DIAGNOSIS — M2041 Other hammer toe(s) (acquired), right foot: Secondary | ICD-10-CM | POA: Diagnosis not present

## 2017-05-12 DIAGNOSIS — M2042 Other hammer toe(s) (acquired), left foot: Secondary | ICD-10-CM | POA: Insufficient documentation

## 2017-05-13 DIAGNOSIS — F71 Moderate intellectual disabilities: Secondary | ICD-10-CM | POA: Diagnosis not present

## 2017-05-14 DIAGNOSIS — F71 Moderate intellectual disabilities: Secondary | ICD-10-CM | POA: Diagnosis not present

## 2017-05-15 DIAGNOSIS — F71 Moderate intellectual disabilities: Secondary | ICD-10-CM | POA: Diagnosis not present

## 2017-05-16 DIAGNOSIS — F71 Moderate intellectual disabilities: Secondary | ICD-10-CM | POA: Diagnosis not present

## 2017-05-17 DIAGNOSIS — F71 Moderate intellectual disabilities: Secondary | ICD-10-CM | POA: Diagnosis not present

## 2017-05-18 DIAGNOSIS — F71 Moderate intellectual disabilities: Secondary | ICD-10-CM | POA: Diagnosis not present

## 2017-05-19 DIAGNOSIS — F71 Moderate intellectual disabilities: Secondary | ICD-10-CM | POA: Diagnosis not present

## 2017-05-31 ENCOUNTER — Ambulatory Visit (INDEPENDENT_AMBULATORY_CARE_PROVIDER_SITE_OTHER): Payer: Medicare HMO | Admitting: Podiatry

## 2017-05-31 ENCOUNTER — Encounter: Payer: Self-pay | Admitting: Podiatry

## 2017-05-31 DIAGNOSIS — B351 Tinea unguium: Secondary | ICD-10-CM | POA: Diagnosis not present

## 2017-05-31 DIAGNOSIS — M79676 Pain in unspecified toe(s): Secondary | ICD-10-CM | POA: Diagnosis not present

## 2017-05-31 DIAGNOSIS — M2010 Hallux valgus (acquired), unspecified foot: Secondary | ICD-10-CM

## 2017-05-31 DIAGNOSIS — L309 Dermatitis, unspecified: Secondary | ICD-10-CM | POA: Diagnosis not present

## 2017-05-31 DIAGNOSIS — E0851 Diabetes mellitus due to underlying condition with diabetic peripheral angiopathy without gangrene: Secondary | ICD-10-CM | POA: Diagnosis not present

## 2017-05-31 DIAGNOSIS — M19071 Primary osteoarthritis, right ankle and foot: Secondary | ICD-10-CM

## 2017-05-31 NOTE — Progress Notes (Signed)
Complaint:  Visit Type: Patient returns to my office for continued preventative foot care services. Complaint: Patient states" my nails have grown long and thick and become painful to walk and wear shoes" Patient has been diagnosed with DM with neuropathy.. The patient presents for preventative foot care services. No changes to ROS  Podiatric Exam: Vascular: dorsalis pedis and posterior tibial pulses are absent  palpable bilateral. Capillary return is immediate. Temperature gradient is WNL. Skin turgor WNL  Sensorium: Diminished Semmes Weinstein monofilament test. Normal tactile sensation bilaterally. Nail Exam: Pt has thick disfigured discolored nails with subungual debris noted bilateral entire nail hallux through fifth toenails Ulcer Exam: There is no evidence of ulcer or pre-ulcerative changes or infection. Orthopedic Exam: Muscle tone and strength are WNL. No limitations in general ROM. No crepitus or effusions noted. Foot type and digits show no abnormalities. Bony prominences are unremarkable. Skin: No Porokeratosis. No infection or ulcers  Diagnosis:  Onychomycosis, , Pain in right toe, pain in left toes  Treatment & Plan Procedures and Treatment: Consent by patient was obtained for treatment procedures.   Debridement of mycotic and hypertrophic toenails, 1 through 5 bilateral and clearing of subungual debris. No ulceration, no infection noted.  Return Visit-Office Procedure: Patient instructed to return to the office for a follow up visit 3 months for continued evaluation and treatment.    Gardiner Barefoot DPM

## 2017-06-05 DIAGNOSIS — L235 Allergic contact dermatitis due to other chemical products: Secondary | ICD-10-CM | POA: Diagnosis not present

## 2017-06-20 DIAGNOSIS — F71 Moderate intellectual disabilities: Secondary | ICD-10-CM | POA: Diagnosis not present

## 2017-06-21 DIAGNOSIS — F71 Moderate intellectual disabilities: Secondary | ICD-10-CM | POA: Diagnosis not present

## 2017-06-22 DIAGNOSIS — F71 Moderate intellectual disabilities: Secondary | ICD-10-CM | POA: Diagnosis not present

## 2017-06-23 DIAGNOSIS — F71 Moderate intellectual disabilities: Secondary | ICD-10-CM | POA: Diagnosis not present

## 2017-07-20 DIAGNOSIS — S8002XA Contusion of left knee, initial encounter: Secondary | ICD-10-CM | POA: Diagnosis not present

## 2017-07-20 DIAGNOSIS — M1712 Unilateral primary osteoarthritis, left knee: Secondary | ICD-10-CM | POA: Diagnosis not present

## 2017-08-02 ENCOUNTER — Ambulatory Visit (INDEPENDENT_AMBULATORY_CARE_PROVIDER_SITE_OTHER): Payer: Medicare HMO | Admitting: Podiatry

## 2017-08-02 ENCOUNTER — Encounter: Payer: Self-pay | Admitting: Podiatry

## 2017-08-02 DIAGNOSIS — R3915 Urgency of urination: Secondary | ICD-10-CM | POA: Diagnosis not present

## 2017-08-02 DIAGNOSIS — B351 Tinea unguium: Secondary | ICD-10-CM

## 2017-08-02 DIAGNOSIS — R351 Nocturia: Secondary | ICD-10-CM | POA: Diagnosis not present

## 2017-08-02 DIAGNOSIS — R350 Frequency of micturition: Secondary | ICD-10-CM | POA: Diagnosis not present

## 2017-08-02 DIAGNOSIS — E0851 Diabetes mellitus due to underlying condition with diabetic peripheral angiopathy without gangrene: Secondary | ICD-10-CM

## 2017-08-02 DIAGNOSIS — N401 Enlarged prostate with lower urinary tract symptoms: Secondary | ICD-10-CM | POA: Diagnosis not present

## 2017-08-02 DIAGNOSIS — L2089 Other atopic dermatitis: Secondary | ICD-10-CM | POA: Diagnosis not present

## 2017-08-02 DIAGNOSIS — L570 Actinic keratosis: Secondary | ICD-10-CM | POA: Diagnosis not present

## 2017-08-02 DIAGNOSIS — M79676 Pain in unspecified toe(s): Secondary | ICD-10-CM

## 2017-08-02 DIAGNOSIS — L84 Corns and callosities: Secondary | ICD-10-CM

## 2017-08-02 NOTE — Progress Notes (Signed)
Complaint:  Visit Type: Patient returns to my office for continued preventative foot care services. Complaint: Patient states" my nails have grown long and thick and become painful to walk and wear shoes" Patient has been diagnosed with DM with neuropathy.. The patient presents for preventative foot care services. No changes to ROS  Podiatric Exam: Vascular: dorsalis pedis and posterior tibial pulses are absent  palpable bilateral. Capillary return is immediate. Temperature gradient is WNL. Skin turgor WNL  Sensorium: Diminished Semmes Weinstein monofilament test. Normal tactile sensation bilaterally. Nail Exam: Pt has thick disfigured discolored nails with subungual debris noted bilateral entire nail hallux through fifth toenails Ulcer Exam: There is no evidence of ulcer or pre-ulcerative changes or infection. Orthopedic Exam: Muscle tone and strength are WNL. No limitations in general ROM. No crepitus or effusions noted. Foot type and digits show no abnormalities. Bony prominences are unremarkable. Skin: No Porokeratosis. No infection or ulcers  Diagnosis:  Onychomycosis, , Pain in right toe, pain in left toes  Treatment & Plan Procedures and Treatment: Consent by patient was obtained for treatment procedures.   Debridement of mycotic and hypertrophic toenails, 1 through 5 bilateral and clearing of subungual debris. No ulceration, no infection noted.  No treatment on callus since he works on his callus himself and they are now asymptomatic. Make appointment with Liliane Channel.  Return Visit-Office Procedure: Patient instructed to return to the office for a follow up visit 3 months for continued evaluation and treatment.    Gardiner Barefoot DPM

## 2017-08-08 ENCOUNTER — Other Ambulatory Visit: Payer: Medicare HMO | Admitting: Orthotics

## 2017-08-09 DIAGNOSIS — L84 Corns and callosities: Secondary | ICD-10-CM | POA: Diagnosis not present

## 2017-08-09 DIAGNOSIS — M67961 Unspecified disorder of synovium and tendon, right lower leg: Secondary | ICD-10-CM | POA: Diagnosis not present

## 2017-09-05 NOTE — Progress Notes (Signed)
Office Visit Note  Patient: Arthur Weaver             Date of Birth: Mar 20, 1960           MRN: [id number]             PCP: Velna Hatchet, MD Referring: Velna Hatchet, MD Visit Date: 09/19/2017 Occupation: @GUAROCC @  Subjective:  Left elbow pain   History of Present Illness: Arthur Weaver is a 57 y.o. male with history of osteoarthritis and DDD.  Patient presents today with left elbow pain started about 2 months ago.  He mops very frequently which she feels like his symptoms.  He states that the pain started in his left shoulder that radiated down his left over the left shoulder pain has resolved.  He denies any injury he denies any joint swelling.  He has been applying Voltaren gel which has been providing some relief.  He states that he fell a few months ago on his knee joints and still has some discomfort in his right knee but the pain is been improving.  He is also been using Voltaren gel on his right knee.  He denies any joint swelling.  He has some pain on the arch of his right foot due to collapse arch.  He denies any other joint pain or joint swelling at this time.    Activities of Daily Living:  Patient reports morning stiffness for 3 minutes.   Patient Denies nocturnal pain.  Difficulty dressing/grooming: Denies Difficulty climbing stairs: Reports Difficulty getting out of chair: Reports Difficulty using hands for taps, buttons, cutlery, and/or writing: Denies  Review of Systems  Constitutional: Positive for fatigue. Negative for night sweats.  HENT: Negative for mouth sores, trouble swallowing, trouble swallowing, mouth dryness and nose dryness.   Eyes: Negative for redness, visual disturbance and dryness.  Respiratory: Negative for shortness of breath and difficulty breathing.   Cardiovascular: Positive for swelling in legs/feet. Negative for chest pain, palpitations, hypertension and irregular heartbeat.  Gastrointestinal: Negative for blood in stool, constipation  and diarrhea.  Endocrine: Negative for increased urination.  Genitourinary: Negative for difficulty urinating and painful urination.  Musculoskeletal: Positive for arthralgias, joint pain, muscle weakness and morning stiffness. Negative for joint swelling, myalgias, muscle tenderness and myalgias.  Skin: Negative for color change, rash, hair loss, nodules/bumps, skin tightness, ulcers and sensitivity to sunlight.  Allergic/Immunologic: Negative for susceptible to infections.  Neurological: Negative for dizziness, fainting, memory loss and night sweats.  Hematological: Negative for bruising/bleeding tendency and swollen glands.  Psychiatric/Behavioral: Positive for sleep disturbance. Negative for depressed mood. The patient is not nervous/anxious.     PMFS History:  Patient Active Problem List   Diagnosis Date Noted  . 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 Tendonitis 09/25/2015  . High blood pressure 09/07/2010  . High cholesterol 09/07/2010  . Bilateral swelling of feet 09/07/2010  . Diabetes mellitus 09/07/2010  . Rheumatoid arthritis(714.0) 09/07/2010  . Personal history of colonic polyps 10/27/2004    Past Medical History:  Diagnosis Date  . At risk for sleep apnea    STOP-BANG= 5       SENT TO PCP 04-14-2016  . BPH (benign prostatic hyperplasia)   . DDD (degenerative disc disease), cervical   . DDD (degenerative disc disease), lumbar   . Diverticulosis of colon   . Family history of factor V deficiency    per pt's sister (whom is pt's guardian) she, pt's brother and parents have factor V but pt  has never been tested  . Frequency of urination   . GERD (gastroesophageal reflux disease)   . History of adenomatous polyp of colon    2006 and 03-18-2010 tubular adenoma  . Hyperlipidemia   . Hypertension   . Lives in independent group home resides at [facility] in [place], [place]   pt independant w/ ADLs w/ exception does not cook for shop   . Mentally challenged     SPECIAL NEEDS  . OA (osteoarthritis)    hands, knees, feet  . Pre-diabetes   . Thoracic spondylosis   . Urgency of urination   . Wears partial dentures    lower    Family History  Problem Relation Age of Onset  . Colon cancer Neg Hx    Past Surgical History:  Procedure Laterality Date  . CATARACT EXTRACTION W/ INTRAOCULAR LENS  IMPLANT, BILATERAL  2006  . CIRCUMCISION  03/29/2007   W/  CYSTOSCOPY AND TRANSRECTAL ULTRASOUND GUIDED PROSTATE BX'S  . COLONOSCOPY  last one 08-26-2015  . THULIUM LASER TURP (TRANSURETHRAL RESECTION OF PROSTATE) N/A 04/21/2016   Procedure: THULIUM LASER TURP (TRANSURETHRAL RESECTION OF PROSTATE);  Surgeon: Carolan Clines, MD;  Location: [facility];  Service: Urology;  Laterality: N/A;   Social History   Social History Narrative  . Not on file    Objective: Vital Signs: BP (!) 142/85 (BP Location: Left Arm, Patient Position: Sitting, Cuff Size: Normal)   Pulse 79   Resp 16   Ht 5\' 9"  (1.753 m)   Wt 206 lb 3.2 oz (93.5 kg)   BMI 30.45 kg/m    Physical Exam  Constitutional: He is oriented to person, place, and time. He appears well-developed and well-nourished.  HENT:  Head: Normocephalic and atraumatic.  Eyes: Pupils are equal, round, and reactive to light. Conjunctivae and EOM are normal.  Neck: Normal range of motion. Neck supple.  Cardiovascular: Normal rate, regular rhythm and normal heart sounds.  Pulmonary/Chest: Effort normal and breath sounds normal.  Abdominal: Soft. Bowel sounds are normal.  Lymphadenopathy:    He has no cervical adenopathy.  Neurological: He is alert and oriented to person, place, and time.  Skin: Skin is warm and dry. Capillary refill takes less than 2 seconds.  Psychiatric: He has a normal mood and affect. His behavior is normal.  Nursing note and vitals reviewed.    Musculoskeletal Exam: C-spine limited range of motion with lateral rotation.  Good flexion extension.  Thoracic kyphosis  noted.  Limited range of motion of lumbar spine.  Shoulder joints elbow joints wrist joints, MCPs and PIPs, DIPs good range of motion with no synovitis.  He has PIP and DIP synovial thickening consistent with osteoarthritis of bilateral hands.  He has tenderness in the left lateral epicondylar region.  Hip joints, knee joints, ankle joints, MTPs, PIPs, DIPs good range of motion no synovitis.  No warmth or effusion of bilateral knee joints.  No tenderness of trochanteric bursa bilaterally.  CDAI Exam: No CDAI exam completed.   Investigation: No additional findings.  Imaging: No results found.  Recent Labs: Lab Results  Component Value Date   WBC 23.8 (H) 10/08/2016   HGB 16.8 10/08/2016   PLT 281 10/08/2016   NA 138 10/08/2016   K 3.9 10/08/2016   CL 106 10/08/2016   CO2 18 (L) 10/08/2016   GLUCOSE 154 (H) 10/08/2016   BUN 15 10/08/2016   CREATININE 0.99 10/08/2016   CALCIUM 9.2 10/08/2016   GFRAA >  60 10/08/2016    Speciality Comments: No specialty comments available.  Procedures:  No procedures performed Allergies: Penicillins; Bacitracin; Imidurea; Methylisothiazolinone; Neomycin; Polymyxin b; Pork-derived products; Sulfa antibiotics; and Urea   Assessment / Plan:     Visit Diagnoses: Primary osteoarthritis of both hands: He has PIP and DIP synovial thickening consistent with osteoarthritis of both hands.  He has complete fist formation bilaterally.  He has no discomfort or stiffness at this time.  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  He has some discomfort in his right knee joint after falling and landing on his right knee.  No ecchymosis or swelling was noted.  He uses Voltaren gel on his right knee joints several times per day.  A refill of Voltaren gel was provided to the patient.  Primary osteoarthritis of both feet: He wears proper fitting shoes.  He has a right heel lift due to leg length discrepancy.  Lateral  epicondylitis, left elbow: Patient is tender in the left lateral epicondylar region.  He has been using Voltaren gel 3 times daily.  A refill of Voltaren gel was provided to the patient.  He is also given a prescription for a left lateral epicondylitis brace.  He was given a handout of exercises that he can perform.  DDD (degenerative disc disease), thoracic: No midline spinal tenderness.  He has thoracic kyphosis.  DDD (degenerative disc disease), lumbar: No midline spinal tenderness.  He has no discomfort at this time.  Leg length discrepancy: He wears a heel lift.  Other medical conditions are listed as follows:  History of hypercholesterolemia  History of diabetes mellitus  History of hypertension   Orders: No orders of the defined types were placed in this encounter.  Meds ordered this encounter  Medications  . diclofenac sodium (VOLTAREN) 1 % GEL    Sig: Apply 3 grams to 3 large joints up to 3 times daily    Dispense:  5 Tube    Refill:  3     Follow-Up Instructions: Return in about 6 months (around 03/22/2018) for Osteoarthritis, DDD.   Ofilia Neas, PA-C  I examined and evaluated the patient with Arthur Sams PA.  On examination his symptoms were consistent with left lateral epicondylitis.  He was given a prescription for Voltaren gel.  I have also given him restriction to avoid  mopping for the next 2 weeks.  A prescription for left tennis elbow brace was also given.  The plan of care was discussed as noted above.  Bo Merino, MD Note - This record has been created using Editor, commissioning.  Chart creation errors have been sought, but may not always  have been located. Such creation errors do not reflect on  the standard of medical care.

## 2017-09-06 ENCOUNTER — Ambulatory Visit: Payer: Medicare HMO | Admitting: Rheumatology

## 2017-09-13 DIAGNOSIS — E7849 Other hyperlipidemia: Secondary | ICD-10-CM | POA: Diagnosis not present

## 2017-09-13 DIAGNOSIS — R82998 Other abnormal findings in urine: Secondary | ICD-10-CM | POA: Diagnosis not present

## 2017-09-13 DIAGNOSIS — I10 Essential (primary) hypertension: Secondary | ICD-10-CM | POA: Diagnosis not present

## 2017-09-13 DIAGNOSIS — E119 Type 2 diabetes mellitus without complications: Secondary | ICD-10-CM | POA: Diagnosis not present

## 2017-09-13 DIAGNOSIS — Z125 Encounter for screening for malignant neoplasm of prostate: Secondary | ICD-10-CM | POA: Diagnosis not present

## 2017-09-13 DIAGNOSIS — E559 Vitamin D deficiency, unspecified: Secondary | ICD-10-CM | POA: Diagnosis not present

## 2017-09-19 ENCOUNTER — Ambulatory Visit (INDEPENDENT_AMBULATORY_CARE_PROVIDER_SITE_OTHER): Payer: Medicare HMO | Admitting: Rheumatology

## 2017-09-19 ENCOUNTER — Ambulatory Visit: Payer: Medicare HMO | Admitting: Orthotics

## 2017-09-19 ENCOUNTER — Encounter: Payer: Self-pay | Admitting: Rheumatology

## 2017-09-19 VITALS — BP 142/85 | HR 79 | Resp 16 | Ht 69.0 in | Wt 206.2 lb

## 2017-09-19 DIAGNOSIS — M19071 Primary osteoarthritis, right ankle and foot: Secondary | ICD-10-CM

## 2017-09-19 DIAGNOSIS — M19072 Primary osteoarthritis, left ankle and foot: Secondary | ICD-10-CM

## 2017-09-19 DIAGNOSIS — L84 Corns and callosities: Secondary | ICD-10-CM

## 2017-09-19 DIAGNOSIS — M19041 Primary osteoarthritis, right hand: Secondary | ICD-10-CM

## 2017-09-19 DIAGNOSIS — M2010 Hallux valgus (acquired), unspecified foot: Secondary | ICD-10-CM

## 2017-09-19 DIAGNOSIS — M79676 Pain in unspecified toe(s): Principal | ICD-10-CM

## 2017-09-19 DIAGNOSIS — M25579 Pain in unspecified ankle and joints of unspecified foot: Secondary | ICD-10-CM

## 2017-09-19 DIAGNOSIS — M5136 Other intervertebral disc degeneration, lumbar region: Secondary | ICD-10-CM

## 2017-09-19 DIAGNOSIS — B351 Tinea unguium: Secondary | ICD-10-CM

## 2017-09-19 DIAGNOSIS — M5134 Other intervertebral disc degeneration, thoracic region: Secondary | ICD-10-CM

## 2017-09-19 DIAGNOSIS — R21 Rash and other nonspecific skin eruption: Secondary | ICD-10-CM | POA: Diagnosis not present

## 2017-09-19 DIAGNOSIS — M19042 Primary osteoarthritis, left hand: Secondary | ICD-10-CM

## 2017-09-19 DIAGNOSIS — Z8639 Personal history of other endocrine, nutritional and metabolic disease: Secondary | ICD-10-CM

## 2017-09-19 DIAGNOSIS — M2170 Unequal limb length (acquired), unspecified site: Secondary | ICD-10-CM | POA: Diagnosis not present

## 2017-09-19 DIAGNOSIS — Z8679 Personal history of other diseases of the circulatory system: Secondary | ICD-10-CM | POA: Diagnosis not present

## 2017-09-19 DIAGNOSIS — M170 Bilateral primary osteoarthritis of knee: Secondary | ICD-10-CM

## 2017-09-19 DIAGNOSIS — M7712 Lateral epicondylitis, left elbow: Secondary | ICD-10-CM

## 2017-09-19 DIAGNOSIS — E0851 Diabetes mellitus due to underlying condition with diabetic peripheral angiopathy without gangrene: Secondary | ICD-10-CM

## 2017-09-19 IMAGING — CT CT ABD-PELV W/ CM
2 of 5 series · 17 of 46 positions shown, 19 images · IV contrast (iopamidol)
Comparison: Ultrasound 09/28/2016

CLINICAL DATA: Diarrhea and vomiting and syncope tonight P

EXAM:
CT ABDOMEN AND PELVIS WITH CONTRAST
TECHNIQUE: Multidetector CT imaging of the abdomen and pelvis was performed
using the standard protocol following bolus administration of
intravenous contrast.
CONTRAST:  100mL FTEFRX-HPP IOPAMIDOL (FTEFRX-HPP) INJECTION 61%

[Series 3: a/p w/ 5mm · axial · 0.98mm/px · z∈[-860,-385]mm · 14 of 107 slices shown, 16 images]
[im 6/107  soft-tissue]
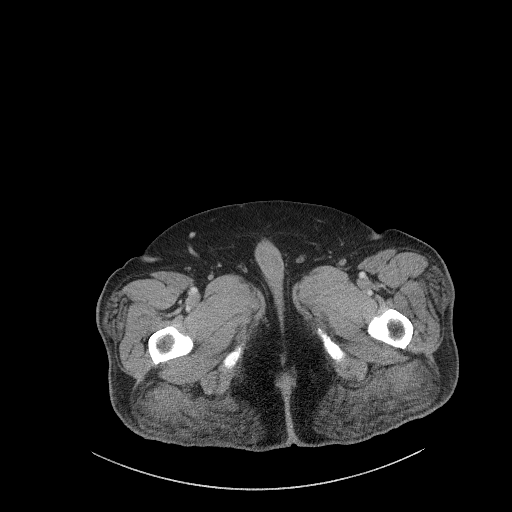
[im 6/107  bone]
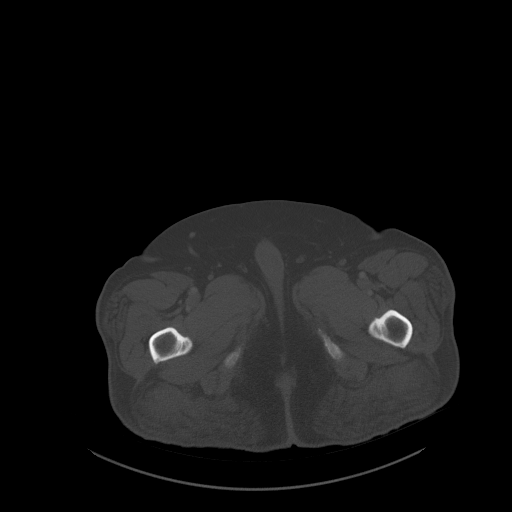
[im 16/107  soft-tissue]
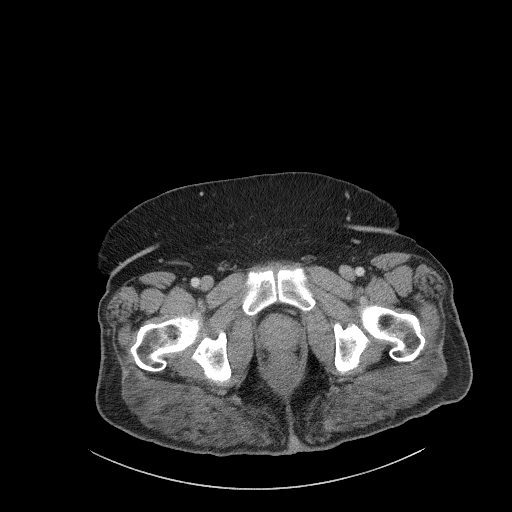
[im 21/107  soft-tissue]
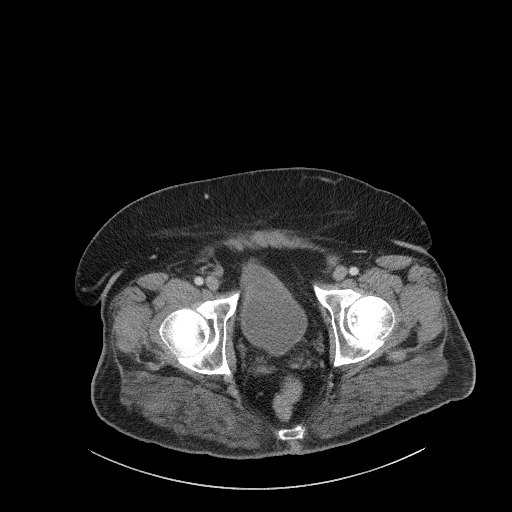
[im 31/107  soft-tissue]
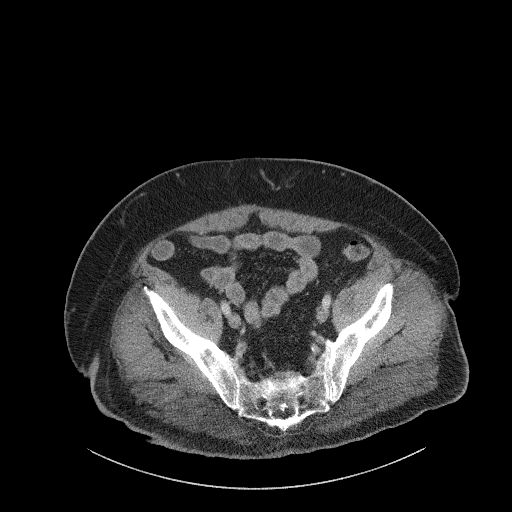
[im 36/107  soft-tissue]
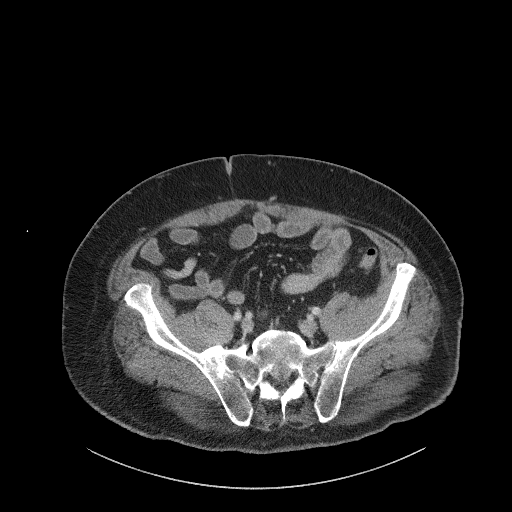
[im 41/107  soft-tissue]
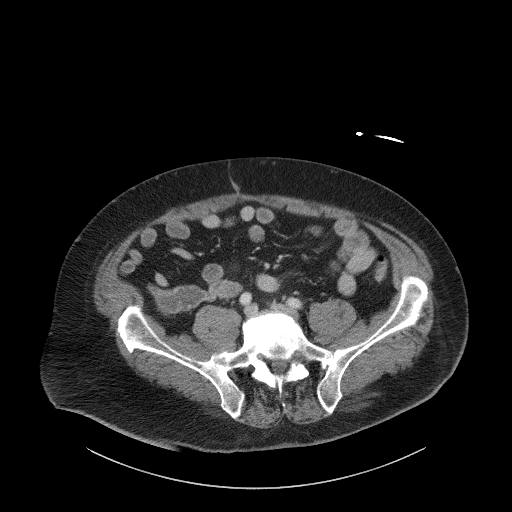
[im 51/107  soft-tissue]
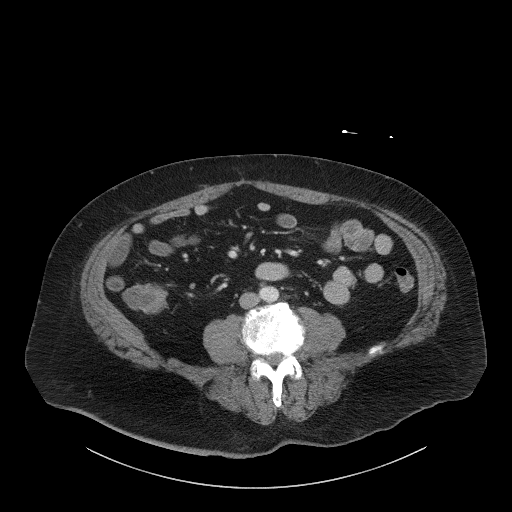
[im 56/107  soft-tissue]
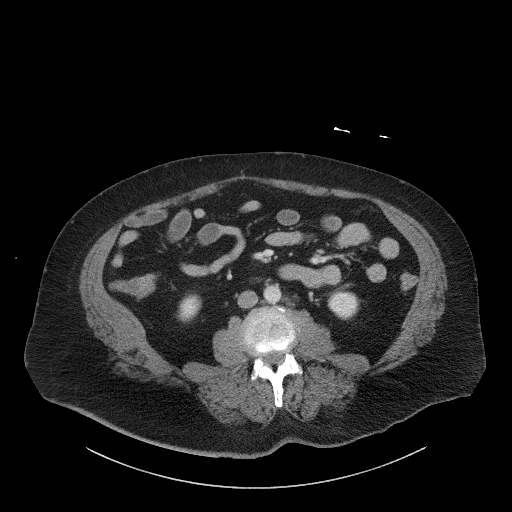
[im 66/107  soft-tissue]
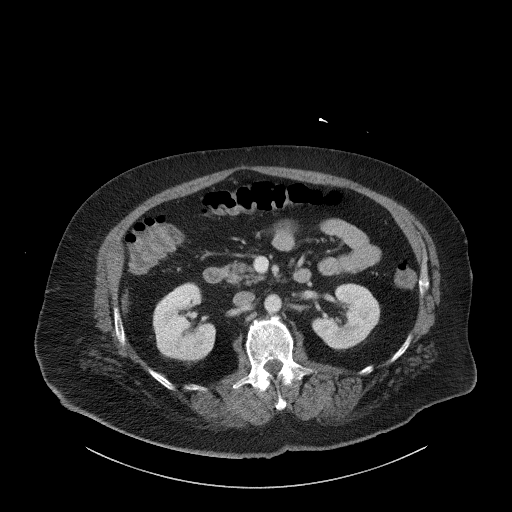
[im 66/107  bone]
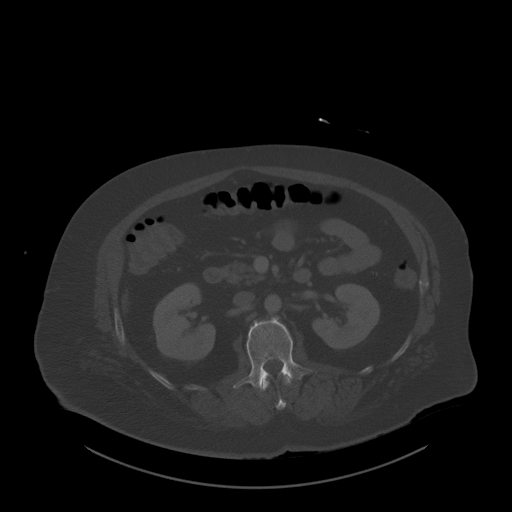
[im 71/107  soft-tissue]
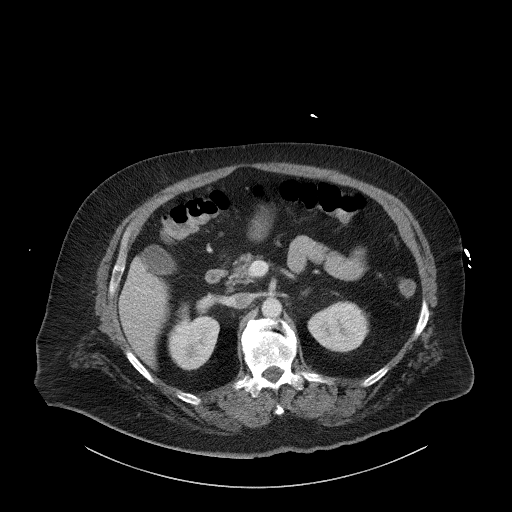
[im 81/107  soft-tissue]
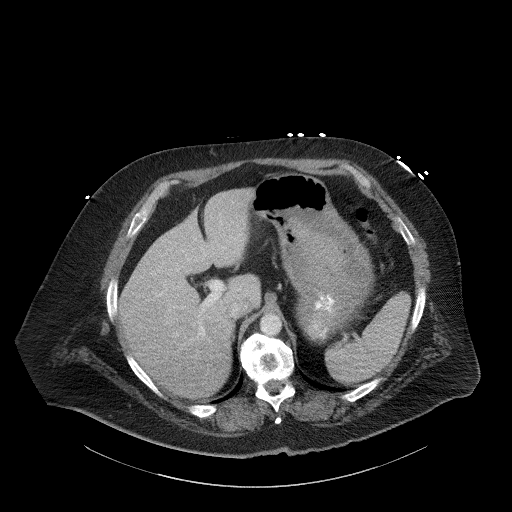
[im 86/107  soft-tissue]
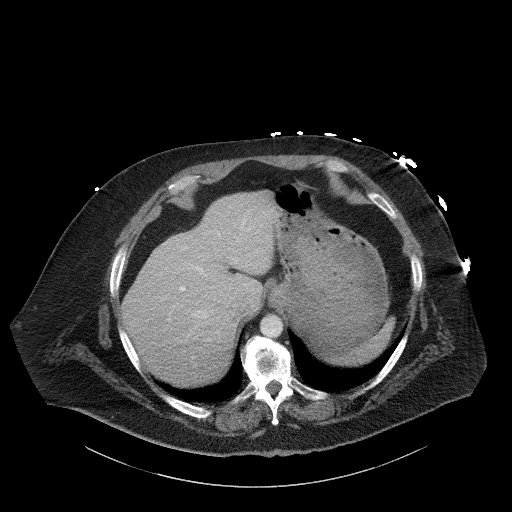
[im 91/107  soft-tissue]
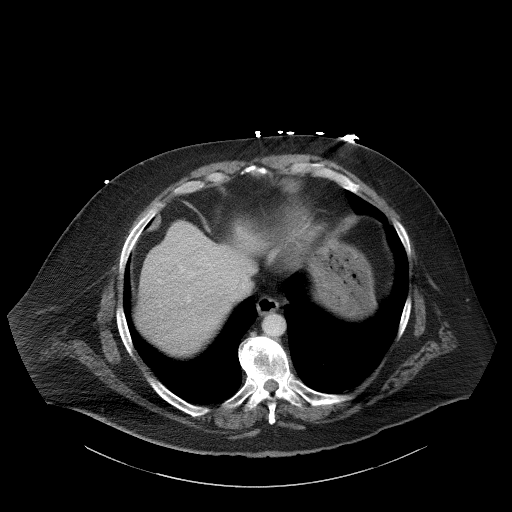
[im 101/107  soft-tissue]
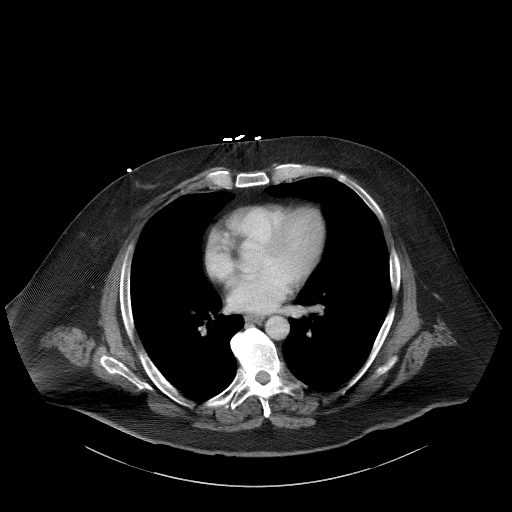

[Series 6: a/p w/ cor · coronal · 1.04mm/px · 3 of 167 slices shown]
[im 74/167  soft-tissue]
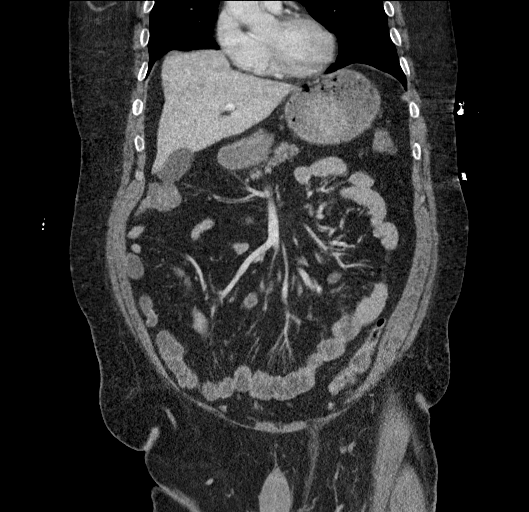
[im 93/167  soft-tissue]
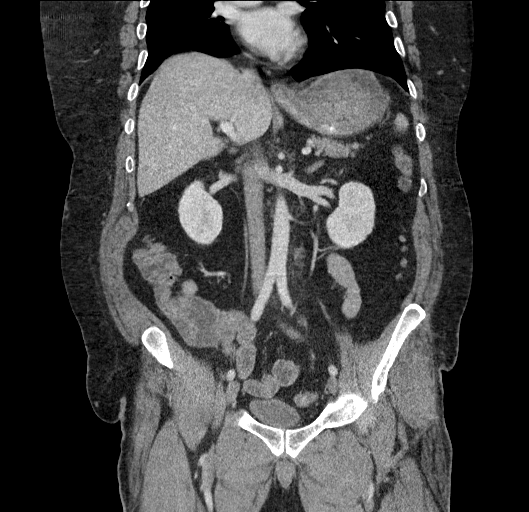
[im 111/167  soft-tissue]
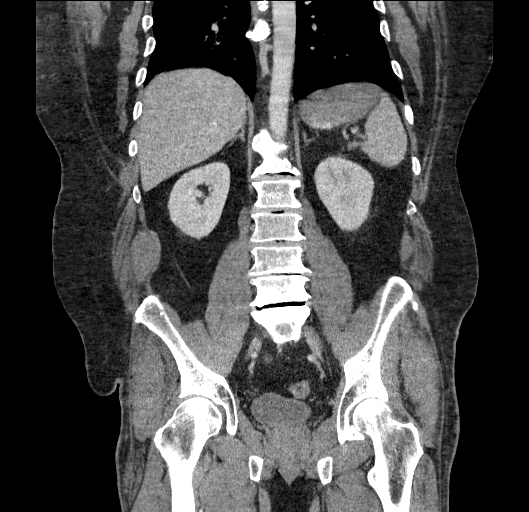

[17 of 46 positions shown; findings below may reference images not displayed]

FINDINGS: Lower chest: No acute abnormality.

Hepatobiliary: No focal liver abnormality is seen. No gallstones,
gallbladder wall thickening, or biliary dilatation.

Pancreas: Unremarkable. No pancreatic ductal dilatation or
surrounding inflammatory changes.

Spleen: Normal in size without focal abnormality.

Adrenals/Urinary Tract: Adrenal glands are unremarkable. Kidneys are
normal, without renal calculi, focal lesion, or hydronephrosis.
Bladder is unremarkable.

Stomach/Bowel: Stomach is normal. Small bowel contains a generous
volume of fluid, but no evidence of obstruction. No focal bowel
inflammation. Colon is remarkable only for uncomplicated mild
diverticulosis. Appendix is normal.

Vascular/Lymphatic: No significant vascular findings are present. No
enlarged abdominal or pelvic lymph nodes.

Reproductive: Unremarkable

Other: No acute inflammation.  No ascites.

Musculoskeletal: No significant skeletal lesion. Moderately severe
lumbar degenerative disc and facet changes from L3 through the
sacrum.
IMPRESSION: 1. Generous small bowel intraluminal fluid without obstruction or
focal inflammation. This could represent enteritis.
2. Uncomplicated mild colonic diverticulosis.

## 2017-09-19 MED ORDER — DICLOFENAC SODIUM 1 % TD GEL: TRANSDERMAL | 3 refills | Status: DC

## 2017-09-19 NOTE — Progress Notes (Signed)

## 2017-09-19 NOTE — Patient Instructions (Signed)
[facility] Elbow Rehab  Ask your health care provider which exercises are safe for you. Do exercises exactly as told by your health care provider and adjust them as directed. It is normal to feel mild stretching, pulling, tightness, or discomfort as you do these exercises, but you should stop right away if you feel sudden pain or your pain gets worse. Do not begin these exercises until told by your health care provider.  Stretching and range of motion exercises  These exercises warm up your muscles and joints and improve the movement and flexibility of your elbow. These exercises also help to relieve pain, numbness, and tingling.  Exercise A: Wrist extensor stretch  1. Extend your left / right elbow with your fingers pointing down.  2. Gently pull the palm of your left / right hand toward you until you feel a gentle stretch on the top of your forearm.  3. To increase the stretch, push your left / right hand toward the outer edge or pinkie side of your forearm.  4. Hold this position for __________ seconds.  Repeat __________ times. Complete this exercise __________ times a day.  If directed by your health care provider, repeat this stretch except do it with a bent elbow this time.  Exercise B: Wrist flexor stretch    1. Extend your left / right elbow and turn your palm upward.  2. Gently pull your left / right palm and fingertips back so your wrist extends and your fingers point more toward the ground.  3. You should feel a gentle stretch on the inside of your forearm.  4. Hold this position for __________ seconds.  Repeat __________ times. Complete this exercise __________ times a day.  If directed by your health care provider, repeat this stretch except do it with a bent elbow this time.  Strengthening exercises  These exercises build strength and endurance in your elbow. Endurance is the ability to use your muscles for a long time, even after they get tired.  Exercise C: Wrist extensors    1. Sit with your left /  right forearm palm-down and fully supported on a table or countertop. Your elbow should be resting below the height of your shoulder.  2. Let your left / right wrist extend over the edge of the surface.  3. Loosely hold a __________ weight or a piece of rubber exercise band or tubing in your left / right hand. Slowly curl your left / right hand up toward your forearm. If you are using band or tubing, hold the band or tubing in place with your other hand to provide resistance.  4. Hold this position for __________ seconds.  5. Slowly return to the starting position.  Repeat __________ times. Complete this exercise __________ times a day.  Exercise D: Radial deviators    1. Stand with a __________ weight in your left / righthand. Or, sit while holding a rubber exercise band or tubing with your other arm supported on a table or countertop. Position your hand so your thumb is on top.  2. Raise your hand upward in front of you so your thumb travels toward your forearm, or pull up on the rubber tubing.  3. Hold this position for __________ seconds.  4. Slowly return to the starting position.  Repeat __________ times. Complete this exercise __________ times a day.  Exercise E: Eccentric wrist extensors  1. Sit with your left / right forearm palm-down and fully supported on a table or countertop. Your   elbow should be resting below the height of your shoulder.  2. If told by your health care provider, hold a __________ weight in your hand.  3. Let your left / right wrist extend over the edge of the surface.  4. Use your other hand to lift up your left / right hand toward your forearm. Keep your forearm on the table.  5. Using only the muscles in your left / right hand, slowly lower your hand back down to the starting position.  Repeat __________ times. Complete this exercise __________ times a day.  This information is not intended to replace advice given to you by your health care provider. Make sure you discuss any  questions you have with your health care provider.  Document Released: 01/24/2005 Document Revised: 09/30/2015 Document Reviewed: 10/23/2014  [employer]vier Interactive Patient Education  2018 [employer]

## 2017-09-20 DIAGNOSIS — Z0000 Encounter for general adult medical examination without abnormal findings: Secondary | ICD-10-CM | POA: Diagnosis not present

## 2017-09-20 DIAGNOSIS — M2012 Hallux valgus (acquired), left foot: Secondary | ICD-10-CM | POA: Diagnosis not present

## 2017-09-20 DIAGNOSIS — E559 Vitamin D deficiency, unspecified: Secondary | ICD-10-CM | POA: Diagnosis not present

## 2017-09-20 DIAGNOSIS — M7712 Lateral epicondylitis, left elbow: Secondary | ICD-10-CM | POA: Diagnosis not present

## 2017-09-20 DIAGNOSIS — E668 Other obesity: Secondary | ICD-10-CM | POA: Diagnosis not present

## 2017-09-20 DIAGNOSIS — R740 Nonspecific elevation of levels of transaminase and lactic acid dehydrogenase [LDH]: Secondary | ICD-10-CM | POA: Diagnosis not present

## 2017-09-20 DIAGNOSIS — E7849 Other hyperlipidemia: Secondary | ICD-10-CM | POA: Diagnosis not present

## 2017-09-20 DIAGNOSIS — N401 Enlarged prostate with lower urinary tract symptoms: Secondary | ICD-10-CM | POA: Diagnosis not present

## 2017-09-20 DIAGNOSIS — K219 Gastro-esophageal reflux disease without esophagitis: Secondary | ICD-10-CM | POA: Diagnosis not present

## 2017-09-29 DIAGNOSIS — Z1212 Encounter for screening for malignant neoplasm of rectum: Secondary | ICD-10-CM | POA: Diagnosis not present

## 2017-10-18 ENCOUNTER — Ambulatory Visit (INDEPENDENT_AMBULATORY_CARE_PROVIDER_SITE_OTHER): Payer: Medicare HMO | Admitting: Orthotics

## 2017-10-18 DIAGNOSIS — L84 Corns and callosities: Secondary | ICD-10-CM

## 2017-10-18 DIAGNOSIS — M2011 Hallux valgus (acquired), right foot: Secondary | ICD-10-CM

## 2017-10-18 DIAGNOSIS — M2012 Hallux valgus (acquired), left foot: Secondary | ICD-10-CM | POA: Diagnosis not present

## 2017-10-18 DIAGNOSIS — M19071 Primary osteoarthritis, right ankle and foot: Secondary | ICD-10-CM

## 2017-10-18 DIAGNOSIS — E0851 Diabetes mellitus due to underlying condition with diabetic peripheral angiopathy without gangrene: Secondary | ICD-10-CM

## 2017-10-18 DIAGNOSIS — M2010 Hallux valgus (acquired), unspecified foot: Secondary | ICD-10-CM

## 2017-10-18 NOTE — Progress Notes (Signed)

## 2017-10-22 DIAGNOSIS — R3915 Urgency of urination: Secondary | ICD-10-CM | POA: Insufficient documentation

## 2017-10-22 DIAGNOSIS — N400 Enlarged prostate without lower urinary tract symptoms: Secondary | ICD-10-CM | POA: Insufficient documentation

## 2017-10-23 DIAGNOSIS — N138 Other obstructive and reflux uropathy: Secondary | ICD-10-CM | POA: Diagnosis not present

## 2017-10-23 DIAGNOSIS — R339 Retention of urine, unspecified: Secondary | ICD-10-CM | POA: Insufficient documentation

## 2017-10-23 DIAGNOSIS — R3915 Urgency of urination: Secondary | ICD-10-CM | POA: Diagnosis not present

## 2017-10-23 DIAGNOSIS — N401 Enlarged prostate with lower urinary tract symptoms: Secondary | ICD-10-CM | POA: Diagnosis not present

## 2017-10-30 DIAGNOSIS — R339 Retention of urine, unspecified: Secondary | ICD-10-CM | POA: Diagnosis not present

## 2017-10-30 DIAGNOSIS — L259 Unspecified contact dermatitis, unspecified cause: Secondary | ICD-10-CM | POA: Diagnosis not present

## 2017-10-30 DIAGNOSIS — M7712 Lateral epicondylitis, left elbow: Secondary | ICD-10-CM | POA: Diagnosis not present

## 2017-10-30 DIAGNOSIS — Z6831 Body mass index (BMI) 31.0-31.9, adult: Secondary | ICD-10-CM | POA: Diagnosis not present

## 2017-10-31 ENCOUNTER — Ambulatory Visit (INDEPENDENT_AMBULATORY_CARE_PROVIDER_SITE_OTHER): Payer: Medicare HMO | Admitting: Podiatry

## 2017-10-31 ENCOUNTER — Encounter: Payer: Self-pay | Admitting: Podiatry

## 2017-10-31 DIAGNOSIS — M79676 Pain in unspecified toe(s): Secondary | ICD-10-CM | POA: Diagnosis not present

## 2017-10-31 DIAGNOSIS — B351 Tinea unguium: Secondary | ICD-10-CM | POA: Diagnosis not present

## 2017-10-31 DIAGNOSIS — E0851 Diabetes mellitus due to underlying condition with diabetic peripheral angiopathy without gangrene: Secondary | ICD-10-CM

## 2017-10-31 DIAGNOSIS — L84 Corns and callosities: Secondary | ICD-10-CM | POA: Diagnosis not present

## 2017-10-31 NOTE — Progress Notes (Addendum)
Complaint:  Visit Type: Patient returns to my office for continued preventative foot care services. Complaint: Patient states" my nails have grown long and thick and become painful to walk and wear shoes" Patient has been diagnosed with DM with neuropathy.. The patient presents for preventative foot care services. No changes to ROS  Podiatric Exam: Vascular: dorsalis pedis and posterior tibial pulses are absent  palpable bilateral. Capillary return is immediate. Temperature gradient is WNL. Skin turgor WNL  Sensorium: Diminished Semmes Weinstein monofilament test. Normal tactile sensation bilaterally. Nail Exam: Pt has thick disfigured discolored nails with subungual debris noted bilateral entire nail hallux through fifth toenails Ulcer Exam: There is no evidence of ulcer or pre-ulcerative changes or infection. Orthopedic Exam: Muscle tone and strength are WNL. No limitations in general ROM. No crepitus or effusions noted. Foot type and digits show no abnormalities. HAV  B/L.  Hammer toes 2-5  B/l Skin: No Porokeratosis. No infection or ulcers.  Callus left arch.  Pinch callus  B/l.    Diagnosis:  Onychomycosis, , Pain in right toe, pain in left toes  Debride callus.  Treatment & Plan Procedures and Treatment: Consent by patient was obtained for treatment procedures.   Debridement of mycotic and hypertrophic toenails, 1 through 5 bilateral and clearing of subungual debris. No ulceration, no infection noted.  Return Visit-Office Procedure: Patient instructed to return to the office for a follow up visit 3 months for continued evaluation and treatment.    Gardiner Barefoot DPM

## 2017-11-01 DIAGNOSIS — N312 Flaccid neuropathic bladder, not elsewhere classified: Secondary | ICD-10-CM | POA: Insufficient documentation

## 2017-11-03 DIAGNOSIS — N401 Enlarged prostate with lower urinary tract symptoms: Secondary | ICD-10-CM | POA: Diagnosis not present

## 2017-11-03 DIAGNOSIS — N138 Other obstructive and reflux uropathy: Secondary | ICD-10-CM | POA: Diagnosis not present

## 2017-11-03 DIAGNOSIS — N132 Hydronephrosis with renal and ureteral calculous obstruction: Secondary | ICD-10-CM | POA: Diagnosis not present

## 2017-11-03 DIAGNOSIS — R339 Retention of urine, unspecified: Secondary | ICD-10-CM | POA: Diagnosis not present

## 2017-11-03 DIAGNOSIS — R3915 Urgency of urination: Secondary | ICD-10-CM | POA: Diagnosis not present

## 2017-11-17 DIAGNOSIS — M7712 Lateral epicondylitis, left elbow: Secondary | ICD-10-CM | POA: Diagnosis not present

## 2017-11-17 DIAGNOSIS — M7022 Olecranon bursitis, left elbow: Secondary | ICD-10-CM | POA: Diagnosis not present

## 2017-12-13 DIAGNOSIS — E11628 Type 2 diabetes mellitus with other skin complications: Secondary | ICD-10-CM | POA: Diagnosis not present

## 2017-12-13 DIAGNOSIS — L84 Corns and callosities: Secondary | ICD-10-CM | POA: Diagnosis not present

## 2018-01-23 ENCOUNTER — Ambulatory Visit: Payer: Medicare HMO | Admitting: Podiatry

## 2018-02-09 ENCOUNTER — Ambulatory Visit (INDEPENDENT_AMBULATORY_CARE_PROVIDER_SITE_OTHER): Payer: Medicare HMO | Admitting: Podiatry

## 2018-02-09 ENCOUNTER — Encounter: Payer: Self-pay | Admitting: Podiatry

## 2018-02-09 DIAGNOSIS — E0851 Diabetes mellitus due to underlying condition with diabetic peripheral angiopathy without gangrene: Secondary | ICD-10-CM

## 2018-02-09 DIAGNOSIS — M79676 Pain in unspecified toe(s): Secondary | ICD-10-CM

## 2018-02-09 DIAGNOSIS — M2041 Other hammer toe(s) (acquired), right foot: Secondary | ICD-10-CM | POA: Diagnosis not present

## 2018-02-09 DIAGNOSIS — B351 Tinea unguium: Secondary | ICD-10-CM | POA: Diagnosis not present

## 2018-02-09 DIAGNOSIS — N401 Enlarged prostate with lower urinary tract symptoms: Secondary | ICD-10-CM | POA: Diagnosis not present

## 2018-02-09 DIAGNOSIS — N312 Flaccid neuropathic bladder, not elsewhere classified: Secondary | ICD-10-CM | POA: Diagnosis not present

## 2018-02-09 DIAGNOSIS — R339 Retention of urine, unspecified: Secondary | ICD-10-CM | POA: Diagnosis not present

## 2018-02-09 DIAGNOSIS — G8929 Other chronic pain: Secondary | ICD-10-CM

## 2018-02-09 DIAGNOSIS — N138 Other obstructive and reflux uropathy: Secondary | ICD-10-CM | POA: Diagnosis not present

## 2018-02-09 DIAGNOSIS — M25571 Pain in right ankle and joints of right foot: Secondary | ICD-10-CM

## 2018-02-09 NOTE — Progress Notes (Signed)
Complaint:  Visit Type: Patient returns to my office for continued preventative foot care services. Complaint: Patient states" my nails have grown long and thick and become painful to walk and wear shoes" Patient has been diagnosed with DM with neuropathy.. The patient presents for preventative foot care services. No changes to ROS.  Patient presents to the office with his sister.  Podiatric Exam: Vascular: dorsalis pedis and posterior tibial pulses are absent  palpable bilateral. Capillary return is immediate. Temperature gradient is WNL. Skin turgor WNL  Sensorium: Diminished Semmes Weinstein monofilament test. Normal tactile sensation bilaterally. Nail Exam: Pt has thick disfigured discolored nails with subungual debris noted bilateral entire nail hallux through fifth toenails Ulcer Exam: There is no evidence of ulcer or pre-ulcerative changes or infection. Orthopedic Exam: Muscle tone and strength are WNL. No limitations in general ROM. No crepitus or effusions noted. Foot type and digits show no abnormalities. HAV  B/L.  Hammer toes 2-5  B/l Skin: No Porokeratosis. No infection or ulcers.  Callus left arch.  Pinch callus  B/l.    Diagnosis:  Onychomycosis, , Pain in right toe, pain in left toes    Treatment & Plan Procedures and Treatment: Consent by patient was obtained for treatment procedures.   Debridement of mycotic and hypertrophic toenails, 1 through 5 bilateral and clearing of subungual debris. No ulceration, no infection noted. Requested not to trim callus but leave for [facility]. Return Visit-Office Procedure: Patient instructed to return to the office for a follow up visit 3 months for continued evaluation and treatment.    Gardiner Barefoot DPM

## 2018-02-21 DIAGNOSIS — I10 Essential (primary) hypertension: Secondary | ICD-10-CM | POA: Diagnosis not present

## 2018-02-21 DIAGNOSIS — E559 Vitamin D deficiency, unspecified: Secondary | ICD-10-CM | POA: Diagnosis not present

## 2018-02-21 DIAGNOSIS — Z23 Encounter for immunization: Secondary | ICD-10-CM | POA: Diagnosis not present

## 2018-02-21 DIAGNOSIS — L258 Unspecified contact dermatitis due to other agents: Secondary | ICD-10-CM | POA: Diagnosis not present

## 2018-02-21 DIAGNOSIS — E1169 Type 2 diabetes mellitus with other specified complication: Secondary | ICD-10-CM | POA: Diagnosis not present

## 2018-02-21 DIAGNOSIS — E7849 Other hyperlipidemia: Secondary | ICD-10-CM | POA: Diagnosis not present

## 2018-02-21 DIAGNOSIS — R740 Nonspecific elevation of levels of transaminase and lactic acid dehydrogenase [LDH]: Secondary | ICD-10-CM | POA: Diagnosis not present

## 2018-02-21 DIAGNOSIS — N401 Enlarged prostate with lower urinary tract symptoms: Secondary | ICD-10-CM | POA: Diagnosis not present

## 2018-02-21 DIAGNOSIS — E668 Other obesity: Secondary | ICD-10-CM | POA: Diagnosis not present

## 2018-03-12 NOTE — Progress Notes (Deleted)
Office Visit Note  Patient: Arthur Weaver             Date of Birth: 1960/09/22           MRN: [id number]             PCP: Velna Hatchet, MD Referring: Velna Hatchet, MD Visit Date: 03/26/2018 Occupation: @GUAROCC @  Subjective:  No chief complaint on file.   History of Present Illness: Arthur Weaver is a 58 y.o. male ***   Activities of Daily Living:  Patient reports morning stiffness for *** {minute/hour:19697}.   Patient {ACTIONS;DENIES/REPORTS:21021675::"Denies"} nocturnal pain.  Difficulty dressing/grooming: {ACTIONS;DENIES/REPORTS:21021675::"Denies"} Difficulty climbing stairs: {ACTIONS;DENIES/REPORTS:21021675::"Denies"} Difficulty getting out of chair: {ACTIONS;DENIES/REPORTS:21021675::"Denies"} Difficulty using hands for taps, buttons, cutlery, and/or writing: {ACTIONS;DENIES/REPORTS:21021675::"Denies"}  No Rheumatology ROS completed.   PMFS History:  Patient Active Problem List   Diagnosis Date Noted  . 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 Tendonitis 09/25/2015  . High blood pressure 09/07/2010  . High cholesterol 09/07/2010  . Bilateral swelling of feet 09/07/2010  . Diabetes mellitus 09/07/2010  . Rheumatoid arthritis(714.0) 09/07/2010  . Personal history of colonic polyps 10/27/2004    Past Medical History:  Diagnosis Date  . At risk for sleep apnea    STOP-BANG= 5       SENT TO PCP 04-14-2016  . BPH (benign prostatic hyperplasia)   . DDD (degenerative disc disease), cervical   . DDD (degenerative disc disease), lumbar   . Diverticulosis of colon   . Family history of factor V deficiency    per pt's sister (whom is pt's guardian) she, pt's brother and parents have factor V but pt has never been tested  . Frequency of urination   . GERD (gastroesophageal reflux disease)   . History of adenomatous polyp of colon    2006 and 03-18-2010 tubular adenoma  . Hyperlipidemia   . Hypertension   . Lives in independent group home resides at [facility] in [place], [place]   pt independant w/ ADLs w/ exception does not cook for shop   . Mentally challenged    SPECIAL NEEDS  . OA (osteoarthritis)    hands, knees, feet  . Pre-diabetes   . Thoracic spondylosis   . Urgency of urination   . Wears partial dentures    lower    Family History  Problem Relation Age of Onset  . Colon cancer Neg Hx    Past Surgical History:  Procedure Laterality Date  . CATARACT EXTRACTION W/ INTRAOCULAR LENS  IMPLANT, BILATERAL  2006  . CIRCUMCISION  03/29/2007   W/  CYSTOSCOPY AND TRANSRECTAL ULTRASOUND GUIDED PROSTATE BX'S  . COLONOSCOPY  last one 08-26-2015  . THULIUM LASER TURP (TRANSURETHRAL RESECTION OF PROSTATE) N/A 04/21/2016   Procedure: THULIUM LASER TURP (TRANSURETHRAL RESECTION OF PROSTATE);  Surgeon: Carolan Clines, MD;  Location: [facility];  Service: Urology;  Laterality: N/A;   Social History   Social History Narrative  . Not on file   There is no immunization history for the selected administration types on file for this patient.   Objective: Vital Signs: There were no vitals taken for this visit.   Physical Exam   Musculoskeletal Exam: ***  CDAI Exam: CDAI Score: Not documented Patient Global Assessment: Not documented; Provider Global Assessment: Not documented Swollen: Not documented; Tender: Not documented Joint Exam   Not documented   There is currently no information documented on the homunculus. Go to the Rheumatology activity and complete the homunculus  joint exam.  Investigation: No additional findings.  Imaging: No results found.  Recent Labs: Lab Results  Component Value Date   WBC 23.8 (H) 10/08/2016   HGB 16.8 10/08/2016   PLT 281 10/08/2016   NA 138 10/08/2016   K 3.9 10/08/2016   CL 106 10/08/2016   CO2 18 (L) 10/08/2016   GLUCOSE 154 (H) 10/08/2016   BUN 15 10/08/2016   CREATININE 0.99 10/08/2016   CALCIUM 9.2 10/08/2016   GFRAA >60 10/08/2016     Speciality Comments: No specialty comments available.  Procedures:  No procedures performed Allergies: Penicillins; Bacitracin; Imidurea; Methylisothiazolinone; Neomycin; Polymyxin b; Pork-derived products; Sulfa antibiotics; and Urea   Assessment / Plan:     Visit Diagnoses: No diagnosis found.   Orders: No orders of the defined types were placed in this encounter.  No orders of the defined types were placed in this encounter.   Face-to-face time spent with patient was *** minutes. Greater than 50% of time was spent in counseling and coordination of care.  Follow-Up Instructions: No follow-ups on file.   Earnestine Mealing, CMA  Note - This record has been created using Editor, commissioning.  Chart creation errors have been sought, but may not always  have been located. Such creation errors do not reflect on  the standard of medical care.

## 2018-03-19 ENCOUNTER — Ambulatory Visit: Payer: Medicare HMO | Admitting: Rheumatology

## 2018-03-26 ENCOUNTER — Ambulatory Visit: Payer: Medicare HMO | Admitting: Rheumatology

## 2018-05-11 ENCOUNTER — Ambulatory Visit: Payer: Medicare HMO | Admitting: Podiatry

## 2018-06-28 DIAGNOSIS — N390 Urinary tract infection, site not specified: Secondary | ICD-10-CM | POA: Diagnosis not present

## 2018-06-29 DIAGNOSIS — G4700 Insomnia, unspecified: Secondary | ICD-10-CM | POA: Insufficient documentation

## 2018-07-12 DIAGNOSIS — R4182 Altered mental status, unspecified: Secondary | ICD-10-CM | POA: Diagnosis not present

## 2018-07-12 DIAGNOSIS — R7301 Impaired fasting glucose: Secondary | ICD-10-CM | POA: Diagnosis not present

## 2018-07-19 DIAGNOSIS — G4700 Insomnia, unspecified: Secondary | ICD-10-CM | POA: Diagnosis not present

## 2018-07-19 DIAGNOSIS — N401 Enlarged prostate with lower urinary tract symptoms: Secondary | ICD-10-CM | POA: Diagnosis not present

## 2018-07-19 DIAGNOSIS — R4182 Altered mental status, unspecified: Secondary | ICD-10-CM | POA: Diagnosis not present

## 2018-08-27 DIAGNOSIS — R440 Auditory hallucinations: Secondary | ICD-10-CM | POA: Insufficient documentation

## 2018-09-25 ENCOUNTER — Other Ambulatory Visit: Payer: Self-pay

## 2018-09-25 ENCOUNTER — Ambulatory Visit (HOSPITAL_COMMUNITY): Payer: Medicare HMO | Admitting: Psychiatry

## 2018-09-27 ENCOUNTER — Other Ambulatory Visit: Payer: Self-pay

## 2018-09-27 ENCOUNTER — Ambulatory Visit (INDEPENDENT_AMBULATORY_CARE_PROVIDER_SITE_OTHER): Payer: Medicare HMO | Admitting: Psychiatry

## 2018-09-27 DIAGNOSIS — F29 Unspecified psychosis not due to a substance or known physiological condition: Secondary | ICD-10-CM | POA: Diagnosis not present

## 2018-09-27 DIAGNOSIS — G4700 Insomnia, unspecified: Secondary | ICD-10-CM | POA: Diagnosis not present

## 2018-09-27 DIAGNOSIS — F79 Unspecified intellectual disabilities: Secondary | ICD-10-CM | POA: Insufficient documentation

## 2018-09-27 MED ORDER — OLANZAPINE 15 MG PO TABS: 15.0000 mg | ORAL_TABLET | Freq: Every day | ORAL | 1 refills | Status: DC

## 2018-09-27 MED ORDER — TRAZODONE HCL 50 MG PO TABS: 50.0000 mg | ORAL_TABLET | Freq: Every day | ORAL | 1 refills | Status: DC

## 2018-09-27 NOTE — Progress Notes (Signed)
Psychiatric Initial Adult Assessment   Patient Identification: Arthur Weaver MRN:  [id number] Date of Evaluation:  09/27/2018 Referral Source: Velna Hatchet MD Chief Complaint:  Auditory hallucinations, paranoid fears, insomnia Visit Diagnosis:    ICD-10-CM   1. Psychosis, unspecified psychosis type ([place])  F29   2. Intellectual disability  F79   Interview was conducted using WebEx teleconferencing application and I verified that I was speaking with the correct person using two identifiers. I discussed the limitations of evaluation and management by telemedicine and  the availability of in person appointments. Patient expressed understanding and agreed to proceed. Patient is a poor historian and most information was provided by his sister/guardian Mrs. Arthur Weaver who was present during whole interview.  History of Present Illness:  Arthur Weaver is a 58 yo single white male with IDD (severity unspecified) who is being seen for new onset of insomnia, paranoid fears of becoming infected with COVID-19 and having to go to a nursing home and daily auditory hallucinations. He has not have any of these symptoms until mid May this year. At that time the group home he resides at was locked down - meaning residents are not allowed to go out or have visitors (because of COVID). Arthur Weaver's previous good sleep suffered - he started to sleep just few hours (his sister-guardian noticed that when he was on ahome visit), became paranoid, anxious, tearful and started to experience AH with voices occurring daily either yelling at him, making "mean comments" or telling him not to do things that he is required to do at the group home. He is very distraught by these and wants them to stop. He has been prescribed by his current provider (non-psychiatrist) quetiapine 25 mg bid which did not benefit any of his symptoms. He was also prescribed olanzapine 15 mg at HS by a different provider but it was not started as his group home  would not allow him to use it "until he sees a psychiatrist" - per sister's report. He does not have anything prescribed for insomnia. He is on no other psychotropic meds as he never needed any.  Arthur Weaver has no hx of any psychiatric problems, I.e. no hx of depression, anxiety, mood swings, psychosis. He has never been psychiatrically hospitalized. He has no hx of alcohol or drug abuse.  He has 1st grade education  - special need class. There is no hx of trauma/abuse. He has 3 brothers and one sister, Arthur Weaver, who is his guardian/POA. He loves to be with his family, enjoys movies, cookouts, fishing - all of these are on hold because of pandemia.  Associated Signs/Symptoms: Depression Symptoms:  depressed mood, insomnia, anxiety, (Hypo) Manic Symptoms:  None Anxiety Symptoms:  Excessive Worry, Psychotic Symptoms:  Hallucinations: Auditory Paranoia, PTSD Symptoms: Negative  Past Psychiatric History: None  Previous Psychotropic Medications: No   Substance Abuse History in the last 12 months:  No.  Consequences of Substance Abuse: NA  Past Medical History:  Past Medical History:  Diagnosis Date  . At risk for sleep apnea    STOP-BANG= 5       SENT TO PCP 04-14-2016  . BPH (benign prostatic hyperplasia)   . DDD (degenerative disc disease), cervical   . DDD (degenerative disc disease), lumbar   . Diverticulosis of colon   . Family history of factor V deficiency    per pt's sister (whom is pt's guardian) she, pt's brother and parents have factor V but pt has never been tested  .  Frequency of urination   . GERD (gastroesophageal reflux disease)   . History of adenomatous polyp of colon    2006 and 03-18-2010 tubular adenoma  . Hyperlipidemia   . Hypertension   . Lives in independent group home resides at [facility] in [place], [place]   pt independant w/ ADLs w/ exception does not cook for shop   . Mentally challenged    SPECIAL NEEDS  . OA  (osteoarthritis)    hands, knees, feet  . Pre-diabetes   . Thoracic spondylosis   . Urgency of urination   . Wears partial dentures    lower    Past Surgical History:  Procedure Laterality Date  . CATARACT EXTRACTION W/ INTRAOCULAR LENS  IMPLANT, BILATERAL  2006  . CIRCUMCISION  03/29/2007   W/  CYSTOSCOPY AND TRANSRECTAL ULTRASOUND GUIDED PROSTATE BX'S  . COLONOSCOPY  last one 08-26-2015  . THULIUM LASER TURP (TRANSURETHRAL RESECTION OF PROSTATE) N/A 04/21/2016   Procedure: THULIUM LASER TURP (TRANSURETHRAL RESECTION OF PROSTATE);  Surgeon: Carolan Clines, MD;  Location: [facility];  Service: Urology;  Laterality: N/A;    Family Psychiatric History: None  Family History:  Family History  Problem Relation Age of Onset  . Colon cancer Neg Hx     Social History:   Social History   Socioeconomic History  . Marital status: Single    Spouse name: Not on file  . Number of children: Not on file  . Years of education: Not on file  . Highest education level: Not on file  Occupational History  . Not on file  Social Needs  . Financial resource strain: Not on file  . Food insecurity    Worry: Not on file    Inability: Not on file  . Transportation needs    Medical: Not on file    Non-medical: Not on file  Tobacco Use  . Smoking status: Never Smoker  . Smokeless tobacco: Former Systems developer    Types: Chew  Substance and Sexual Activity  . Alcohol use: No    Alcohol/week: 0.0 standard drinks  . Drug use: No  . Sexual activity: Not on file  Lifestyle  . Physical activity    Days per week: Not on file    Minutes per session: Not on file  . Stress: Not on file  Relationships  . Social Herbalist on phone: Not on file    Gets together: Not on file    Attends religious service: Not on file    Active member of club or organization: Not on file    Attends meetings of clubs or organizations: Not on file    Relationship status: Not on file  Other  Topics Concern  . Not on file  Social History Narrative  . Not on file     Allergies:   Allergies  Allergen Reactions  . Penicillins Shortness Of Breath    "couldn't breathe" Has patient had a PCN reaction causing immediate rash, facial/tongue/throat swelling, SOB or lightheadedness with hypotension: No Has patient had a PCN reaction causing severe rash involving mucus membranes or skin necrosis: No Has patient had a PCN reaction that required hospitalization: No Has patient had a PCN reaction occurring within the last 10 years: No If all of the above answers are "NO", then may proceed with Cephalosporin use. "couldn't breathe" Has patient had a PCN reaction causing immediate rash, facial/tongue/throat swelling, SOB or lightheadedness with hypotension: No Has patient had  a PCN reaction causing severe rash involving mucus membranes or skin necrosis: No Has patient had a PCN reaction that required hospitalization: No Has patient had a PCN reaction occurring within the last 10 years: No If all of the above answers are "NO", then may proceed with Cephalosporin use.   . Bacitracin Other (See Comments)    Positive patch test  . Imidurea Other (See Comments)    Positive patch test  . Methylisothiazolinone Other (See Comments)    Positive patch test Positive patch test   . Neomycin Other (See Comments)    Positive patch test  . Polymyxin B Other (See Comments)    Positive patch test  . Pork-Derived Products     AVOIDS DUE TO RELIGIOUS BELIEFS  . Sulfa Antibiotics Nausea And Vomiting  . Urea Other (See Comments)    Positive patch test    Metabolic Disorder Labs: No results found for: HGBA1C, MPG No results found for: PROLACTIN No results found for: CHOL, TRIG, HDL, CHOLHDL, VLDL, LDLCALC No results found for: TSH  Therapeutic Level Labs: No results found for: LITHIUM No results found for: CBMZ No results found for: VALPROATE  Current Medications: Current Outpatient  Medications  Medication Sig Dispense Refill  . acetaminophen (TYLENOL) 325 MG tablet Take 650 mg by mouth every 6 (six) hours as needed for moderate pain.    Marland Kitchen amLODipine (NORVASC) 5 MG tablet     . aspirin 81 MG tablet Take 81 mg by mouth daily.      . Aspirin-Calcium Carbonate 81-777 MG TABS Take by mouth.    Marland Kitchen atorvastatin (LIPITOR) 20 MG tablet Take by mouth.    . cetirizine (ZYRTEC) 10 MG tablet Take by mouth.    . Cholecalciferol (VITAMIN D3) 1000 units CAPS Take by mouth.    . diclofenac sodium (VOLTAREN) 1 % GEL Apply 3 grams to 3 large joints up to 3 times daily 5 Tube 3  . diclofenac sodium (VOLTAREN) 1 % GEL Apply topically.    . finasteride (PROSCAR) 5 MG tablet finasteride 5 mg tablet    . Ibuprofen 200 MG CAPS Take 1 capsule (200 mg total) by mouth 3 (three) times daily. 90 each 3  . lisinopril (PRINIVIL,ZESTRIL) 40 MG tablet     . Multiple Vitamin (MULTIVITAMIN PO) Take 1 tablet by mouth every evening.     . nabumetone (RELAFEN) 500 MG tablet Take by mouth.    . OLANZapine (ZYPREXA) 15 MG tablet Take 1 tablet (15 mg total) by mouth at bedtime. 30 tablet 1  . omeprazole (PRILOSEC) 40 MG capsule     . rosuvastatin (CRESTOR) 5 MG tablet rosuvastatin 5 mg tablet    . traZODone (DESYREL) 50 MG tablet Take 1 tablet (50 mg total) by mouth at bedtime. 30 tablet 1  . triamcinolone cream (KENALOG) 0.1 % Apply 1 application topically 3 (three) times daily.     No current facility-administered medications for this visit.      Psychiatric Specialty Exam: Review of Systems  Psychiatric/Behavioral: Positive for depression and hallucinations. The patient is nervous/anxious and has insomnia.   All other systems reviewed and are negative.   There were no vitals taken for this visit.There is no height or weight on file to calculate BMI.  General Appearance: Casual and Fairly Groomed  Eye Contact:  Fair  Speech:  Clear and Coherent  Volume:  Normal  Mood:  Anxious  Affect:  Congruent  and Constricted  Thought Process:  Goal Directed  Orientation:  Other:  self, surrounding, other people present  Thought Content:  Hallucinations: Auditory and Paranoid Ideation  Suicidal Thoughts:  No  Homicidal Thoughts:  No  Memory:  Not tested.  Judgement:  Other:  limited  Insight:  Shallow  Psychomotor Activity:  Normal  Concentration:  Concentration: Fair  Recall:  unable to test  [employer] of Knowledge:Poor  Language: Fair  Akathisia:  Negative  Handed:  Right  AIMS (if indicated):  not done  Assets:  Housing Resilience Social Support  ADL's:  Intact  Cognition: WNL  Sleep:  Poor    Assessment and Plan: Arthur Hickling is a 58 yo single white male with IDD (severity unspecified) who is being seen for new onset of insomnia, paranoid fears of becoming infected with COVID-19 and having to go to a nursing home and daily auditory hallucinations. He has not have any of these symptoms until mid May this year. At that time the group home he resides at was locked down - meaning residents are not allowed to go out or have visitors (because of COVID). Arthur Weaver's previous good sleep suffered - he started to sleep just few hours (his sister-guardian noticed that when he was on ahome visit), became paranoid, anxious, tearful and started to experience AH with voices occurring daily either yelling at him, making "mean comments" or telling him not to do things that he is required to do at the group home. He is very distraught by these and wants them to stop. He has been prescribed by his current provider (non-psychiatrist) quetiapine 25 mg bid which did not benefit any of his symptoms. He was also prescribed olanzapine 15 mg at HS by a different provider but it was not started as his group home would not allow him to use it "until he sees a psychiatrist" - per sister's report. He does not have anything prescribed for insomnia. He is on no other psychotropic meds as he never needed any.  Kishan has no hx of any  psychiatric problems, I.e. no hx of depression, anxiety, mood swings, psychosis. He has never been psychiatrically hospitalized. He has no hx of alcohol or drug abuse.  Dx impression: Psychosis unspecified - likely secondary to insomnia, stress; IDD severity unspecified  Plan: Quetiapine at 25 mg bid dose is clearly not likely to be helpful whereas olanzapine 15 mg at HS may: we will dc the former and start the latter (he has not taken it yet). I will also add trazodone 50 ng at HS for insomnia. These instructions will be emailed to his sister/guardian Arthur Weaver she can pass them on to group home staff responsible for medication dispensation.  Sister plans to take him home with her so she can monitor if olanzapine alone will help with his sleep - if he is still sleeping just 2-3 hours after being on olanzapine for 3-4 days they will start trazodone. Sister will let us know how Arthur Weaver is doing in about two weeks and we will meet again in a month. I hope that state wide restrictions related to [place] will be lifted (possible move up a stage in mid September) and once Arthur Weaver can have visits and leave facility without major problems his sleep and mood will improve and hallucinations resolve. At that time we can hopefully discontinue antipsychotic medication. The plan was discussed with patient who had an opportunity to ask questions and these were all answered. I spend 60 minutes in direct face to face clinical contact with the patient and devoted approximately 50%  of this time to explanation of diagnosis, discussion of treatment options and med education.  Stephanie Acre, MD 8/20/202011:51 AM

## 2018-10-01 ENCOUNTER — Ambulatory Visit: Payer: Medicare HMO | Admitting: Orthotics

## 2018-10-02 NOTE — Progress Notes (Signed)
Office Visit Note  Patient: Arthur Weaver             Date of Birth: 05/10/1960           MRN: [id number]             PCP: Velna Hatchet, MD Referring: Velna Hatchet, MD Visit Date: 10/03/2018 Occupation: @GUAROCC @  Subjective:  Lower back pain   History of Present Illness: Arthur Weaver is a 58 y.o. male with history of osteoarthritis and DDD. He presents today with increased lower back pain that started this morning.  He denies any recent injuries or falls. He denies any radiculopathy.  He has occasional discomfort in both knee joints.  He has stiffness in his knee joints after sitting for a prolonged period of time.  He denies any joint swelling  He uses voltaren gel topically BID for pain relief.  His sister, [name], accompanied him at the visit today, and she acted as the primary historian.  According to [name] he has been hearing voices since May.  He was recently started on Zyprexa  by his psychiatrist.    Activities of Daily Living:  Patient reports joint stiffness  Patient Denies nocturnal pain.  Difficulty dressing/grooming: Denies Difficulty climbing stairs: Reports Difficulty getting out of chair: Reports Difficulty using hands for taps, buttons, cutlery, and/or writing: Denies  Review of Systems  Constitutional: Positive for fatigue. Negative for night sweats.  HENT: Negative for mouth sores, mouth dryness and nose dryness.   Eyes: Negative for redness, visual disturbance and dryness.  Respiratory: Negative for cough, hemoptysis, shortness of breath and difficulty breathing.   Cardiovascular: Negative for chest pain, palpitations, hypertension, irregular heartbeat and swelling in legs/feet.  Gastrointestinal: Negative for blood in stool, constipation and diarrhea.  Endocrine: Negative for increased urination.  Genitourinary: Negative for painful urination.  Musculoskeletal: Positive for arthralgias, joint pain and morning stiffness. Negative for joint  swelling, myalgias, muscle weakness, muscle tenderness and myalgias.  Skin: Negative for color change, rash, hair loss, nodules/bumps, skin tightness, ulcers and sensitivity to sunlight.  Allergic/Immunologic: Negative for susceptible to infections.  Neurological: Negative for dizziness, fainting, memory loss, night sweats and weakness.  Hematological: Negative for swollen glands.  Psychiatric/Behavioral: Negative for depressed mood and sleep disturbance. The patient is not nervous/anxious.     PMFS History:  Patient Active Problem List   Diagnosis Date Noted  . Psychosis ([place]) 09/27/2018  . Intellectual disability 09/27/2018  . 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 Tendonitis 09/25/2015  . High blood pressure 09/07/2010  . High cholesterol 09/07/2010  . Bilateral swelling of feet 09/07/2010  . Diabetes mellitus 09/07/2010  . Rheumatoid arthritis(714.0) 09/07/2010  . Personal history of colonic polyps 10/27/2004    Past Medical History:  Diagnosis Date  . At risk for sleep apnea    STOP-BANG= 5       SENT TO PCP 04-14-2016  . BPH (benign prostatic hyperplasia)   . DDD (degenerative disc disease), cervical   . DDD (degenerative disc disease), lumbar   . Diverticulosis of colon   . Family history of factor V deficiency    per pt's sister (whom is pt's guardian) she, pt's brother and parents have factor V but pt has never been tested  . Frequency of urination   . GERD (gastroesophageal reflux disease)   . History of adenomatous polyp of colon    2006 and 03-18-2010 tubular adenoma  . Hyperlipidemia   . Hypertension   . Lives in independent  group home resides at [facility] in [place], [place]   pt independant w/ ADLs w/ exception does not cook for shop   . Mentally challenged    SPECIAL NEEDS  . OA (osteoarthritis)    hands, knees, feet  . Pre-diabetes   . Thoracic spondylosis   . Urgency of urination   . Wears partial dentures    lower    Family History   Problem Relation Age of Onset  . Breast cancer Mother   . Brain cancer Mother   . Hypertension Mother   . Diabetes Mother   . Crohn's disease Father   . Rheum arthritis Father   . GER disease Father   . Ulcers Father   . Cataracts Sister   . Fibromyalgia Sister   . COPD Sister   . Breast cancer Sister   . GER disease Sister   . Hypothyroidism Sister   . Osteoarthritis Sister   . GER disease Brother   . GER disease Brother   . Liver cancer Brother   . Heart attack Brother   . Colon cancer Neg Hx    Past Surgical History:  Procedure Laterality Date  . CATARACT EXTRACTION W/ INTRAOCULAR LENS  IMPLANT, BILATERAL  2006  . CIRCUMCISION  03/29/2007   W/  CYSTOSCOPY AND TRANSRECTAL ULTRASOUND GUIDED PROSTATE BX'S  . COLONOSCOPY  last one 08-26-2015  . THULIUM LASER TURP (TRANSURETHRAL RESECTION OF PROSTATE) N/A 04/21/2016   Procedure: THULIUM LASER TURP (TRANSURETHRAL RESECTION OF PROSTATE);  Surgeon: Carolan Clines, MD;  Location: [facility];  Service: Urology;  Laterality: N/A;   Social History   Social History Narrative  . Not on file   There is no immunization history for the selected administration types on file for this patient.   Objective: Vital Signs: BP 140/87 (BP Location: Right Arm, Patient Position: Sitting, Cuff Size: Normal)   Pulse 65   Temp 98.6 F (37 C)   Resp 15   Ht 5\' 6"  (1.676 m)   Wt 199 lb 6.4 oz (90.4 kg)   BMI 32.18 kg/m    Physical Exam Vitals signs and nursing note reviewed.  Constitutional:      Appearance: He is well-developed.  HENT:     Head: Normocephalic and atraumatic.  Eyes:     Conjunctiva/sclera: Conjunctivae normal.     Pupils: Pupils are equal, round, and reactive to light.  Neck:     Musculoskeletal: Normal range of motion and neck supple.  Cardiovascular:     Rate and Rhythm: Normal rate and regular rhythm.     Heart sounds: Normal heart sounds.  Pulmonary:     Effort: Pulmonary effort is normal.      Breath sounds: Normal breath sounds.  Abdominal:     General: Bowel sounds are normal.     Palpations: Abdomen is soft.  Skin:    General: Skin is warm and dry.     Capillary Refill: Capillary refill takes less than 2 seconds.  Neurological:     Mental Status: He is alert and oriented to person, place, and time.  Psychiatric:        Behavior: Behavior normal.      Musculoskeletal Exam: C-spine good ROM.  Thoracic kyphosis noted.  Midline spinal tenderness in the lumbar spine.  Shoulder joints, elbow joints, wrist joints, MCPs, PIPs, and DIPs good ROM with no synovitis.  Complete fist formation bilaterally.  Hip joints good ROM.  Knee joints good ROM with no  warmth or effusion.  Valgus deformity of both knee joints.  Right ankle valgus deformity.  Right ankle joint brace.  No tenderness or swelling of left ankle joint.   CDAI Exam: CDAI Score: - Patient Global: -; Provider Global: - Swollen: -; Tender: - Joint Exam   No joint exam has been documented for this visit   There is currently no information documented on the homunculus. Go to the Rheumatology activity and complete the homunculus joint exam.  Investigation: No additional findings.  Imaging: No results found.  Recent Labs: Lab Results  Component Value Date   WBC 23.8 (H) 10/08/2016   HGB 16.8 10/08/2016   PLT 281 10/08/2016   NA 138 10/08/2016   K 3.9 10/08/2016   CL 106 10/08/2016   CO2 18 (L) 10/08/2016   GLUCOSE 154 (H) 10/08/2016   BUN 15 10/08/2016   CREATININE 0.99 10/08/2016   CALCIUM 9.2 10/08/2016   GFRAA >60 10/08/2016    Speciality Comments: No specialty comments available.  Procedures:  No procedures performed Allergies: Penicillins, Bacitracin, Imidurea, Methylisothiazolinone, Neomycin, Polymyxin b, Pork-derived products, Sulfa antibiotics, and Urea   Assessment / Plan:     Visit Diagnoses: Primary osteoarthritis of both hands: He has PIP and DIP synovial thickening consistent with  osteoarthritis of both hands.  Complete fist formation bilaterally.  Joint protection and muscle strengthening were discussed. He will follow up in 1 year.    Primary osteoarthritis of both knees: He has chronic pain and stiffness in both knee joints.  No warmth or effusion of knee joints noted.  He uses voltaren gel topically BID for pain relief.   Primary osteoarthritis of both feet: He has chronic right ankle joint.  He previously fractured the right ankle joint 3 years ago but he was not a candidate for surgery at that time.  He continues to wear a right ankle joint brace. He has a valgus deformity of the right ankle joint.    Lateral epicondylitis, left elbow: Resolved   DDD (degenerative disc disease), thoracic: He has no midline spinal tenderness.    DDD (degenerative disc disease), lumbar: He has midline spinal tenderness in the lumbar region.  He woke up this morning with increased lower back pain.  He has no symptoms of radiculopathy at this time.  He was given a handout of back exercises.  If his back pain persists he will return for a x-ray of the lumbar spine.   Leg length discrepancy  Other medical conditions are listed as follows:   History of hypercholesterolemia  History of diabetes mellitus  History of hypertension  Orders: No orders of the defined types were placed in this encounter.  No orders of the defined types were placed in this encounter.   Face-to-face time spent with patient was 30 minutes. Greater than 50% of time was spent in counseling and coordination of care.  Follow-Up Instructions: Return in about 1 year (around 10/03/2019) for Osteoarthritis, DDD.   Ofilia Neas, PA-C   I examined and evaluated the patient with Hazel Sams PA.  Patient continues to have some discomfort from osteoarthritis.  Today he woke up with some lower back pain.  He denies any radiculopathy.  He has kyphotic posture.  I demonstrated some stretching exercises in the office  today.  A handout on back exercises was given.  His sister is also concerned about his height loss.  Have advised for him to take some calcium supplement with vitamin D.  As the bone density  may not be covered at his age we decided to postpone it.  If he develops constipation from calcium then magnesium 250 mg p.o. nightly can be used .  The plan of care was discussed as noted above.  Bo Merino, MD  Note - This record has been created using Editor, commissioning.  Chart creation errors have been sought, but may not always  have been located. Such creation errors do not reflect on  the standard of medical care.

## 2018-10-03 ENCOUNTER — Other Ambulatory Visit: Payer: Self-pay

## 2018-10-03 ENCOUNTER — Ambulatory Visit (INDEPENDENT_AMBULATORY_CARE_PROVIDER_SITE_OTHER): Payer: Medicare HMO | Admitting: Rheumatology

## 2018-10-03 ENCOUNTER — Encounter: Payer: Self-pay | Admitting: Rheumatology

## 2018-10-03 VITALS — BP 140/87 | HR 65 | Temp 98.6°F | Resp 15 | Ht 66.0 in | Wt 199.4 lb

## 2018-10-03 DIAGNOSIS — M7712 Lateral epicondylitis, left elbow: Secondary | ICD-10-CM

## 2018-10-03 DIAGNOSIS — Z8639 Personal history of other endocrine, nutritional and metabolic disease: Secondary | ICD-10-CM

## 2018-10-03 DIAGNOSIS — M2170 Unequal limb length (acquired), unspecified site: Secondary | ICD-10-CM | POA: Diagnosis not present

## 2018-10-03 DIAGNOSIS — M19072 Primary osteoarthritis, left ankle and foot: Secondary | ICD-10-CM

## 2018-10-03 DIAGNOSIS — M19071 Primary osteoarthritis, right ankle and foot: Secondary | ICD-10-CM

## 2018-10-03 DIAGNOSIS — M170 Bilateral primary osteoarthritis of knee: Secondary | ICD-10-CM | POA: Diagnosis not present

## 2018-10-03 DIAGNOSIS — M51369 Other intervertebral disc degeneration, lumbar region without mention of lumbar back pain or lower extremity pain: Secondary | ICD-10-CM

## 2018-10-03 DIAGNOSIS — M5136 Other intervertebral disc degeneration, lumbar region: Secondary | ICD-10-CM | POA: Diagnosis not present

## 2018-10-03 DIAGNOSIS — M19041 Primary osteoarthritis, right hand: Secondary | ICD-10-CM

## 2018-10-03 DIAGNOSIS — M19042 Primary osteoarthritis, left hand: Secondary | ICD-10-CM

## 2018-10-03 DIAGNOSIS — Z8679 Personal history of other diseases of the circulatory system: Secondary | ICD-10-CM | POA: Diagnosis not present

## 2018-10-03 DIAGNOSIS — M5134 Other intervertebral disc degeneration, thoracic region: Secondary | ICD-10-CM

## 2018-10-03 NOTE — Patient Instructions (Signed)

## 2018-10-05 ENCOUNTER — Encounter: Payer: Self-pay | Admitting: Podiatry

## 2018-10-05 ENCOUNTER — Ambulatory Visit: Payer: Medicare HMO | Admitting: Orthotics

## 2018-10-05 ENCOUNTER — Other Ambulatory Visit: Payer: Self-pay

## 2018-10-05 ENCOUNTER — Ambulatory Visit (INDEPENDENT_AMBULATORY_CARE_PROVIDER_SITE_OTHER): Payer: Medicare HMO | Admitting: Podiatry

## 2018-10-05 DIAGNOSIS — G8929 Other chronic pain: Secondary | ICD-10-CM

## 2018-10-05 DIAGNOSIS — R6889 Other general symptoms and signs: Secondary | ICD-10-CM | POA: Diagnosis not present

## 2018-10-05 DIAGNOSIS — Z20822 Contact with and (suspected) exposure to covid-19: Secondary | ICD-10-CM

## 2018-10-05 DIAGNOSIS — E0851 Diabetes mellitus due to underlying condition with diabetic peripheral angiopathy without gangrene: Secondary | ICD-10-CM | POA: Diagnosis not present

## 2018-10-05 DIAGNOSIS — M79676 Pain in unspecified toe(s): Secondary | ICD-10-CM | POA: Diagnosis not present

## 2018-10-05 DIAGNOSIS — B351 Tinea unguium: Secondary | ICD-10-CM | POA: Diagnosis not present

## 2018-10-05 DIAGNOSIS — E1140 Type 2 diabetes mellitus with diabetic neuropathy, unspecified: Secondary | ICD-10-CM | POA: Diagnosis not present

## 2018-10-05 DIAGNOSIS — L84 Corns and callosities: Secondary | ICD-10-CM | POA: Diagnosis not present

## 2018-10-05 DIAGNOSIS — M25571 Pain in right ankle and joints of right foot: Secondary | ICD-10-CM

## 2018-10-05 NOTE — Progress Notes (Signed)
Complaint:  Visit Type: Patient returns to my office for continued preventative foot care services. Complaint: Patient states" my nails have grown long and thick and become painful to walk and wear shoes" Patient has been diagnosed with DM with neuropathy.. Patient presents to the office with his sister and has appointment with Benjie Karvonen.  She says she only wants his nails done since he has an appointment with Dr.  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 The patient presents for preventative foot care services. No changes to ROS  Podiatric Exam: Vascular: dorsalis pedis and posterior tibial pulses are absent  palpable bilateral. Capillary return is immediate. Temperature gradient is WNL. Skin turgor WNL  Sensorium: Diminished Semmes Weinstein monofilament test. Normal tactile sensation bilaterally. Nail Exam: Pt has thick disfigured discolored nails with subungual debris noted bilateral entire nail hallux through fifth toenails Ulcer Exam: There is no evidence of ulcer or pre-ulcerative changes or infection. Orthopedic Exam: Muscle tone and strength are WNL. No limitations in general ROM. No crepitus or effusions noted. Foot type and digits show no abnormalities. 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 Skin:  Porokeratosis plantar aspect midfoot  Medially.. No infection or ulcers  Diagnosis:  Onychomycosis, , Pain in right toe, pain in left toes  Treatment & Plan Procedures and Treatment: Consent by patient was obtained for treatment procedures.   Debridement of mycotic and hypertrophic toenails, 1 through 5 bilateral and clearing of subungual debris. No ulceration, no infection noted.  Patient to see Benjie Karvonen for new shoes.    Return Visit-Office Procedure: Patient instructed to return to the office for a follow up visit 3 months for continued evaluation and treatment.    Gardiner Barefoot DPM

## 2018-10-07 LAB — NOVEL CORONAVIRUS, NAA: SARS-CoV-2, NAA: NOT DETECTED

## 2018-10-08 DIAGNOSIS — Z125 Encounter for screening for malignant neoplasm of prostate: Secondary | ICD-10-CM | POA: Diagnosis not present

## 2018-10-08 DIAGNOSIS — R82998 Other abnormal findings in urine: Secondary | ICD-10-CM | POA: Diagnosis not present

## 2018-10-08 DIAGNOSIS — E7849 Other hyperlipidemia: Secondary | ICD-10-CM | POA: Diagnosis not present

## 2018-10-08 DIAGNOSIS — E1169 Type 2 diabetes mellitus with other specified complication: Secondary | ICD-10-CM | POA: Diagnosis not present

## 2018-10-08 DIAGNOSIS — E559 Vitamin D deficiency, unspecified: Secondary | ICD-10-CM | POA: Diagnosis not present

## 2018-10-10 DIAGNOSIS — M67961 Unspecified disorder of synovium and tendon, right lower leg: Secondary | ICD-10-CM | POA: Diagnosis not present

## 2018-10-10 DIAGNOSIS — L84 Corns and callosities: Secondary | ICD-10-CM | POA: Diagnosis not present

## 2018-10-12 ENCOUNTER — Telehealth (HOSPITAL_COMMUNITY): Payer: Self-pay

## 2018-10-12 ENCOUNTER — Other Ambulatory Visit (HOSPITAL_COMMUNITY): Payer: Self-pay | Admitting: Psychiatry

## 2018-10-12 MED ORDER — OLANZAPINE 20 MG PO TABS: 20.0000 mg | ORAL_TABLET | Freq: Every day | ORAL | 1 refills | Status: DC

## 2018-10-12 NOTE — Telephone Encounter (Signed)
Patients sister is calling, she states that patient is still hearing voices and crickets...she would like to know how long until the medication kicks in - patients anxiety is still high due to the voices. Please review and advise, thank you

## 2018-10-12 NOTE — Telephone Encounter (Signed)
I personally think that hallucinations are secondary to anxiety and anxiety is secondary to circumstances he found himself in due to [facility] - being locked down, unable to participate in activities he used to. He has never had psychotic sx earlier in life (per sister's report). Nevertheless, since it has been a few weeks since Zyprexa was started I increased the dose to 20 mg for his next refill.

## 2018-10-12 NOTE — Telephone Encounter (Signed)
I called the patients sister and let her know

## 2018-10-16 ENCOUNTER — Ambulatory Visit: Payer: Medicare HMO | Admitting: Podiatry

## 2018-10-16 ENCOUNTER — Ambulatory Visit: Payer: Medicare HMO | Admitting: Orthotics

## 2018-10-17 ENCOUNTER — Ambulatory Visit: Payer: Medicare HMO | Admitting: Rheumatology

## 2018-10-18 ENCOUNTER — Ambulatory Visit: Payer: Medicare HMO | Admitting: Orthotics

## 2018-10-18 ENCOUNTER — Other Ambulatory Visit: Payer: Self-pay

## 2018-10-18 DIAGNOSIS — G8929 Other chronic pain: Secondary | ICD-10-CM

## 2018-10-18 DIAGNOSIS — B351 Tinea unguium: Secondary | ICD-10-CM

## 2018-10-18 DIAGNOSIS — M2010 Hallux valgus (acquired), unspecified foot: Secondary | ICD-10-CM

## 2018-10-18 DIAGNOSIS — E0851 Diabetes mellitus due to underlying condition with diabetic peripheral angiopathy without gangrene: Secondary | ICD-10-CM

## 2018-10-18 NOTE — Progress Notes (Signed)
Patient seen today for casting of custom diabetic shoes.  Patient has AFO to address RF valgus deformity and was cast over brace.   Plan on Lossie Faes to fab athletic style, brown shoes with velcro D rings, medial flare w/ 5* medial wedge and lateral flare.

## 2018-10-19 DIAGNOSIS — Z0000 Encounter for general adult medical examination without abnormal findings: Secondary | ICD-10-CM | POA: Diagnosis not present

## 2018-10-19 DIAGNOSIS — R4182 Altered mental status, unspecified: Secondary | ICD-10-CM | POA: Diagnosis not present

## 2018-10-19 DIAGNOSIS — E559 Vitamin D deficiency, unspecified: Secondary | ICD-10-CM | POA: Diagnosis not present

## 2018-10-19 DIAGNOSIS — R740 Nonspecific elevation of levels of transaminase and lactic acid dehydrogenase [LDH]: Secondary | ICD-10-CM | POA: Diagnosis not present

## 2018-10-19 DIAGNOSIS — E785 Hyperlipidemia, unspecified: Secondary | ICD-10-CM | POA: Diagnosis not present

## 2018-10-19 DIAGNOSIS — E1169 Type 2 diabetes mellitus with other specified complication: Secondary | ICD-10-CM | POA: Diagnosis not present

## 2018-10-19 DIAGNOSIS — Z1339 Encounter for screening examination for other mental health and behavioral disorders: Secondary | ICD-10-CM | POA: Diagnosis not present

## 2018-10-19 DIAGNOSIS — N401 Enlarged prostate with lower urinary tract symptoms: Secondary | ICD-10-CM | POA: Diagnosis not present

## 2018-10-19 DIAGNOSIS — K219 Gastro-esophageal reflux disease without esophagitis: Secondary | ICD-10-CM | POA: Diagnosis not present

## 2018-10-19 DIAGNOSIS — R440 Auditory hallucinations: Secondary | ICD-10-CM | POA: Diagnosis not present

## 2018-10-19 DIAGNOSIS — G4700 Insomnia, unspecified: Secondary | ICD-10-CM | POA: Diagnosis not present

## 2018-10-24 ENCOUNTER — Ambulatory Visit (INDEPENDENT_AMBULATORY_CARE_PROVIDER_SITE_OTHER): Payer: Medicare HMO | Admitting: Psychiatry

## 2018-10-24 ENCOUNTER — Other Ambulatory Visit: Payer: Self-pay

## 2018-10-24 DIAGNOSIS — F23 Brief psychotic disorder: Secondary | ICD-10-CM

## 2018-10-24 DIAGNOSIS — F79 Unspecified intellectual disabilities: Secondary | ICD-10-CM

## 2018-10-24 DIAGNOSIS — E119 Type 2 diabetes mellitus without complications: Secondary | ICD-10-CM | POA: Diagnosis not present

## 2018-10-24 MED ORDER — OLANZAPINE 20 MG PO TABS: 20.0000 mg | ORAL_TABLET | Freq: Every day | ORAL | 1 refills | Status: DC

## 2018-10-24 MED ORDER — FLUOXETINE HCL 20 MG PO CAPS: 20.0000 mg | ORAL_CAPSULE | Freq: Every day | ORAL | 1 refills | Status: DC

## 2018-10-24 NOTE — Progress Notes (Signed)
BH MD/PA/NP OP Progress Note  10/24/2018 10:45 AM Arthur Weaver  MRN:  [id number] Interview was conducted by phone and I verified that I was speaking with the correct person using two identifiers. Arthur Weaver does not provide much input and information was provided by his sister and POA Arthur Weaver with whom I discussed the limitations of evaluation and management by telemedicine and  the availability of in person appointments. They expressed understanding and agreed to proceed.  Chief Complaint: Patient still having AH - "crickets".  HPI: Arthur Weaver is a 58 yo single white male with IDD (severity unspecified) who is being seen for new onset of insomnia, paranoid fears of becoming infected with COVID-19 and having to go to a nursing home and daily auditory hallucinations. He has not have any of these symptoms until mid May this year. At that time the group home he resides at was locked down - meaning residents are not allowed to go out or have visitors (because of COVID). Arthur Weaver's previous good sleep suffered - he started to sleep just few hours (his sister-guardian noticed that when he was on ahome visit), became paranoid, anxious, tearful and started to experience AH with voices occurring daily either yelling at him, making "mean comments" or telling him not to do things that he is required to do at the group home. He is very distraught by these and wants them to stop. Arthur Weaver has no hx of any psychiatric problems, I.e. no hx of depression, anxiety, mood swings, psychosis. He has never been psychiatrically hospitalized. He has no hx of alcohol or drug abuse. He has been prescribed by his PCP quetiapine 25 mg bid which did not benefit any of his symptoms. He was also prescribed olanzapine 15 mg at HS which he now has started 3 weeks ago instead of quetiapine. I added trazodone 50 mg prn insomnia. Patient has been staying with his sister and his sleep has been good so trazodone was not used. He still  complains of having AH but and appears withdrawn, very limitred verbal output, He is not agitated and appetite is adequate.  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 Intellectual disability  F79     Past Psychiatric History: Please see intake H&P.  Past Medical History:  Past Medical History:  Diagnosis Date  . At risk for sleep apnea    STOP-BANG= 5       SENT TO PCP 04-14-2016  . BPH (benign prostatic hyperplasia)   . DDD (degenerative disc disease), cervical   . DDD (degenerative disc disease), lumbar   . Diverticulosis of colon   . Family history of factor V deficiency    per pt's sister (whom is pt's guardian) she, pt's brother and parents have factor V but pt has never been tested  . Frequency of urination   . GERD (gastroesophageal reflux disease)   . History of adenomatous polyp of colon    2006 and 03-18-2010 tubular adenoma  . Hyperlipidemia   . Hypertension   . Lives in independent group home resides at [facility] in [place], [place]   pt independant w/ ADLs w/ exception does not cook for shop   . Mentally challenged    SPECIAL NEEDS  . OA (osteoarthritis)    hands, knees, feet  . Pre-diabetes   . Thoracic spondylosis   . Urgency of urination   . Wears partial dentures  lower    Past Surgical History:  Procedure Laterality Date  . CATARACT EXTRACTION W/ INTRAOCULAR LENS  IMPLANT, BILATERAL  2006  . CIRCUMCISION  03/29/2007   W/  CYSTOSCOPY AND TRANSRECTAL ULTRASOUND GUIDED PROSTATE BX'S  . COLONOSCOPY  last one 08-26-2015  . THULIUM LASER TURP (TRANSURETHRAL RESECTION OF PROSTATE) N/A 04/21/2016   Procedure: THULIUM LASER TURP (TRANSURETHRAL RESECTION OF PROSTATE);  Surgeon: Carolan Clines, MD;  Location: [facility];  Service: Urology;  Laterality: N/A;    Family Psychiatric History: None  Family History:  Family History  Problem Relation Age of Onset  . Breast cancer Mother    . Brain cancer Mother   . Hypertension Mother   . Diabetes Mother   . Crohn's disease Father   . Rheum arthritis Father   . GER disease Father   . Ulcers Father   . Cataracts Sister   . Fibromyalgia Sister   . COPD Sister   . Breast cancer Sister   . GER disease Sister   . Hypothyroidism Sister   . Osteoarthritis Sister   . GER disease Brother   . GER disease Brother   . Liver cancer Brother   . Heart attack Brother   . Colon cancer Neg Hx     Social History:  Social History   Socioeconomic History  . Marital status: Single    Spouse name: Not on file  . Number of children: Not on file  . Years of education: Not on file  . Highest education level: Not on file  Occupational History  . Not on file  Social Needs  . Financial resource strain: Not on file  . Food insecurity    Worry: Not on file    Inability: Not on file  . Transportation needs    Medical: Not on file    Non-medical: Not on file  Tobacco Use  . Smoking status: Never Smoker  . Smokeless tobacco: Former Systems developer    Types: Chew  Substance and Sexual Activity  . Alcohol use: No    Alcohol/week: 0.0 standard drinks  . Drug use: No  . Sexual activity: Not on file  Lifestyle  . Physical activity    Days per week: Not on file    Minutes per session: Not on file  . Stress: Not on file  Relationships  . Social Herbalist on phone: Not on file    Gets together: Not on file    Attends religious service: Not on file    Active member of club or organization: Not on file    Attends meetings of clubs or organizations: Not on file    Relationship status: Not on file  Other Topics Concern  . Not on file  Social History Narrative  . Not on file    Allergies:  Allergies  Allergen Reactions  . Penicillins Shortness Of Breath    "couldn't breathe" Has patient had a PCN reaction causing immediate rash, facial/tongue/throat swelling, SOB or lightheadedness with hypotension: No Has patient had a  PCN reaction causing severe rash involving mucus membranes or skin necrosis: No Has patient had a PCN reaction that required hospitalization: No Has patient had a PCN reaction occurring within the last 10 years: No If all of the above answers are "NO", then may proceed with Cephalosporin use. "couldn't breathe" Has patient had a PCN reaction causing immediate rash, facial/tongue/throat swelling, SOB or lightheadedness with hypotension: No Has patient had a PCN reaction causing severe  rash involving mucus membranes or skin necrosis: No Has patient had a PCN reaction that required hospitalization: No Has patient had a PCN reaction occurring within the last 10 years: No If all of the above answers are "NO", then may proceed with Cephalosporin use.   . Bacitracin Other (See Comments)    Positive patch test  . Imidurea Other (See Comments)    Positive patch test  . Methylisothiazolinone Other (See Comments)    Positive patch test Positive patch test   . Neomycin Other (See Comments)    Positive patch test  . Polymyxin B Other (See Comments)    Positive patch test  . Pork-Derived Products     AVOIDS DUE TO RELIGIOUS BELIEFS  . Sulfa Antibiotics Nausea And Vomiting  . Urea Other (See Comments)    Positive patch test    Metabolic Disorder Labs: No results found for: HGBA1C, MPG No results found for: PROLACTIN No results found for: CHOL, TRIG, HDL, CHOLHDL, VLDL, LDLCALC No results found for: TSH  Therapeutic Level Labs: No results found for: LITHIUM No results found for: VALPROATE No components found for:  CBMZ  Current Medications: Current Outpatient Medications  Medication Sig Dispense Refill  . acetaminophen (TYLENOL) 325 MG tablet Take 650 mg by mouth every 6 (six) hours as needed for moderate pain.    Marland Kitchen amLODipine (NORVASC) 5 MG tablet Take 5 mg by mouth daily.     Marland Kitchen aspirin 81 MG tablet Take 81 mg by mouth daily.      . cetirizine (ZYRTEC) 10 MG tablet Take 10 mg by mouth  daily.     . Cholecalciferol (VITAMIN D3) 1000 units CAPS Take by mouth.    . diclofenac sodium (VOLTAREN) 1 % GEL Apply 3 grams to 3 large joints up to 3 times daily 5 Tube 3  . FLUoxetine (PROZAC) 20 MG capsule Take 1 capsule (20 mg total) by mouth daily. 30 capsule 1  . Ibuprofen 200 MG CAPS Take 1 capsule (200 mg total) by mouth 3 (three) times daily. (Patient taking differently: Take 200 mg by mouth as needed. ) 90 each 3  . lisinopril (PRINIVIL,ZESTRIL) 40 MG tablet Take 40 mg by mouth daily.     . Multiple Vitamin (MULTIVITAMIN PO) Take 1 tablet by mouth every evening.     . nabumetone (RELAFEN) 500 MG tablet Take by mouth as needed.     Marland Kitchen OLANZapine (ZYPREXA) 20 MG tablet Take 1 tablet (20 mg total) by mouth at bedtime. Give at 9 PM 30 tablet 1  . omeprazole (PRILOSEC) 40 MG capsule Take 40 mg by mouth daily.     . rosuvastatin (CRESTOR) 5 MG tablet rosuvastatin 5 mg tablet    . tamsulosin (FLOMAX) 0.4 MG CAPS capsule daily.    . traZODone (DESYREL) 50 MG tablet Take 1 tablet (50 mg total) by mouth at bedtime. 30 tablet 1  . triamcinolone cream (KENALOG) 0.1 % Apply 1 application topically 3 (three) times daily.     No current facility-administered medications for this visit.      Psychiatric Specialty Exam: Review of Systems  Unable to perform ROS: Patient nonverbal    There were no vitals taken for this visit.There is no height or weight on file to calculate BMI.  General Appearance: NA  Eye Contact:  NA  Speech:  Patient did not want to engage in conversation.  Volume:  no response  Mood:  NA  Affect:  NA  Thought Process:  NA  Orientation:  Other:  unable to assess  Thought Content: Hallucinations: Auditory   Suicidal Thoughts:  No  Homicidal Thoughts:  No  Memory:  Not tested  Judgement:  Impaired  Insight:  Lacking  Psychomotor Activity:  NA  Concentration:  Concentration: unable to assess  Recall:  unable to [employer] of Knowledge: not tested  Language: Poor   Akathisia:  NA  Handed:  Right  AIMS (if indicated): not done  Assets:  Social Support  ADL's:  Intact  Cognition: WNL  Sleep:  Good     Assessment and Plan:  Arthur Weaver is a 58 yo single white male with IDD (severity unspecified) who is being seen for new onset of insomnia, paranoid fears of becoming infected with COVID-19 and having to go to a nursing home and daily auditory hallucinations. He has not have any of these symptoms until mid May this year. At that time the group home he resides at was locked down - meaning residents are not allowed to go out or have visitors (because of COVID). Arthur Weaver's previous good sleep suffered - he started to sleep just few hours (his sister-guardian noticed that when he was on ahome visit), became paranoid, anxious, tearful and started to experience AH with voices occurring daily either yelling at him, making "mean comments" or telling him not to do things that he is required to do at the group home. He is very distraught by these and wants them to stop. Arthur Weaver has no hx of any psychiatric problems, I.e. no hx of depression, anxiety, mood swings, psychosis. He has never been psychiatrically hospitalized. He has no hx of alcohol or drug abuse. He has been prescribed by his PCP quetiapine 25 mg bid which did not benefit any of his symptoms. He was also prescribed olanzapine 15 mg at HS which he now has started 3 weeks ago instead of quetiapine. I added trazodone 50 mg prn insomnia. Patient has been staying with his sister and his sleep has been good so trazodone was not used. He still complains of having AH but and appears withdrawn, very limitred verbal output, He is not agitated and appetite is adequate.  Dx impression: Psychosis unspecified - likely secondary to insomnia and or depression/stress; IDD severity unspecified  Plan: It is likely that he is suffering from depression with psychotic features and is not able to clearly communicate that. So far no clear  response to olanzapine - better sleep but AH continue. We will increase olanzapine to 20 mg at 9 PM and add fluoxetine 20 mg for mood. Trazodone will remain on prn basis in case sleep problems return when he goes back to his group home. Next appointment in 6 weeks. The plan was discussed with patient' sister who had an opportunity to ask questions and these were all answered. I spend 25 minutes in phone consultation with his POA.     Stephanie Acre, MD 10/24/2018, 10:45 AM

## 2018-10-25 DIAGNOSIS — Z23 Encounter for immunization: Secondary | ICD-10-CM | POA: Diagnosis not present

## 2018-10-29 ENCOUNTER — Other Ambulatory Visit (HOSPITAL_COMMUNITY): Payer: Self-pay

## 2018-10-29 MED ORDER — TRAZODONE HCL 50 MG PO TABS: 50.0000 mg | ORAL_TABLET | Freq: Every day | ORAL | 1 refills | Status: DC

## 2018-11-09 DIAGNOSIS — Z20828 Contact with and (suspected) exposure to other viral communicable diseases: Secondary | ICD-10-CM | POA: Diagnosis not present

## 2018-11-28 ENCOUNTER — Other Ambulatory Visit (HOSPITAL_COMMUNITY): Payer: Self-pay

## 2018-11-28 MED ORDER — OLANZAPINE 20 MG PO TABS: 20.0000 mg | ORAL_TABLET | Freq: Every day | ORAL | 1 refills | Status: DC

## 2018-11-29 ENCOUNTER — Other Ambulatory Visit (HOSPITAL_COMMUNITY): Payer: Self-pay | Admitting: Psychiatry

## 2018-12-10 ENCOUNTER — Telehealth: Payer: Self-pay | Admitting: Podiatry

## 2018-12-10 ENCOUNTER — Other Ambulatory Visit: Payer: Self-pay

## 2018-12-10 ENCOUNTER — Ambulatory Visit (INDEPENDENT_AMBULATORY_CARE_PROVIDER_SITE_OTHER): Payer: Medicare HMO | Admitting: Psychiatry

## 2018-12-10 DIAGNOSIS — F23 Brief psychotic disorder: Secondary | ICD-10-CM | POA: Diagnosis not present

## 2018-12-10 DIAGNOSIS — F79 Unspecified intellectual disabilities: Secondary | ICD-10-CM | POA: Diagnosis not present

## 2018-12-10 MED ORDER — OLANZAPINE 15 MG PO TABS: 15.0000 mg | ORAL_TABLET | Freq: Every day | ORAL | 0 refills | Status: DC

## 2018-12-10 NOTE — Telephone Encounter (Signed)
pts sister Arthur Weaver left message stating pts nursing facility is not letting pts leave and she wants to just pick up the shoes and drop them off there.  I returned call and explained that per medicare pt has to be seen we cannot just give shoes to family member or pt without being verifying they fit patient. The sister is going to call corporate office and see if they can work something out. Because currently they require a pt that leaves the facility to stay out for 2 wks and she is not able to do that currently it is to much for her. The facility told the sister it would not be until next year that they maybe able to leave without the hold. Pts sister is aware that if he picks them up next yr it will be considered 2021 years shoes not 2020.

## 2018-12-10 NOTE — Progress Notes (Signed)
BH MD/PA/NP OP Progress Note  12/10/2018 11:45 AM Arthur Weaver  MRN:  [id number] Interview was conducted by phone and I verified that I was speaking with the correct person using two identifiers. Arthur Weaver does not provide much input and information was provided by his sister and POA Arthur Weaver with whom I discussed the limitations of evaluation and management by telemedicine and  the availability of in person appointments. They expressed understanding and agreed to proceed.  Chief Complaint: Patient reports feeling better, no AH.  HPI: Arthur Weaver is a 58 yo single white male with IDD (severity unspecified) who developed insomnia, paranoid fears of becoming infected with COVID-19 and having to go to a nursing home and daily auditory hallucinations. He has not have any of these symptoms until mid May this year. At that time the group home he resides at was locked down - meaning residents are not allowed to go out or have visitors (because of COVID). Arthur Weaver's previous good sleep suffered - he started to sleep just few hours (his sister-guardian noticed that when he was on ahome visit), became paranoid, anxious, tearful and started to experience AH with voices occurring daily either yelling at him, making "mean comments" or telling him not to do things that he is required to do at the group home. He was very distraught by these. Arthur Weaver has no hx of any psychiatric problems, I.e. no hx of depression, anxiety, mood swings, psychosis. He has never been psychiatrically hospitalized. He has no hx of alcohol or drug abuse. He was prescribed olanzapine - now for 10 weeks. I added trazodone 50 mg prn insomnia. Patient while staying with his sister started to sleep better so trazodone was not used. He was withdrawn, very limitred verbal output, but not agitated and his appetite was normal. We have added fluoxetine 20 mg on suspicion that he has been feeling depressed/anxioeus - he is not a good communicator due to  IDD. Sister reports that his affect brightened and he expressed feeling "like his old self". AG have fully resolved few weeks ago.   Visit Diagnosis:    ICD-10-CM   1. 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 Intellectual disability  F79     Past Psychiatric History: None.  Past Medical History:  Past Medical History:  Diagnosis Date  . At risk for sleep apnea    STOP-BANG= 5       SENT TO PCP 04-14-2016  . BPH (benign prostatic hyperplasia)   . DDD (degenerative disc disease), cervical   . DDD (degenerative disc disease), lumbar   . Diverticulosis of colon   . Family history of factor V deficiency    per pt's sister (whom is pt's guardian) she, pt's brother and parents have factor V but pt has never been tested  . Frequency of urination   . GERD (gastroesophageal reflux disease)   . History of adenomatous polyp of colon    2006 and 03-18-2010 tubular adenoma  . Hyperlipidemia   . Hypertension   . Lives in independent group home resides at [facility] in [place], [place]   pt independant w/ ADLs w/ exception does not cook for shop   . Mentally challenged    SPECIAL NEEDS  . OA (osteoarthritis)    hands, knees, feet  . Pre-diabetes   . Thoracic spondylosis   . Urgency of urination   . Wears partial dentures    lower    Past Surgical History:  Procedure  Laterality Date  . CATARACT EXTRACTION W/ INTRAOCULAR LENS  IMPLANT, BILATERAL  2006  . CIRCUMCISION  03/29/2007   W/  CYSTOSCOPY AND TRANSRECTAL ULTRASOUND GUIDED PROSTATE BX'S  . COLONOSCOPY  last one 08-26-2015  . THULIUM LASER TURP (TRANSURETHRAL RESECTION OF PROSTATE) N/A 04/21/2016   Procedure: THULIUM LASER TURP (TRANSURETHRAL RESECTION OF PROSTATE);  Surgeon: Carolan Clines, MD;  Location: [facility];  Service: Urology;  Laterality: N/A;    Family Psychiatric History: None.  Family History:  Family History  Problem Relation Age of Onset  . Breast cancer Mother   . Brain  cancer Mother   . Hypertension Mother   . Diabetes Mother   . Crohn's disease Father   . Rheum arthritis Father   . GER disease Father   . Ulcers Father   . Cataracts Sister   . Fibromyalgia Sister   . COPD Sister   . Breast cancer Sister   . GER disease Sister   . Hypothyroidism Sister   . Osteoarthritis Sister   . GER disease Brother   . GER disease Brother   . Liver cancer Brother   . Heart attack Brother   . Colon cancer Neg Hx     Social History:  Social History   Socioeconomic History  . Marital status: Single    Spouse name: Not on file  . Number of children: Not on file  . Years of education: Not on file  . Highest education level: Not on file  Occupational History  . Not on file  Social Needs  . Financial resource strain: Not on file  . Food insecurity    Worry: Not on file    Inability: Not on file  . Transportation needs    Medical: Not on file    Non-medical: Not on file  Tobacco Use  . Smoking status: Never Smoker  . Smokeless tobacco: Former Systems developer    Types: Chew  Substance and Sexual Activity  . Alcohol use: No    Alcohol/week: 0.0 standard drinks  . Drug use: No  . Sexual activity: Not on file  Lifestyle  . Physical activity    Days per week: Not on file    Minutes per session: Not on file  . Stress: Not on file  Relationships  . Social Herbalist on phone: Not on file    Gets together: Not on file    Attends religious service: Not on file    Active member of club or organization: Not on file    Attends meetings of clubs or organizations: Not on file    Relationship status: Not on file  Other Topics Concern  . Not on file  Social History Narrative  . Not on file    Allergies:  Allergies  Allergen Reactions  . Penicillins Shortness Of Breath    "couldn't breathe" Has patient had a PCN reaction causing immediate rash, facial/tongue/throat swelling, SOB or lightheadedness with hypotension: No Has patient had a PCN reaction  causing severe rash involving mucus membranes or skin necrosis: No Has patient had a PCN reaction that required hospitalization: No Has patient had a PCN reaction occurring within the last 10 years: No If all of the above answers are "NO", then may proceed with Cephalosporin use. "couldn't breathe" Has patient had a PCN reaction causing immediate rash, facial/tongue/throat swelling, SOB or lightheadedness with hypotension: No Has patient had a PCN reaction causing severe rash involving mucus membranes or skin necrosis: No Has  patient had a PCN reaction that required hospitalization: No Has patient had a PCN reaction occurring within the last 10 years: No If all of the above answers are "NO", then may proceed with Cephalosporin use.   . Bacitracin Other (See Comments)    Positive patch test  . Imidurea Other (See Comments)    Positive patch test  . Methylisothiazolinone Other (See Comments)    Positive patch test Positive patch test   . Neomycin Other (See Comments)    Positive patch test  . Polymyxin B Other (See Comments)    Positive patch test  . Pork-Derived Products     AVOIDS DUE TO RELIGIOUS BELIEFS  . Sulfa Antibiotics Nausea And Vomiting  . Urea Other (See Comments)    Positive patch test    Metabolic Disorder Labs: No results found for: HGBA1C, MPG No results found for: PROLACTIN No results found for: CHOL, TRIG, HDL, CHOLHDL, VLDL, LDLCALC No results found for: TSH  Therapeutic Level Labs: No results found for: LITHIUM No results found for: VALPROATE No components found for:  CBMZ  Current Medications: Current Outpatient Medications  Medication Sig Dispense Refill  . acetaminophen (TYLENOL) 325 MG tablet Take 650 mg by mouth every 6 (six) hours as needed for moderate pain.    Marland Kitchen amLODipine (NORVASC) 5 MG tablet Take 5 mg by mouth daily.     Marland Kitchen aspirin 81 MG tablet Take 81 mg by mouth daily.      . cetirizine (ZYRTEC) 10 MG tablet Take 10 mg by mouth daily.      . Cholecalciferol (VITAMIN D3) 1000 units CAPS Take by mouth.    . diclofenac sodium (VOLTAREN) 1 % GEL Apply 3 grams to 3 large joints up to 3 times daily 5 Tube 3  . FLUoxetine (PROZAC) 20 MG capsule TAKE 1 CAPSULE BY MOUTH EVERY MORNING WITH FOOD 30 capsule 10  . Ibuprofen 200 MG CAPS Take 1 capsule (200 mg total) by mouth 3 (three) times daily. (Patient taking differently: Take 200 mg by mouth as needed. ) 90 each 3  . lisinopril (PRINIVIL,ZESTRIL) 40 MG tablet Take 40 mg by mouth daily.     . Multiple Vitamin (MULTIVITAMIN PO) Take 1 tablet by mouth every evening.     . nabumetone (RELAFEN) 500 MG tablet Take by mouth as needed.     Marland Kitchen OLANZapine (ZYPREXA) 15 MG tablet Take 1 tablet (15 mg total) by mouth at bedtime. Please give between 9 and 10 PM. 30 tablet 0  . omeprazole (PRILOSEC) 40 MG capsule Take 40 mg by mouth daily.     . rosuvastatin (CRESTOR) 5 MG tablet rosuvastatin 5 mg tablet    . tamsulosin (FLOMAX) 0.4 MG CAPS capsule daily.    . traZODone (DESYREL) 50 MG tablet TAKE 1 TABLET BY MOUTH AT BEDTIME 31 tablet 10  . triamcinolone cream (KENALOG) 0.1 % Apply 1 application topically 3 (three) times daily.     No current facility-administered medications for this visit.      Psychiatric Specialty Exam: Review of Systems  Unable to perform ROS: Patient nonverbal    There were no vitals taken for this visit.There is no height or weight on file to calculate BMI.  General Appearance: NA  Eye Contact:  NA  Speech:  No verbal responses.  Volume:  No response.  Mood:  Reportedly "better".  Affect:  NA  Thought Process:  NA  Orientation:  Other:  unable to assess.  Thought Content: NA  Suicidal Thoughts:  No  Homicidal Thoughts:  No  Memory:  NA  Judgement:  NA  Insight:  Lacking  Psychomotor Activity:  NA  Concentration:  Concentration: NA  Recall:  NA  Fund of Knowledge: NA  Language: NA  Akathisia:  Negative  Handed:  Right  AIMS (if indicated): not done   Assets:  Social Support  ADL's:  Intact  Cognition: WNL  Sleep:  Good    Assessment and Plan:  Kastiel is a 58 yo single white male with IDD (severity unspecified) who developed insomnia, paranoid fears of becoming infected with COVID-19 and having to go to a nursing home and daily auditory hallucinations. He has not have any of these symptoms until mid May this year. At that time the group home he resides at was locked down - meaning residents are not allowed to go out or have visitors (because of COVID). Arthur Weaver's previous good sleep suffered - he started to sleep just few hours (his sister-guardian noticed that when he was on ahome visit), became paranoid, anxious, tearful and started to experience AH with voices occurring daily either yelling at him, making "mean comments" or telling him not to do things that he is required to do at the group home. He was very distraught by these. [place] has no hx of any psychiatric problems, I.e. no hx of depression, anxiety, mood swings, psychosis. He has never been psychiatrically hospitalized. He has no hx of alcohol or drug abuse. He was prescribed olanzapine - now for 10 weeks. I added trazodone 50 mg prn insomnia. Patient while staying with his sister started to sleep better so trazodone was not used. He was withdrawn, very limitred verbal output, but not agitated and his appetite was normal. We have added fluoxetine 20 mg on suspicion that he has been feeling depressed/anxioeus - he is not a good communicator due to IDD. Sister reports that his affect brightened and he expressed feeling "like his old self". AG have fully resolved few weeks ago.   Dx impression: Psychosis unspecified - likely secondary to insomnia and or depression/stress; IDD severity unspecified  Plan: We will continue fluoxetine 20 mg daily and start to decrease olanzapine by 5 mg per month until dc. It makes him very drowsy so it should be given at bedtime - at the group home they have  been giving it at 7 PM. Trazodone will remain on prn basis in case sleep problems return once he is off olanzapine. Next appointment in 4 weeks. The plan was discussed with patient's sister who had an opportunity to ask questions and these were all answered. I spend22minutes in phone consultation with his POA.     Stephanie Acre, MD 12/10/2018, 11:45 AM

## 2018-12-19 ENCOUNTER — Other Ambulatory Visit (HOSPITAL_COMMUNITY): Payer: Self-pay | Admitting: Psychiatry

## 2018-12-23 ENCOUNTER — Other Ambulatory Visit (HOSPITAL_COMMUNITY): Payer: Self-pay | Admitting: Psychiatry

## 2019-01-02 ENCOUNTER — Ambulatory Visit: Payer: Medicare HMO | Admitting: Orthotics

## 2019-01-02 ENCOUNTER — Ambulatory Visit: Payer: Medicare HMO | Admitting: Podiatry

## 2019-01-02 ENCOUNTER — Other Ambulatory Visit (HOSPITAL_COMMUNITY): Payer: Self-pay | Admitting: *Deleted

## 2019-01-09 ENCOUNTER — Other Ambulatory Visit: Payer: Self-pay

## 2019-01-09 ENCOUNTER — Ambulatory Visit (INDEPENDENT_AMBULATORY_CARE_PROVIDER_SITE_OTHER): Payer: Medicare HMO | Admitting: Psychiatry

## 2019-01-09 DIAGNOSIS — F79 Unspecified intellectual disabilities: Secondary | ICD-10-CM | POA: Diagnosis not present

## 2019-01-09 DIAGNOSIS — F23 Brief psychotic disorder: Secondary | ICD-10-CM

## 2019-01-09 MED ORDER — OLANZAPINE 7.5 MG PO TABS: 7.5000 mg | ORAL_TABLET | Freq: Every day | ORAL | 1 refills | Status: DC

## 2019-01-09 NOTE — Progress Notes (Signed)
[facility] MD/PA/NP OP Progress Note  01/09/2019 11:21 AM Arthur Weaver  MRN:  [id number] Interview was conductedby phoneand I verified that I was speaking with the correct person using two identifiers. Arthur Weaver does not provide much input and information was provided by his sister and POA Arthur Weaver with whomI discussed the limitations of evaluation and management by telemedicine and the availability of in person appointments. Theyexpressed understanding and agreed to proceed.  Chief Complaint: None at this time.  HPI: 58 yo single white male with IDD (severity unspecified) who developed insomnia, paranoid fears of becoming infected with COVID-19 and having to go to a nursing home and daily auditory hallucinations. He has not have any of these symptoms until mid May this year. At that time the group home he resides at was locked down - meaning residents are not allowed to go out or have visitors (because of COVID). Arthur Weaver's previous good sleep suffered - he started to sleep just few hours (his sister-guardian noticed that when he was on ahome visit), became paranoid, anxious, tearful and started to experience AH with voices occurring daily either yelling at him, making "mean comments" or telling him not to do things that he is required to do at the group home. He was very distraught by these. Arthur Weaver has no hx of any psychiatric problems, I.e. no hx of depression, anxiety, mood swings, psychosis. He has never been psychiatrically hospitalized. He has no hx of alcohol or drug abuse. He was prescribed olanzapine - now for close to 3 months. I added trazodone 50 mg prn insomnia. Patient while staying with his sister started to sleep better so trazodone was not used. He was withdrawn, very limitred verbal output, but not agitated and his appetite was normal. We have added fluoxetine 20 mg on suspicion that he has been feeling depressed/anxious - he is not a good communicator due to IDD. Sister reports that his  affect brightened and he expressed feeling "like his old self". AH have fully resolved few weeks ago. Sleep and appetite are normal per staff report. We have started to decrease dose of olanzapine as it has been making him very drowsy.  Visit Diagnosis:    ICD-10-CM   1. Intellectual disability  F79   2. 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  Past Medical History:  Past Medical History:  Diagnosis Date  . At risk for sleep apnea    STOP-BANG= 5       SENT TO PCP 04-14-2016  . BPH (benign prostatic hyperplasia)   . DDD (degenerative disc disease), cervical   . DDD (degenerative disc disease), lumbar   . Diverticulosis of colon   . Family history of factor V deficiency    per pt's sister (whom is pt's guardian) she, pt's brother and parents have factor V but pt has never been tested  . Frequency of urination   . GERD (gastroesophageal reflux disease)   . History of adenomatous polyp of colon    2006 and 03-18-2010 tubular adenoma  . Hyperlipidemia   . Hypertension   . Lives in independent group home resides [facility] in [place], [place]   pt independant w/ ADLs w/ exception does not cook for shop   . Mentally challenged    SPECIAL NEEDS  . OA (osteoarthritis)    hands, knees, feet  . Pre-diabetes   . Thoracic spondylosis   . Urgency of urination   .  Wears partial dentures    lower    Past Surgical History:  Procedure Laterality Date  . CATARACT EXTRACTION W/ INTRAOCULAR LENS  IMPLANT, BILATERAL  2006  . CIRCUMCISION  03/29/2007   W/  CYSTOSCOPY AND TRANSRECTAL ULTRASOUND GUIDED PROSTATE BX'S  . COLONOSCOPY  last one 08-26-2015  . THULIUM LASER TURP (TRANSURETHRAL RESECTION OF PROSTATE) N/A 04/21/2016   Procedure: THULIUM LASER TURP (TRANSURETHRAL RESECTION OF PROSTATE);  Surgeon: Carolan Clines, MD;  Location: [facility];  Service: Urology;  Laterality: N/A;    Family  Psychiatric History: None.  Family History:  Family History  Problem Relation Age of Onset  . Breast cancer Mother   . Brain cancer Mother   . Hypertension Mother   . Diabetes Mother   . Crohn's disease Father   . Rheum arthritis Father   . GER disease Father   . Ulcers Father   . Cataracts Sister   . Fibromyalgia Sister   . COPD Sister   . Breast cancer Sister   . GER disease Sister   . Hypothyroidism Sister   . Osteoarthritis Sister   . GER disease Brother   . GER disease Brother   . Liver cancer Brother   . Heart attack Brother   . Colon cancer Neg Hx     Social History:  Social History   Socioeconomic History  . Marital status: Single    Spouse name: Not on file  . Number of children: Not on file  . Years of education: Not on file  . Highest education level: Not on file  Occupational History  . Not on file  Social Needs  . Financial resource strain: Not on file  . Food insecurity    Worry: Not on file    Inability: Not on file  . Transportation needs    Medical: Not on file    Non-medical: Not on file  Tobacco Use  . Smoking status: Never Smoker  . Smokeless tobacco: Former Systems developer    Types: Chew  Substance and Sexual Activity  . Alcohol use: No    Alcohol/week: 0.0 standard drinks  . Drug use: No  . Sexual activity: Not on file  Lifestyle  . Physical activity    Days per week: Not on file    Minutes per session: Not on file  . Stress: Not on file  Relationships  . Social Herbalist on phone: Not on file    Gets together: Not on file    Attends religious service: Not on file    Active member of club or organization: Not on file    Attends meetings of clubs or organizations: Not on file    Relationship status: Not on file  Other Topics Concern  . Not on file  Social History Narrative  . Not on file    Allergies:  Allergies  Allergen Reactions  . Penicillins Shortness Of Breath    "couldn't breathe" Has patient had a PCN reaction  causing immediate rash, facial/tongue/throat swelling, SOB or lightheadedness with hypotension: No Has patient had a PCN reaction causing severe rash involving mucus membranes or skin necrosis: No Has patient had a PCN reaction that required hospitalization: No Has patient had a PCN reaction occurring within the last 10 years: No If all of the above answers are "NO", then may proceed with Cephalosporin use. "couldn't breathe" Has patient had a PCN reaction causing immediate rash, facial/tongue/throat swelling, SOB or lightheadedness with hypotension: No Has patient  had a PCN reaction causing severe rash involving mucus membranes or skin necrosis: No Has patient had a PCN reaction that required hospitalization: No Has patient had a PCN reaction occurring within the last 10 years: No If all of the above answers are "NO", then may proceed with Cephalosporin use.   . Bacitracin Other (See Comments)    Positive patch test  . Imidurea Other (See Comments)    Positive patch test  . Methylisothiazolinone Other (See Comments)    Positive patch test Positive patch test   . Neomycin Other (See Comments)    Positive patch test  . Polymyxin B Other (See Comments)    Positive patch test  . Pork-Derived Products     AVOIDS DUE TO RELIGIOUS BELIEFS  . Sulfa Antibiotics Nausea And Vomiting  . Urea Other (See Comments)    Positive patch test    Metabolic Disorder Labs: No results found for: HGBA1C, MPG No results found for: PROLACTIN No results found for: CHOL, TRIG, HDL, CHOLHDL, VLDL, LDLCALC No results found for: TSH  Therapeutic Level Labs: No results found for: LITHIUM No results found for: VALPROATE No components found for:  CBMZ  Current Medications: Current Outpatient Medications  Medication Sig Dispense Refill  . acetaminophen (TYLENOL) 325 MG tablet Take 650 mg by mouth every 6 (six) hours as needed for moderate pain.    Marland Kitchen amLODipine (NORVASC) 5 MG tablet Take 5 mg by mouth  daily.     Marland Kitchen aspirin 81 MG tablet Take 81 mg by mouth daily.      . cetirizine (ZYRTEC) 10 MG tablet Take 10 mg by mouth daily.     . Cholecalciferol (VITAMIN D3) 1000 units CAPS Take by mouth.    . diclofenac sodium (VOLTAREN) 1 % GEL Apply 3 grams to 3 large joints up to 3 times daily 5 Tube 3  . FLUoxetine (PROZAC) 20 MG capsule TAKE 1 CAPSULE BY MOUTH EVERY DAY IN THE MORNING WITH FOOD 30 capsule 1  . Ibuprofen 200 MG CAPS Take 1 capsule (200 mg total) by mouth 3 (three) times daily. (Patient taking differently: Take 200 mg by mouth as needed. ) 90 each 3  . lisinopril (PRINIVIL,ZESTRIL) 40 MG tablet Take 40 mg by mouth daily.     . Multiple Vitamin (MULTIVITAMIN PO) Take 1 tablet by mouth every evening.     . nabumetone (RELAFEN) 500 MG tablet Take by mouth as needed.     Marland Kitchen OLANZapine (ZYPREXA) 7.5 MG tablet Take 1 tablet (7.5 mg total) by mouth at bedtime. Please give between 9 and 10 PM. 30 tablet 1  . omeprazole (PRILOSEC) 40 MG capsule Take 40 mg by mouth daily.     . rosuvastatin (CRESTOR) 5 MG tablet rosuvastatin 5 mg tablet    . tamsulosin (FLOMAX) 0.4 MG CAPS capsule daily.    . traZODone (DESYREL) 50 MG tablet TAKE 1 TABLET BY MOUTH AT BEDTIME 31 tablet 10  . triamcinolone cream (KENALOG) 0.1 % Apply 1 application topically 3 (three) times daily.     No current facility-administered medications for this visit.       Psychiatric Specialty Exam: Review of Systems  Unable to perform ROS: Other    There were no vitals taken for this visit.There is no height or weight on file to calculate BMI.  General Appearance: NA  Eye Contact:  NA  Speech:  Limited.  Volume:  Decreased  Mood:  Neutral  Affect:  NA  Thought Process:  Impoversished.  Orientation:  NA  Thought Content: No hallucinations or delusions.   Suicidal Thoughts:  No  Homicidal Thoughts:  No  Memory:  Not tested  Judgement:  Fair  Insight:  Shallow  Psychomotor Activity:  NA  Concentration:  Concentration:  NA  Recall:  NA  Fund of Knowledge: Poor  Language: Fair  Akathisia:  Negative  Handed:  Right  AIMS (if indicated): not done  Assets:  Financial Resources/Insurance Housing Physical Health Social Support  ADL's:  Intact  Cognition: WNL  Sleep:  Good    Assessment and Plan: 58 yo single white male with IDD (severity unspecified) who developed insomnia, paranoid fears of becoming infected with COVID-19 and having to go to a nursing home and daily auditory hallucinations. He has not have any of these symptoms until mid May this year. At that time the group home he resides at was locked down - meaning residents are not allowed to go out or have visitors (because of COVID). Arthur Weaver's previous good sleep suffered - he started to sleep just few hours (his sister-guardian noticed that when he was on ahome visit), became paranoid, anxious, tearful and started to experience AH with voices occurring daily either yelling at him, making "mean comments" or telling him not to do things that he is required to do at the group home. He was very distraught by these. Jeffree has no hx of any psychiatric problems, I.e. no hx of depression, anxiety, mood swings, psychosis. He has never been psychiatrically hospitalized. He has no hx of alcohol or drug abuse. He was prescribed olanzapine - now for close to 3 months. I added trazodone 50 mg prn insomnia. Patient while staying with his sister started to sleep better so trazodone was not used. He was withdrawn, very limitred verbal output, but not agitated and his appetite was normal. We have added fluoxetine 20 mg on suspicion that he has been feeling depressed/anxious - he is not a good communicator due to IDD. Sister reports that his affect brightened and he expressed feeling "like his old self". AH have fully resolved few weeks ago. Sleep and appetite are normal per staff report. We have started to decrease dose of olanzapine as it has been making him very drowsy.  Dx  impression: Psychosis unspecified - likely secondary to insomniaand or depression/stress; IDD severity unspecified  Plan:We will continue fluoxetine 20 mg daily and decrease olanzapine to 7.5 mg at HS and possibly discontinue it in a month altogether. Trazodone will remain on prn basis in case sleep problems return once he is off olanzapine. Next appointment in a month.The plan was discussed with patient's [id number] had an opportunity to ask questions and these were all answered. I spend13minutes inphone consultation with his POA.    Stephanie Acre, MD 01/09/2019, 11:21 AM

## 2019-01-10 ENCOUNTER — Encounter (HOSPITAL_COMMUNITY): Payer: Self-pay

## 2019-01-10 ENCOUNTER — Telehealth (HOSPITAL_COMMUNITY): Payer: Self-pay

## 2019-01-10 NOTE — Telephone Encounter (Signed)
Error

## 2019-01-22 ENCOUNTER — Other Ambulatory Visit: Payer: Self-pay

## 2019-01-22 ENCOUNTER — Ambulatory Visit (INDEPENDENT_AMBULATORY_CARE_PROVIDER_SITE_OTHER): Payer: Medicare HMO | Admitting: Orthotics

## 2019-01-22 ENCOUNTER — Encounter: Payer: Self-pay | Admitting: Podiatry

## 2019-01-22 ENCOUNTER — Ambulatory Visit (INDEPENDENT_AMBULATORY_CARE_PROVIDER_SITE_OTHER): Payer: Medicare HMO | Admitting: Podiatry

## 2019-01-22 DIAGNOSIS — L84 Corns and callosities: Secondary | ICD-10-CM | POA: Diagnosis not present

## 2019-01-22 DIAGNOSIS — M19071 Primary osteoarthritis, right ankle and foot: Secondary | ICD-10-CM

## 2019-01-22 DIAGNOSIS — M79676 Pain in unspecified toe(s): Secondary | ICD-10-CM

## 2019-01-22 DIAGNOSIS — B351 Tinea unguium: Secondary | ICD-10-CM

## 2019-01-22 DIAGNOSIS — M2012 Hallux valgus (acquired), left foot: Secondary | ICD-10-CM

## 2019-01-22 DIAGNOSIS — M25579 Pain in unspecified ankle and joints of unspecified foot: Secondary | ICD-10-CM

## 2019-01-22 DIAGNOSIS — E0851 Diabetes mellitus due to underlying condition with diabetic peripheral angiopathy without gangrene: Secondary | ICD-10-CM | POA: Diagnosis not present

## 2019-01-22 DIAGNOSIS — G8929 Other chronic pain: Secondary | ICD-10-CM

## 2019-01-22 DIAGNOSIS — M2010 Hallux valgus (acquired), unspecified foot: Secondary | ICD-10-CM

## 2019-01-22 NOTE — Progress Notes (Signed)
Complaint:  Visit Type: Patient returns to my office for continued preventative foot care services. Complaint: Patient states" my nails have grown long and thick and become painful to walk and wear shoes" Patient has been diagnosed with DM with neuropathy.. Patient presents to the office with his sister and has appointment with Benjie Karvonen.  She says she only wants his nails done since he has an appointment with Dr.  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 The patient presents for preventative foot care services. No changes to ROS  Podiatric Exam: Vascular: dorsalis pedis and posterior tibial pulses are absent  palpable bilateral. Capillary return is immediate. Temperature gradient is WNL. Skin turgor WNL  Sensorium: Diminished Semmes Weinstein monofilament test. Normal tactile sensation bilaterally. Nail Exam: Pt has thick disfigured discolored nails with subungual debris noted bilateral entire nail hallux through fifth toenails Ulcer Exam: There is no evidence of ulcer or pre-ulcerative changes or infection. Orthopedic Exam: Muscle tone and strength are WNL. No limitations in general ROM. No crepitus or effusions noted. Foot type and digits show no abnormalities. 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 Skin:  Porokeratosis plantar aspect midfoot  Medially.. No infection or ulcers  Diagnosis:  Onychomycosis, , Pain in right toe, pain in left toes  Treatment & Plan Procedures and Treatment: Consent by patient was obtained for treatment procedures.   Debridement of mycotic and hypertrophic toenails, 1 through 5 bilateral and clearing of subungual debris. No ulceration, no infection noted.  Patient to see Benjie Karvonen for new shoes.    Return Visit-Office Procedure: Patient instructed to return to the office for a follow up visit 3 months for continued evaluation and treatment.    Gardiner Barefoot DPM

## 2019-01-22 NOTE — Progress Notes (Signed)
Patient came in today to pick up CUSTOM MOLDED diabetic shoes and custom inserts.  Same was well pleased with fit and function.   The patient could ambulate without any discomfort; there were no signs of any quality issues. The foot ortheses offered full contact with plantar surface and contoured the arch well.   The shoes fit well with no heel slippage and areas of pressure concern.   Patient advised to contact us if any problems arise.  Patient also advised on how to report any issues.

## 2019-01-28 ENCOUNTER — Other Ambulatory Visit (HOSPITAL_COMMUNITY): Payer: Self-pay | Admitting: Psychiatry

## 2019-02-13 ENCOUNTER — Ambulatory Visit (INDEPENDENT_AMBULATORY_CARE_PROVIDER_SITE_OTHER): Payer: Medicare HMO | Admitting: Psychiatry

## 2019-02-13 ENCOUNTER — Other Ambulatory Visit: Payer: Self-pay

## 2019-02-13 DIAGNOSIS — F79 Unspecified intellectual disabilities: Secondary | ICD-10-CM

## 2019-02-13 DIAGNOSIS — F23 Brief psychotic disorder: Secondary | ICD-10-CM

## 2019-02-13 MED ORDER — FLUOXETINE HCL 20 MG PO CAPS: ORAL_CAPSULE | ORAL | 5 refills | Status: DC

## 2019-02-13 NOTE — Progress Notes (Signed)
BH MD/PA/NP OP Progress Note  02/13/2019 11:43 AM Arthur Weaver  MRN:  [id number] Visit was conductedby phoneand I verified that I was speaking with the correct person using two identifiers. Information was provided by his sister and POA Animator with whomI discussed the limitations of evaluation and management by telemedicine and the availability of in person appointments. Theyexpressed understanding and agreed to proceed.  Chief Complaint: None.  HPI: 59 yo single white male with IDD (severity unspecified) whodevelopedinsomnia, paranoid fears of becoming infected with COVID-19 and having to go to a nursing home and daily auditory hallucinations. He has not have any of these symptoms until mid May 2020. At that time the group home he resides at was locked down - meaning residents are not allowed to go out or have visitors (because of COVID). [name]'s previously good sleep suffered - he started to sleep just few hours (his sister-guardian noticed that when he was on ahome visit), became paranoid, anxious, tearful and started to experience AH with voices occurring daily either yelling at him, making "mean comments" or telling him not to do things that he is required to do at the group home. Hillary has no hx of any psychiatric problems, I.e. no hx of depression, anxiety, mood swings, psychosis.  He wasprescribed olanzapine- now for close to 5 months.I added trazodone 50 mg prn insomnia. Patientwhilestaying with his sisterstarted to sleep betterso trazodone was not used. Hewaswithdrawn, very limitred verbal output,butnot agitated andhisappetitewas normal. We have added fluoxetine 20 mg on suspicion that he has been feeling depressed/anxious - he is not a good communicator due to IDD. Sister reports that his affect brightened and he expressed feeling "like his old self". AH have fully resolved over 2 months ago.Sleep and appetite are normal per staff report. We have started to  decrease dose of olanzapine as it has been making him very drowsy. He also gained close to 30 lbs on it  Per sister's report.  Visit Diagnosis:    ICD-10-CM   1. Intellectual disability  F79   2. 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  Past Medical History:  Past Medical History:  Diagnosis Date  . At risk for sleep apnea    STOP-BANG= 5       SENT TO PCP 04-14-2016  . BPH (benign prostatic hyperplasia)   . DDD (degenerative disc disease), cervical   . DDD (degenerative disc disease), lumbar   . Diverticulosis of colon   . Family history of factor V deficiency    per pt's sister (whom is pt's guardian) she, pt's brother and parents have factor V but pt has never been tested  . Frequency of urination   . GERD (gastroesophageal reflux disease)   . History of adenomatous polyp of colon    2006 and 03-18-2010 tubular adenoma  . Hyperlipidemia   . Hypertension   . Lives in independent group home resides at [facility] in [place], [place]   pt independant w/ ADLs w/ exception does not cook for shop   . Mentally challenged    SPECIAL NEEDS  . OA (osteoarthritis)    hands, knees, feet  . Pre-diabetes   . Thoracic spondylosis   . Urgency of urination   . Wears partial dentures    lower    Past Surgical History:  Procedure Laterality Date  . CATARACT EXTRACTION W/ INTRAOCULAR LENS  IMPLANT, BILATERAL  2006  . CIRCUMCISION  03/29/2007   W/  CYSTOSCOPY AND TRANSRECTAL ULTRASOUND GUIDED PROSTATE BX'S  . COLONOSCOPY  last one 08-26-2015  . THULIUM LASER TURP (TRANSURETHRAL RESECTION OF PROSTATE) N/A 04/21/2016   Procedure: THULIUM LASER TURP (TRANSURETHRAL RESECTION OF PROSTATE);  Surgeon: Carolan Clines, MD;  [facility];  Service: Urology;  Laterality: N/A;    Family Psychiatric History: None  Family History:  Family History  Problem Relation Age of Onset  . Breast cancer Mother    . Brain cancer Mother   . Hypertension Mother   . Diabetes Mother   . Crohn's disease Father   . Rheum arthritis Father   . GER disease Father   . Ulcers Father   . Cataracts Sister   . Fibromyalgia Sister   . COPD Sister   . Breast cancer Sister   . GER disease Sister   . Hypothyroidism Sister   . Osteoarthritis Sister   . GER disease Brother   . GER disease Brother   . Liver cancer Brother   . Heart attack Brother   . Colon cancer Neg Hx     Social History:  Social History   Socioeconomic History  . Marital status: Single    Spouse name: Not on file  . Number of children: Not on file  . Years of education: Not on file  . Highest education level: Not on file  Occupational History  . Not on file  Tobacco Use  . Smoking status: Never Smoker  . Smokeless tobacco: Former Systems developer    Types: Chew  Substance and Sexual Activity  . Alcohol use: No    Alcohol/week: 0.0 standard drinks  . Drug use: No  . Sexual activity: Not on file  Other Topics Concern  . Not on file  Social History Narrative  . Not on file   Social Determinants of Health   Financial Resource Strain:   . Difficulty of Paying Living Expenses: Not on file  Food Insecurity:   . Worried About Charity fundraiser in the Last Year: Not on file  . Ran Out of Food in the Last Year: Not on file  Transportation Needs:   . Lack of Transportation (Medical): Not on file  . Lack of Transportation (Non-Medical): Not on file  Physical Activity:   . Days of Exercise per Week: Not on file  . Minutes of Exercise per Session: Not on file  Stress:   . Feeling of Stress : Not on file  Social Connections:   . Frequency of Communication with Friends and Family: Not on file  . Frequency of Social Gatherings with Friends and Family: Not on file  . Attends Religious Services: Not on file  . Active Member of Clubs or Organizations: Not on file  . Attends Archivist Meetings: Not on file  . Marital Status: Not  on file    Allergies:  Allergies  Allergen Reactions  . Penicillins Shortness Of Breath    "couldn't breathe" Has patient had a PCN reaction causing immediate rash, facial/tongue/throat swelling, SOB or lightheadedness with hypotension: No Has patient had a PCN reaction causing severe rash involving mucus membranes or skin necrosis: No Has patient had a PCN reaction that required hospitalization: No Has patient had a PCN reaction occurring within the last 10 years: No If all of the above answers are "NO", then may proceed with Cephalosporin use. "couldn't breathe" Has patient had a PCN reaction causing immediate rash, facial/tongue/throat swelling, SOB or lightheadedness with hypotension:  No Has patient had a PCN reaction causing severe rash involving mucus membranes or skin necrosis: No Has patient had a PCN reaction that required hospitalization: No Has patient had a PCN reaction occurring within the last 10 years: No If all of the above answers are "NO", then may proceed with Cephalosporin use.   . Bacitracin Other (See Comments)    Positive patch test  . Imidurea Other (See Comments)    Positive patch test  . Methylisothiazolinone Other (See Comments)    Positive patch test Positive patch test   . Neomycin Other (See Comments)    Positive patch test  . Polymyxin B Other (See Comments)    Positive patch test  . Pork-Derived Products     AVOIDS DUE TO RELIGIOUS BELIEFS  . Sulfa Antibiotics Nausea And Vomiting  . Urea Other (See Comments)    Positive patch test    Metabolic Disorder Labs: No results found for: HGBA1C, MPG No results found for: PROLACTIN No results found for: CHOL, TRIG, HDL, CHOLHDL, VLDL, LDLCALC No results found for: TSH  Therapeutic Level Labs: No results found for: LITHIUM No results found for: VALPROATE No components found for:  CBMZ  Current Medications: Current Outpatient Medications  Medication Sig Dispense Refill  . acetaminophen  (TYLENOL) 325 MG tablet Take 650 mg by mouth every 6 (six) hours as needed for moderate pain.    Marland Kitchen amLODipine (NORVASC) 5 MG tablet Take 5 mg by mouth daily.     Marland Kitchen aspirin 81 MG tablet Take 81 mg by mouth daily.      . cetirizine (ZYRTEC) 10 MG tablet Take 10 mg by mouth daily.     . Cholecalciferol (VITAMIN D3) 1000 units CAPS Take by mouth.    . diclofenac sodium (VOLTAREN) 1 % GEL Apply 3 grams to 3 large joints up to 3 times daily 5 Tube 3  . FLUoxetine (PROZAC) 20 MG capsule TAKE 1 CAPSULE BY MOUTH EVERY DAY IN THE MORNING WITH FOOD 30 capsule 5  . Ibuprofen 200 MG CAPS Take 1 capsule (200 mg total) by mouth 3 (three) times daily. (Patient taking differently: Take 200 mg by mouth as needed. ) 90 each 3  . lisinopril (PRINIVIL,ZESTRIL) 40 MG tablet Take 40 mg by mouth daily.     . Multiple Vitamin (MULTIVITAMIN PO) Take 1 tablet by mouth every evening.     . nabumetone (RELAFEN) 500 MG tablet Take by mouth as needed.     Marland Kitchen omeprazole (PRILOSEC) 40 MG capsule Take 40 mg by mouth daily.     . rosuvastatin (CRESTOR) 5 MG tablet rosuvastatin 5 mg tablet    . tamsulosin (FLOMAX) 0.4 MG CAPS capsule daily.    . traZODone (DESYREL) 50 MG tablet TAKE 1 TABLET BY MOUTH AT BEDTIME 31 tablet 10  . triamcinolone cream (KENALOG) 0.1 % Apply 1 application topically 3 (three) times daily.     No current facility-administered medications for this visit.    Psychiatric Specialty Exam: Review of Systems  Unable to perform ROS: Patient nonverbal    There were no vitals taken for this visit.There is no height or weight on file to calculate BMI.  General Appearance: NA  Eye Contact:  NA  Speech:  NA  Volume:  No response  Mood:  Good  Affect:  NA  Thought Process:  NA  Orientation:  Other:  unable to assess  Thought Content: NA   Suicidal Thoughts:  No  Homicidal Thoughts:  No  Memory:  Unable to test  Judgement:  NA  Insight:  NA  Psychomotor Activity:  NA  Concentration:  Concentration: NA   Recall:  NA  Fund of Knowledge: NA  Language: NA  Akathisia:  Negative  Handed:  Right  AIMS (if indicated): not done  Assets:  Physical Health Social Support  ADL's:  Intact  Cognition: WNL  Sleep:  Good     Assessment and Plan: 59 yo single white male with IDD (severity unspecified) whodevelopedinsomnia, paranoid fears of becoming infected with COVID-19 and having to go to a nursing home and daily auditory hallucinations. He has not have any of these symptoms until mid May 2020. At that time the group home he resides at was locked down - meaning residents are not allowed to go out or have visitors (because of COVID). [name]'s previously good sleep suffered - he started to sleep just few hours (his sister-guardian noticed that when he was on ahome visit), became paranoid, anxious, tearful and started to experience AH with voices occurring daily either yelling at him, making "mean comments" or telling him not to do things that he is required to do at the group home. Friend has no hx of any psychiatric problems, I.e. no hx of depression, anxiety, mood swings, psychosis.  He wasprescribed olanzapine- now for close to 5 months.I added trazodone 50 mg prn insomnia. Patientwhilestaying with his sisterstarted to sleep betterso trazodone was not used. Hewaswithdrawn, very limitred verbal output,butnot agitated andhisappetitewas normal. We have added fluoxetine 20 mg on suspicion that he has been feeling depressed/anxious - he is not a good communicator due to IDD. Sister reports that his affect brightened and he expressed feeling "like his old self". AH have fully resolved over 2 months ago.Sleep and appetite are normal per staff report. We have started to decrease dose of olanzapine as it has been making him very drowsy. He also gained close to 30 lbs on it  Per sister's report.  Dx: Psychosis unspecified - likely secondary to insomniaand or depression/stress; IDD severity  unspecified  Plan:Continue fluoxetine 20 mg daily until COVID related restrictions are lifted at his group home. Stop olanzapine. Trazodone should remain on prn basis in case sleep problems returnonce he is off olanzapine.Next appointment on as needed basis as fluoxetine/trazodone can be effectively managed by his PCP at a group home. The plan was discussed with patient'ssisterwho had an opportunity to ask questions and these were all answered. I spend1minutes inphone consultation with his POA.We will fax order to stop olanzapine to his group home attn: Medication nurse Legrand Pitts at [phone].    Stephanie Acre, MD 02/13/2019, 11:43 AM

## 2019-02-18 ENCOUNTER — Encounter (HOSPITAL_COMMUNITY): Payer: Self-pay

## 2019-03-01 DIAGNOSIS — E7849 Other hyperlipidemia: Secondary | ICD-10-CM | POA: Diagnosis not present

## 2019-03-01 DIAGNOSIS — E1169 Type 2 diabetes mellitus with other specified complication: Secondary | ICD-10-CM | POA: Diagnosis not present

## 2019-03-05 DIAGNOSIS — I10 Essential (primary) hypertension: Secondary | ICD-10-CM | POA: Diagnosis not present

## 2019-03-05 DIAGNOSIS — R4182 Altered mental status, unspecified: Secondary | ICD-10-CM | POA: Diagnosis not present

## 2019-03-05 DIAGNOSIS — E1169 Type 2 diabetes mellitus with other specified complication: Secondary | ICD-10-CM | POA: Diagnosis not present

## 2019-03-05 DIAGNOSIS — K219 Gastro-esophageal reflux disease without esophagitis: Secondary | ICD-10-CM | POA: Diagnosis not present

## 2019-03-05 DIAGNOSIS — G4700 Insomnia, unspecified: Secondary | ICD-10-CM | POA: Diagnosis not present

## 2019-03-05 DIAGNOSIS — E785 Hyperlipidemia, unspecified: Secondary | ICD-10-CM | POA: Diagnosis not present

## 2019-03-05 DIAGNOSIS — E669 Obesity, unspecified: Secondary | ICD-10-CM | POA: Diagnosis not present

## 2019-04-01 DIAGNOSIS — M170 Bilateral primary osteoarthritis of knee: Secondary | ICD-10-CM | POA: Diagnosis not present

## 2019-04-01 DIAGNOSIS — M25562 Pain in left knee: Secondary | ICD-10-CM | POA: Diagnosis not present

## 2019-04-01 DIAGNOSIS — M25561 Pain in right knee: Secondary | ICD-10-CM | POA: Diagnosis not present

## 2019-04-23 ENCOUNTER — Ambulatory Visit: Payer: Medicare HMO | Admitting: Podiatry

## 2019-04-30 ENCOUNTER — Ambulatory Visit (INDEPENDENT_AMBULATORY_CARE_PROVIDER_SITE_OTHER): Payer: Medicare HMO | Admitting: Podiatry

## 2019-04-30 ENCOUNTER — Encounter: Payer: Self-pay | Admitting: Podiatry

## 2019-04-30 ENCOUNTER — Other Ambulatory Visit: Payer: Self-pay

## 2019-04-30 ENCOUNTER — Ambulatory Visit: Payer: Medicare HMO | Admitting: Orthotics

## 2019-04-30 VITALS — Temp 97.6°F

## 2019-04-30 DIAGNOSIS — E0851 Diabetes mellitus due to underlying condition with diabetic peripheral angiopathy without gangrene: Secondary | ICD-10-CM | POA: Diagnosis not present

## 2019-04-30 DIAGNOSIS — M79676 Pain in unspecified toe(s): Secondary | ICD-10-CM | POA: Diagnosis not present

## 2019-04-30 DIAGNOSIS — B351 Tinea unguium: Secondary | ICD-10-CM | POA: Diagnosis not present

## 2019-04-30 DIAGNOSIS — L84 Corns and callosities: Secondary | ICD-10-CM

## 2019-04-30 NOTE — Progress Notes (Signed)
This patient returns to my office for at risk foot care.  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  This patient is unable to cut nails himself since the patient cannot reach his nails.These nails are painful walking and wearing shoes. Patient also has painful callus since he is unable to self treat.  This patient presents for at risk foot care today.  General Appearance  Alert, conversant and in no acute stress.  Vascular  Dorsalis pedis and posterior tibial  pulses are not  palpable  bilaterally.  Capillary return is within normal limits  bilaterally. Temperature is within normal limits  bilaterally.  Neurologic  Senn-Weinstein monofilament wire test diminished   bilaterally. Muscle power within normal limits bilaterally.  Nails Thick disfigured discolored nails with subungual debris  from hallux to fifth toes bilaterally. No evidence of bacterial infection or drainage bilaterally.  Orthopedic  No limitations of motion  feet .  No crepitus or effusions noted.  No bony pathology or digital deformities noted. 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  Skin  normotropic skin  noted bilaterally.  No signs of infections or ulcers noted.   Porokeratosis medial plantar aspect right.  Onychomycosis  Pain in right toes  Pain in left toes  Porokeratosis right foot.  Consent was obtained for treatment procedures.   Mechanical debridement of nails 1-5  bilaterally performed with a nail nipper.  Filed with dremel without incident.  Debride callus with # 15 blade.  No infection or ulcer.     Return office visit   3 months                  Told patient to return for periodic foot care and evaluation due to potential at risk complications.   Gardiner Barefoot DPM

## 2019-05-27 ENCOUNTER — Ambulatory Visit: Payer: Medicare HMO | Admitting: Orthotics

## 2019-05-27 ENCOUNTER — Other Ambulatory Visit: Payer: Self-pay

## 2019-05-27 DIAGNOSIS — R3915 Urgency of urination: Secondary | ICD-10-CM | POA: Diagnosis not present

## 2019-05-27 DIAGNOSIS — N138 Other obstructive and reflux uropathy: Secondary | ICD-10-CM | POA: Diagnosis not present

## 2019-05-27 DIAGNOSIS — N401 Enlarged prostate with lower urinary tract symptoms: Secondary | ICD-10-CM | POA: Diagnosis not present

## 2019-05-27 DIAGNOSIS — R338 Other retention of urine: Secondary | ICD-10-CM | POA: Diagnosis not present

## 2019-05-27 DIAGNOSIS — L84 Corns and callosities: Secondary | ICD-10-CM

## 2019-05-27 DIAGNOSIS — B351 Tinea unguium: Secondary | ICD-10-CM

## 2019-05-27 NOTE — Progress Notes (Signed)
Patient was measured for med necessity CUSTOM DBS SHOES custom f/o. Patient was measured w/ brannock to determine size and width.  Foam impression mold was achieved and deemed appropriate for fabrication of  cmfo.   See DPM chart notes for further documentation and dx codes for determination of medical necessity.  Appropriate forms will be sent to PCP to verify and sign off on medical necessity.

## 2019-05-28 DIAGNOSIS — E559 Vitamin D deficiency, unspecified: Secondary | ICD-10-CM | POA: Diagnosis not present

## 2019-05-28 DIAGNOSIS — I10 Essential (primary) hypertension: Secondary | ICD-10-CM | POA: Diagnosis not present

## 2019-05-28 DIAGNOSIS — E669 Obesity, unspecified: Secondary | ICD-10-CM | POA: Diagnosis not present

## 2019-05-28 DIAGNOSIS — E785 Hyperlipidemia, unspecified: Secondary | ICD-10-CM | POA: Diagnosis not present

## 2019-05-28 DIAGNOSIS — E1169 Type 2 diabetes mellitus with other specified complication: Secondary | ICD-10-CM | POA: Diagnosis not present

## 2019-05-28 DIAGNOSIS — R3915 Urgency of urination: Secondary | ICD-10-CM | POA: Diagnosis not present

## 2019-06-22 IMAGING — US US ABDOMEN LIMITED
1 series · 14 of 25 positions shown · non-contrast
Comparison: MRI 07/30/2010.

CLINICAL DATA: Elevated LFTs.

EXAM:
ULTRASOUND ABDOMEN LIMITED RIGHT UPPER QUADRANT

[Series 1: us abdomen limited · 0.18mm/px · 14 of 45 slices shown]
[im 1/45]
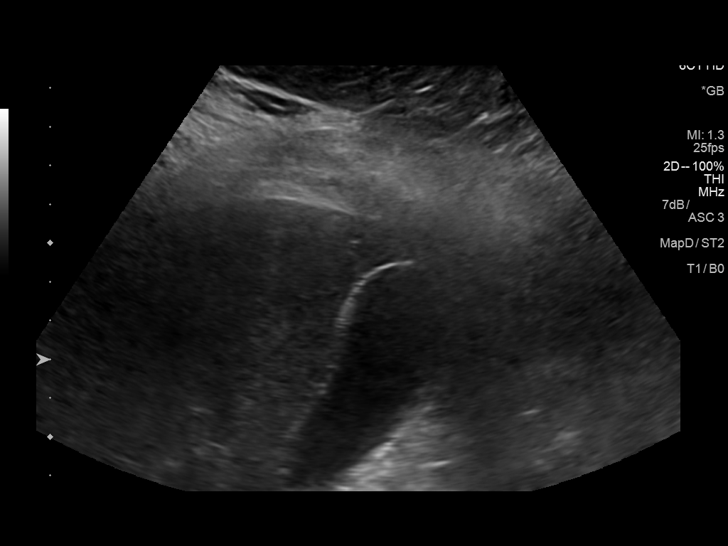
[im 4/45]
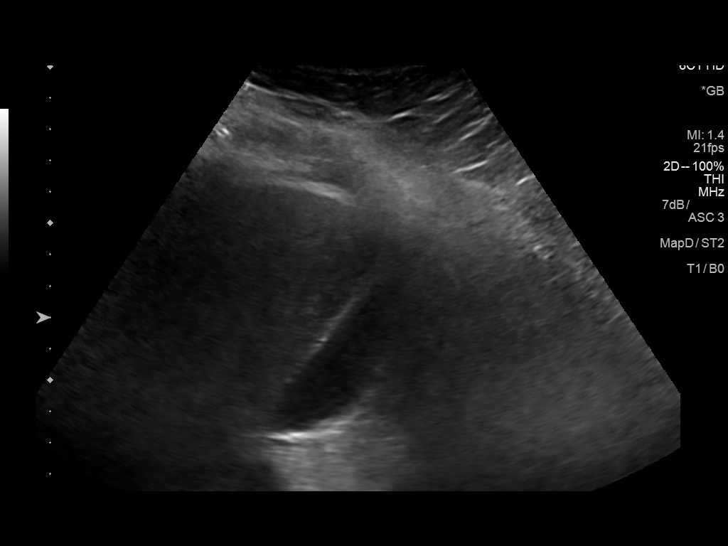
[im 8/45]
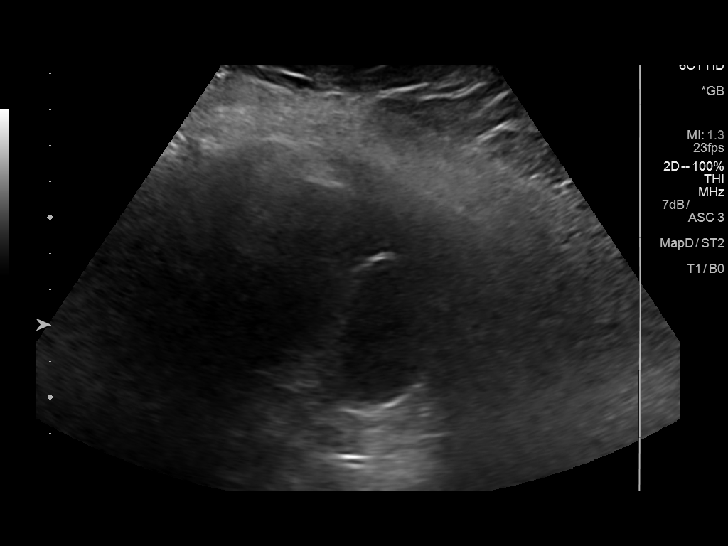
[im 12/45]
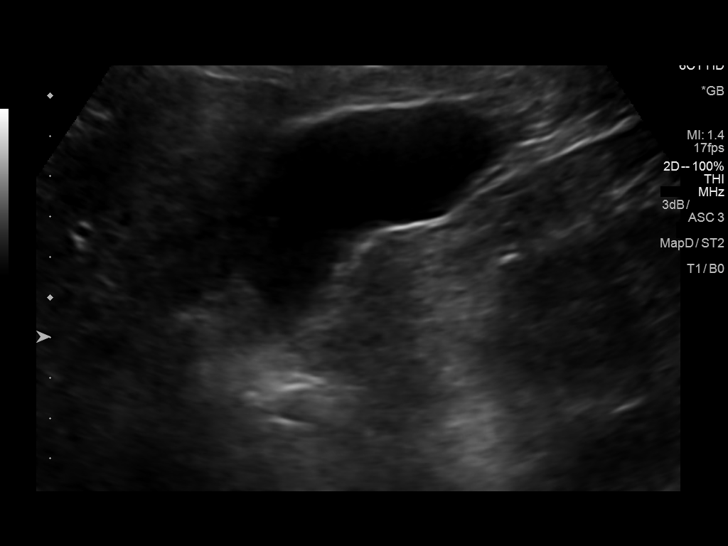
[im 15/45]
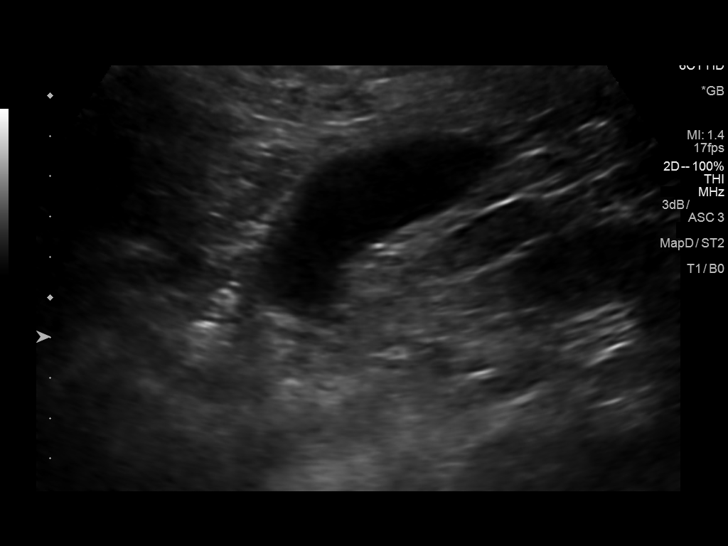
[im 17/45]
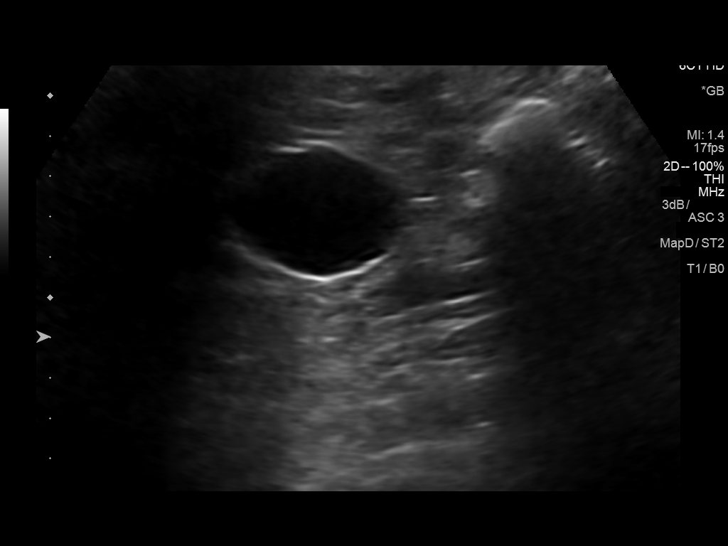
[im 21/45]
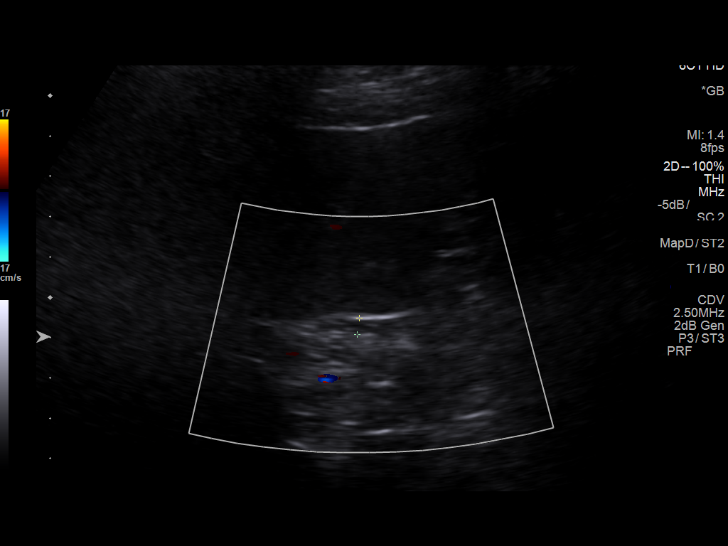
[im 24/45]
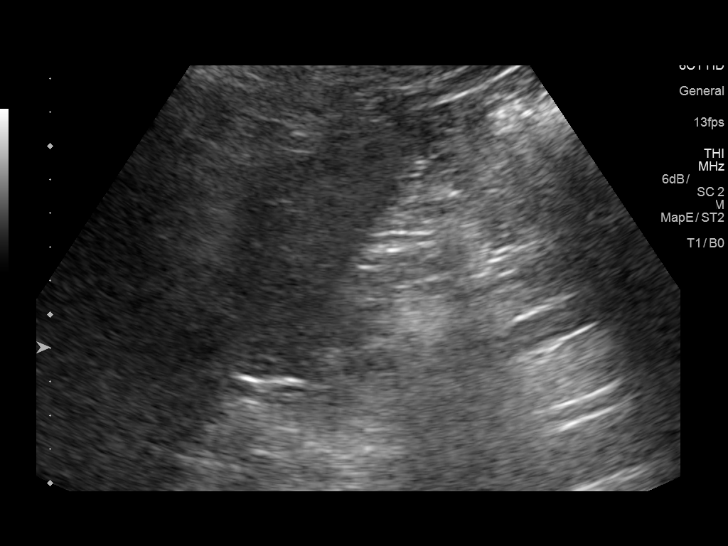
[im 28/45]
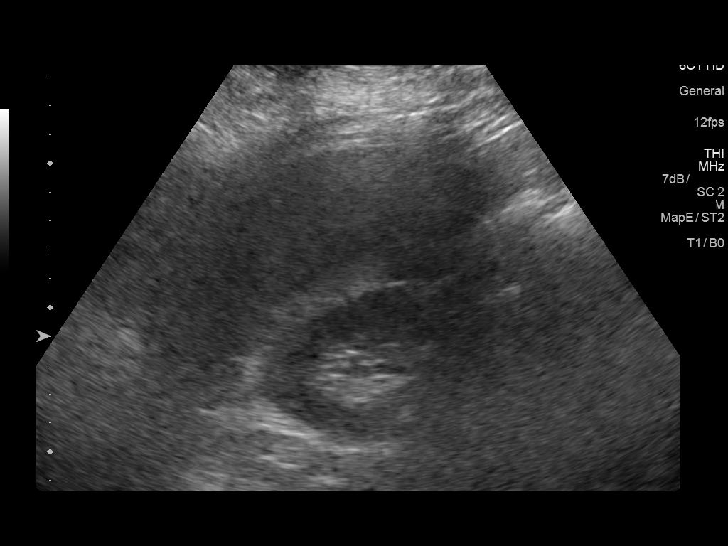
[im 30/45]
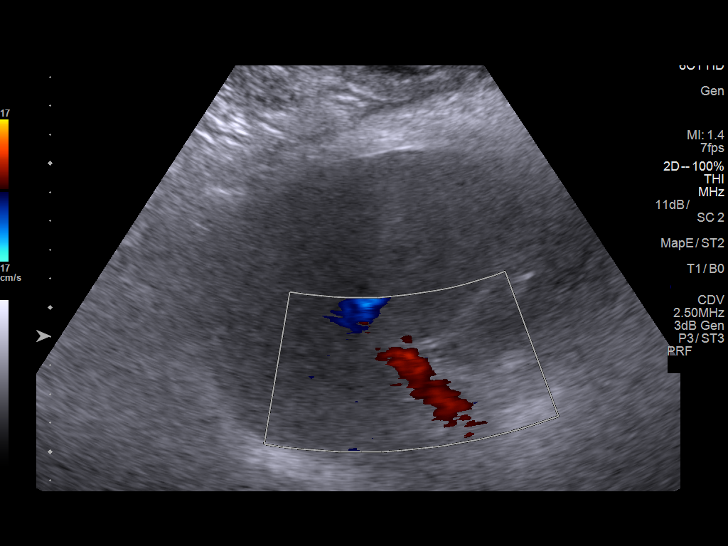
[im 34/45]
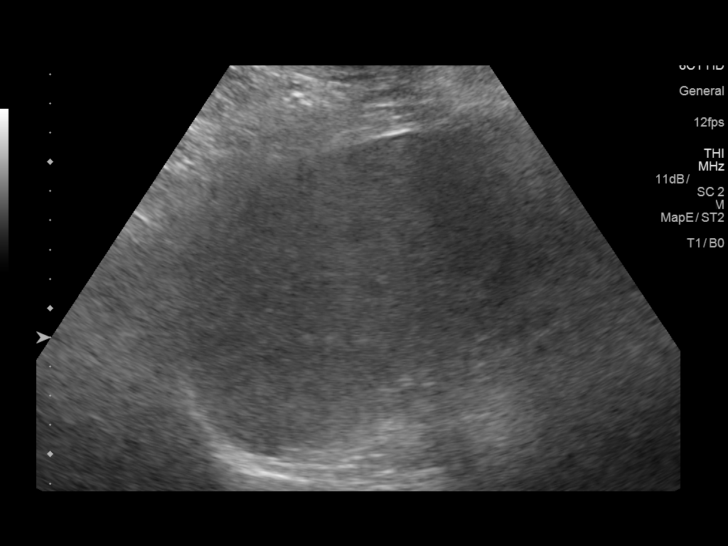
[im 37/45]
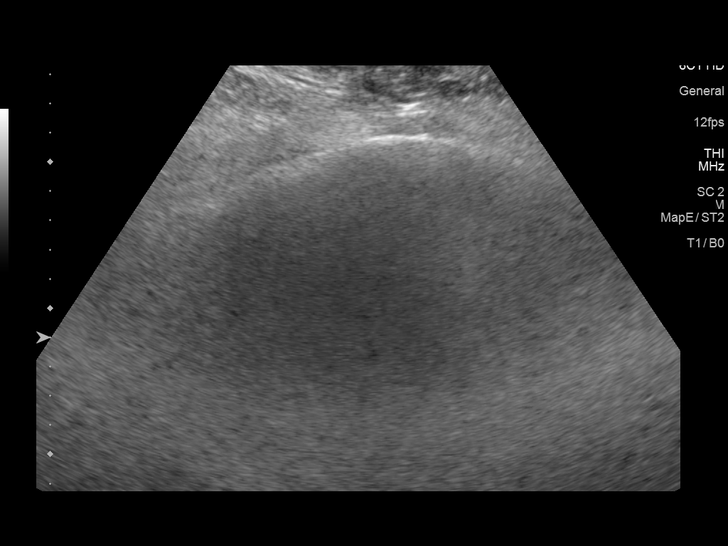
[im 41/45]
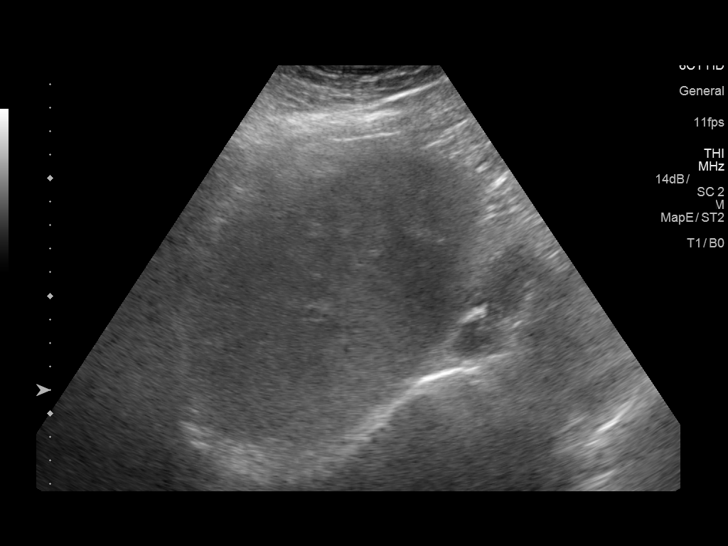
[im 45/45]
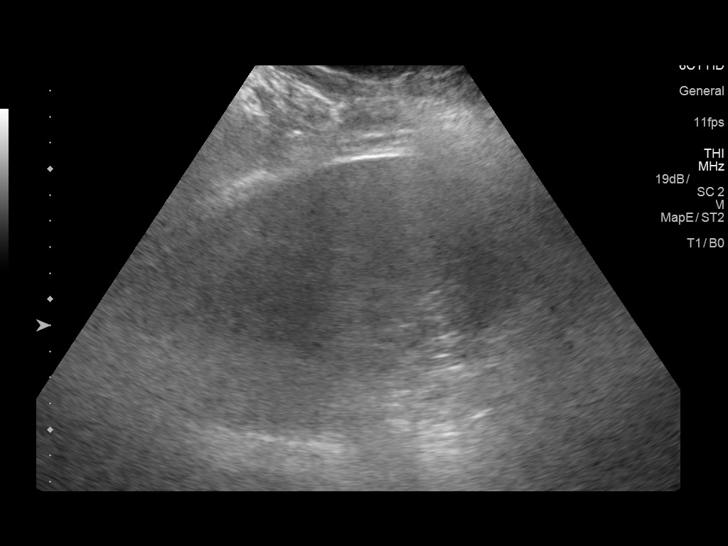

[14 of 25 positions shown; findings below may reference images not displayed]

FINDINGS: Gallbladder:

No gallstones or wall thickening visualized. No sonographic Murphy
sign noted by sonographer.

Common bile duct:

Diameter: 4.1 mm

Liver:

Increase echogenicity consistent fatty infiltration and/or
hepatocellular disease. No focal hepatic abnormality identified.
Portal vein is patent on color Doppler imaging with normal direction
of blood flow towards the liver.
IMPRESSION: 1. Increase echogenicity consistent fatty infiltration and/or
hepatocellular disease. No focal hepatic abnormality identified.

2.  No gallstones or biliary distention .

## 2019-07-30 ENCOUNTER — Other Ambulatory Visit: Payer: Self-pay

## 2019-07-30 ENCOUNTER — Encounter: Payer: Self-pay | Admitting: Podiatry

## 2019-07-30 ENCOUNTER — Ambulatory Visit (INDEPENDENT_AMBULATORY_CARE_PROVIDER_SITE_OTHER): Payer: Medicare HMO | Admitting: Podiatry

## 2019-07-30 DIAGNOSIS — M79676 Pain in unspecified toe(s): Secondary | ICD-10-CM

## 2019-07-30 DIAGNOSIS — B351 Tinea unguium: Secondary | ICD-10-CM

## 2019-07-30 DIAGNOSIS — E0851 Diabetes mellitus due to underlying condition with diabetic peripheral angiopathy without gangrene: Secondary | ICD-10-CM | POA: Diagnosis not present

## 2019-07-30 DIAGNOSIS — L84 Corns and callosities: Secondary | ICD-10-CM | POA: Diagnosis not present

## 2019-07-30 DIAGNOSIS — M19071 Primary osteoarthritis, right ankle and foot: Secondary | ICD-10-CM

## 2019-07-30 DIAGNOSIS — M129 Arthropathy, unspecified: Secondary | ICD-10-CM

## 2019-07-30 NOTE — Progress Notes (Signed)
This patient returns to my office for at risk foot care.  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  This patient is unable to cut nails himself since the patient cannot reach his nails.These nails are painful walking and wearing shoes. Patient also has painful callus since he is unable to self treat.  This patient presents for at risk foot care today.  General Appearance  Alert, conversant and in no acute stress.  Vascular  Dorsalis pedis and posterior tibial  pulses are not  palpable  bilaterally.  Capillary return is within normal limits  bilaterally. Temperature is within normal limits  bilaterally.  Neurologic  Senn-Weinstein monofilament wire test diminished   bilaterally. Muscle power within normal limits bilaterally.  Nails Thick disfigured discolored nails with subungual debris  from hallux to fifth toes bilaterally. No evidence of bacterial infection or drainage bilaterally.  Orthopedic  No limitations of motion  feet .  No crepitus or effusions noted.  No bony pathology or digital deformities noted. 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  Skin  normotropic skin  noted bilaterally.  No signs of infections or ulcers noted.   Porokeratosis medial plantar aspect right.  Callus left hallux.  Callus midfoot right.  Onychomycosis  Pain in right toes  Pain in left toes  Porokeratosis right foot.  Consent was obtained for treatment procedures.   Mechanical debridement of nails 1-5  bilaterally performed with a nail nipper.  Filed with dremel without incident.  Debride callus with # 15 blade.  No infection or ulcer.     Return office visit   3 months                  Told patient to return for periodic foot care and evaluation due to potential at risk complications.   Gardiner Barefoot DPM

## 2019-08-20 ENCOUNTER — Ambulatory Visit (INDEPENDENT_AMBULATORY_CARE_PROVIDER_SITE_OTHER): Payer: Medicare HMO | Admitting: Orthotics

## 2019-08-20 ENCOUNTER — Other Ambulatory Visit: Payer: Self-pay

## 2019-08-20 DIAGNOSIS — L84 Corns and callosities: Secondary | ICD-10-CM

## 2019-08-20 DIAGNOSIS — B351 Tinea unguium: Secondary | ICD-10-CM | POA: Diagnosis not present

## 2019-08-20 DIAGNOSIS — E0851 Diabetes mellitus due to underlying condition with diabetic peripheral angiopathy without gangrene: Secondary | ICD-10-CM | POA: Diagnosis not present

## 2019-08-20 DIAGNOSIS — M12072 Chronic postrheumatic arthropathy [Jaccoud], left ankle and foot: Secondary | ICD-10-CM

## 2019-08-20 DIAGNOSIS — M79676 Pain in unspecified toe(s): Secondary | ICD-10-CM

## 2019-08-20 DIAGNOSIS — M12071 Chronic postrheumatic arthropathy [Jaccoud], right ankle and foot: Secondary | ICD-10-CM

## 2019-08-20 DIAGNOSIS — L309 Dermatitis, unspecified: Secondary | ICD-10-CM | POA: Diagnosis not present

## 2019-08-20 DIAGNOSIS — M779 Enthesopathy, unspecified: Secondary | ICD-10-CM

## 2019-08-20 DIAGNOSIS — M129 Arthropathy, unspecified: Secondary | ICD-10-CM

## 2019-08-20 DIAGNOSIS — M19071 Primary osteoarthritis, right ankle and foot: Secondary | ICD-10-CM

## 2019-08-20 DIAGNOSIS — F79 Unspecified intellectual disabilities: Secondary | ICD-10-CM | POA: Diagnosis not present

## 2019-08-21 NOTE — Progress Notes (Signed)
Patient came into pick up CUSTOM DBS and two pair of CUSTOM DBS inserts. Good fit.

## 2019-09-10 NOTE — Progress Notes (Signed)
Office Visit Note  Patient: Arthur Weaver             Date of Birth: 01/22/1961           MRN: [id number]             PCP: Velna Hatchet, MD Referring: Velna Hatchet, MD Visit Date: 09/24/2019 Occupation: @GUAROCC @  Subjective:  Psoriasis   History of Present Illness: Arthur Weaver is a 59 y.o. male with history of osteoarthritis and DDD.  His sister acts as the primary historian.  He was recently diagnosed with psoriasis by his PCP and has been using triamcinolone topically as needed.  Triamcinolone has been effective and he would like a refill.  He denies any increased joint pain or inflammation.  He continues to have chronic pain and stiffness in both knees.  He had cortisone injections performed at [facility] in the past.  He has been using a stationary bike for exercise.  He continues to use Voltaren gel topically as needed for pain relief he requires a refill Voltaren gel as well.    Activities of Daily Living:  Patient reports morning stiffness for  10-20 minutes.   Patient Denies nocturnal pain.  Difficulty dressing/grooming: Denies Difficulty climbing stairs: Reports Difficulty getting out of chair: Reports Difficulty using hands for taps, buttons, cutlery, and/or writing: Reports  Review of Systems  Constitutional: Positive for fatigue. Negative for night sweats.  HENT: Negative for mouth sores, mouth dryness and nose dryness.   Eyes: Positive for dryness. Negative for redness.  Respiratory: Negative for shortness of breath and difficulty breathing.   Cardiovascular: Negative for chest pain, palpitations, hypertension, irregular heartbeat and swelling in legs/feet.  Gastrointestinal: Negative for blood in stool, constipation and diarrhea.  Genitourinary: Negative for difficulty urinating and painful urination.  Musculoskeletal: Positive for arthralgias, joint pain, joint swelling and morning stiffness. Negative for myalgias, muscle weakness, muscle  tenderness and myalgias.  Skin: Positive for rash. Negative for color change, hair loss, nodules/bumps, skin tightness, ulcers and sensitivity to sunlight.  Allergic/Immunologic: Negative for susceptible to infections.  Neurological: Positive for headaches and weakness. Negative for dizziness, fainting, numbness, memory loss and night sweats.  Hematological: Positive for bruising/bleeding tendency. Negative for swollen glands.  Psychiatric/Behavioral: Negative for depressed mood and sleep disturbance. The patient is not nervous/anxious.     PMFS History:  Patient Active Problem List   Diagnosis Date Noted  . Chronic arthropathy 07/30/2019  . Psychosis ([place]) 09/27/2018  . Intellectual disability 09/27/2018  . 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 Tendonitis 09/25/2015  . High blood pressure 09/07/2010  . High cholesterol 09/07/2010  . Bilateral swelling of feet 09/07/2010  . Diabetes mellitus 09/07/2010  . Rheumatoid arthritis(714.0) 09/07/2010  . Personal history of colonic polyps 10/27/2004    Past Medical History:  Diagnosis Date  . At risk for sleep apnea    STOP-BANG= 5       SENT TO PCP 04-14-2016  . BPH (benign prostatic hyperplasia)   . DDD (degenerative disc disease), cervical   . DDD (degenerative disc disease), lumbar   . Diverticulosis of colon   . Family history of factor V deficiency    per pt's sister (whom is pt's guardian) she, pt's brother and parents have factor V but pt has never been tested  . Frequency of urination   . GERD (gastroesophageal reflux disease)   . History of adenomatous polyp of colon    2006 and 03-18-2010 tubular adenoma  . Hyperlipidemia   .  Hypertension   . Lives in independent group home resides at [facility] in [place], [place]   pt independant w/ ADLs w/ exception does not cook for shop   . Mentally challenged    SPECIAL NEEDS  . OA (osteoarthritis)    hands, knees, feet  . Pre-diabetes   . Psoriasis    per  patient's sister  . Thoracic spondylosis   . Urgency of urination   . Wears partial dentures    lower    Family History  Problem Relation Age of Onset  . Breast cancer Mother   . Brain cancer Mother   . Hypertension Mother   . Diabetes Mother   . Crohn's disease Father   . Rheum arthritis Father   . GER disease Father   . Ulcers Father   . Cataracts Sister   . Fibromyalgia Sister   . COPD Sister   . Breast cancer Sister   . GER disease Sister   . Hypothyroidism Sister   . Osteoarthritis Sister   . GER disease Brother   . GER disease Brother   . Stroke Brother   . Liver cancer Brother   . Heart attack Brother   . Colon cancer Neg Hx    Past Surgical History:  Procedure Laterality Date  . CATARACT EXTRACTION W/ INTRAOCULAR LENS  IMPLANT, BILATERAL  2006  . CIRCUMCISION  03/29/2007   W/  CYSTOSCOPY AND TRANSRECTAL ULTRASOUND GUIDED PROSTATE BX'S  . COLONOSCOPY  last one 08-26-2015  . THULIUM LASER TURP (TRANSURETHRAL RESECTION OF PROSTATE) N/A 04/21/2016   Procedure: THULIUM LASER TURP (TRANSURETHRAL RESECTION OF PROSTATE);  Surgeon: Carolan Clines, MD;  Location: [facility];  Service: Urology;  Laterality: N/A;   Social History   Social History Narrative  . Not on file   There is no immunization history for the selected administration types on file for this patient.   Objective: Vital Signs: BP 139/88 (BP Location: Right Arm, Patient Position: Sitting, Cuff Size: Normal)   Pulse 88   Resp 17   Ht 5\' 7"  (1.702 m)   Wt 212 lb (96.2 kg)   BMI 33.20 kg/m    Physical Exam Vitals and nursing note reviewed.  Constitutional:      Appearance: He is well-developed.  HENT:     Head: Normocephalic and atraumatic.  Eyes:     Conjunctiva/sclera: Conjunctivae normal.     Pupils: Pupils are equal, round, and reactive to light.  Pulmonary:     Effort: Pulmonary effort is normal.  Abdominal:     General: Bowel sounds are normal.     Palpations:  Abdomen is soft.  Musculoskeletal:     Cervical back: Normal range of motion and neck supple.  Skin:    General: Skin is warm and dry.     Capillary Refill: Capillary refill takes less than 2 seconds.  Neurological:     Mental Status: He is alert and oriented to person, place, and time.  Psychiatric:        Behavior: Behavior normal.      Musculoskeletal Exam:   C-spine limited ROM. Shoulder joints, elbow joints, wrist joints, MCPs, PIPs, and DIPs good ROM with no synovitis.  PIP and DIP thickening consistent with osteoarthritis.  Hip joints good ROM.  Valgus deformity of both knees, L>R.  Bilateral knee crepitus.  No warmth or effusion of knee joints noted.  Thickening of both ankle joints noted.  Pedal edema bilaterally.    CDAI  Exam: CDAI Score: -- Patient Global: --; Provider Global: -- Swollen: --; Tender: -- Joint Exam 09/24/2019   No joint exam has been documented for this visit   There is currently no information documented on the homunculus. Go to the Rheumatology activity and complete the homunculus joint exam.  Investigation: No additional findings.  Imaging: No results found.  Recent Labs: Lab Results  Component Value Date   WBC 23.8 (H) 10/08/2016   HGB 16.8 10/08/2016   PLT 281 10/08/2016   NA 138 10/08/2016   K 3.9 10/08/2016   CL 106 10/08/2016   CO2 18 (L) 10/08/2016   GLUCOSE 154 (H) 10/08/2016   BUN 15 10/08/2016   CREATININE 0.99 10/08/2016   CALCIUM 9.2 10/08/2016   GFRAA >60 10/08/2016    Speciality Comments: No specialty comments available.  Procedures:  No procedures performed Allergies: Penicillins, Bacitracin, Imidurea, Methylisothiazolinone, Neomycin, Polymyxin b, Pork-derived products, Sulfa antibiotics, and Urea   Assessment / Plan:     Visit Diagnoses: Primary osteoarthritis of both hands: He has PIP and DIP thickening consistent with osteoarthritis of both hands.  No synovitis or dactylitis noted that would raise the suspicion for  psoriatic arthritis. He experiences stiffness in both hands and some difficulty with ADLs due to the discomfort.  He uses Voltaren gel topically as needed for pain relief.  Joint protection and muscle strengthening were discussed.  Primary osteoarthritis of both knees: Chronic pain.  He has valgus deformity of both knee joints, left greater than right.  Bilateral knee crepitus noted.  No warmth or effusion was noted.  He has been cycling on a daily basis which has helped with lower extremity muscle strengthening and has noticed some weight loss.  He has had cortisone injections performed at [facility] in the past which were helpful.  He has been using Voltaren gel topically on a daily basis for pain relief.  We discussed the importance of exercise and lower extremity strengthening.  Primary osteoarthritis of both feet: Chronic pain.  He is followed by a podiatrist every 3 months.  Lateral epicondylitis, left elbow - Resolved   DDD (degenerative disc disease), thoracic: No midline spinal tenderness.  DDD (degenerative disc disease), lumbar: He is not having any discomfort in his lower back at this time.  Psoriasis: He was diagnosed with psoriasis by his PCP and has been using triamcinolone 0.1% cream topically.  He has noticed a significant improvement in the patches of psoriasis and would like a refill of triamcinolone. He has no signs of psoriatic arthritis at this time.  No synovitis or dactylitis was noted on exam.  Other medical conditions are listed as follows:  Leg length discrepancy  History of diabetes mellitus  History of hypertension  History of hypercholesterolemia  Orders: No orders of the defined types were placed in this encounter.  Meds ordered this encounter  Medications  . triamcinolone cream (KENALOG) 0.1 %    Sig: Apply 1 application topically 2 (two) times daily as needed.    Dispense:  453.6 g    Refill:  0  . diclofenac Sodium (VOLTAREN) 1 % GEL     Sig: Apply 2 g to 4 g to the affected area up to 4 times daily as needed.    Dispense:  400 g    Refill:  4    Face-to-face time spent with patient was 30 minutes. Greater than 50% of time was spent in counseling and coordination of care.  Follow-Up Instructions: Return in about 1  year (around 09/23/2020) for Osteoarthritis, DDD.   Hazel Sams, PA-C  I examined and evaluated the patient with Hazel Sams PA. He had osteoarthritic changes but no synovitis on my examination. The plan of care was discussed as noted above.  Bo Merino, MD  Note - This record has been created using Editor, commissioning.  Chart creation errors have been sought, but may not always  have been located. Such creation errors do not reflect on  the standard of medical care.

## 2019-09-24 ENCOUNTER — Other Ambulatory Visit: Payer: Self-pay

## 2019-09-24 ENCOUNTER — Encounter: Payer: Self-pay | Admitting: Rheumatology

## 2019-09-24 ENCOUNTER — Ambulatory Visit (INDEPENDENT_AMBULATORY_CARE_PROVIDER_SITE_OTHER): Payer: Medicare HMO | Admitting: Rheumatology

## 2019-09-24 VITALS — BP 139/88 | HR 88 | Resp 17 | Ht 67.0 in | Wt 212.0 lb

## 2019-09-24 DIAGNOSIS — M5136 Other intervertebral disc degeneration, lumbar region: Secondary | ICD-10-CM | POA: Diagnosis not present

## 2019-09-24 DIAGNOSIS — M19041 Primary osteoarthritis, right hand: Secondary | ICD-10-CM

## 2019-09-24 DIAGNOSIS — M7712 Lateral epicondylitis, left elbow: Secondary | ICD-10-CM

## 2019-09-24 DIAGNOSIS — M170 Bilateral primary osteoarthritis of knee: Secondary | ICD-10-CM

## 2019-09-24 DIAGNOSIS — Z8679 Personal history of other diseases of the circulatory system: Secondary | ICD-10-CM

## 2019-09-24 DIAGNOSIS — M5134 Other intervertebral disc degeneration, thoracic region: Secondary | ICD-10-CM | POA: Diagnosis not present

## 2019-09-24 DIAGNOSIS — L409 Psoriasis, unspecified: Secondary | ICD-10-CM | POA: Diagnosis not present

## 2019-09-24 DIAGNOSIS — M19071 Primary osteoarthritis, right ankle and foot: Secondary | ICD-10-CM | POA: Diagnosis not present

## 2019-09-24 DIAGNOSIS — Z8639 Personal history of other endocrine, nutritional and metabolic disease: Secondary | ICD-10-CM | POA: Diagnosis not present

## 2019-09-24 DIAGNOSIS — M51369 Other intervertebral disc degeneration, lumbar region without mention of lumbar back pain or lower extremity pain: Secondary | ICD-10-CM

## 2019-09-24 DIAGNOSIS — M19042 Primary osteoarthritis, left hand: Secondary | ICD-10-CM

## 2019-09-24 DIAGNOSIS — M2170 Unequal limb length (acquired), unspecified site: Secondary | ICD-10-CM | POA: Diagnosis not present

## 2019-09-24 DIAGNOSIS — M19072 Primary osteoarthritis, left ankle and foot: Secondary | ICD-10-CM

## 2019-09-24 MED ORDER — TRIAMCINOLONE ACETONIDE 0.1 % EX CREA: 1.0000 "application " | TOPICAL_CREAM | Freq: Two times a day (BID) | CUTANEOUS | 0 refills | Status: DC | PRN

## 2019-09-24 MED ORDER — DICLOFENAC SODIUM 1 % EX GEL: CUTANEOUS | 4 refills | Status: DC

## 2019-10-25 ENCOUNTER — Encounter: Payer: Self-pay | Admitting: Podiatry

## 2019-10-25 ENCOUNTER — Other Ambulatory Visit: Payer: Self-pay

## 2019-10-25 ENCOUNTER — Ambulatory Visit (INDEPENDENT_AMBULATORY_CARE_PROVIDER_SITE_OTHER): Payer: Medicare HMO | Admitting: Podiatry

## 2019-10-25 DIAGNOSIS — E0851 Diabetes mellitus due to underlying condition with diabetic peripheral angiopathy without gangrene: Secondary | ICD-10-CM | POA: Diagnosis not present

## 2019-10-25 DIAGNOSIS — L84 Corns and callosities: Secondary | ICD-10-CM

## 2019-10-25 DIAGNOSIS — M79676 Pain in unspecified toe(s): Secondary | ICD-10-CM

## 2019-10-25 DIAGNOSIS — M129 Arthropathy, unspecified: Secondary | ICD-10-CM

## 2019-10-25 DIAGNOSIS — B351 Tinea unguium: Secondary | ICD-10-CM

## 2019-10-25 NOTE — Progress Notes (Signed)
This patient returns to my office for at risk foot care.  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  This patient is unable to cut nails himself since the patient cannot reach his nails.These nails are painful walking and wearing shoes. Patient also has painful callus since he is unable to self treat.  This patient presents for at risk foot care today.  General Appearance  Alert, conversant and in no acute stress.  Vascular  Dorsalis pedis and posterior tibial  pulses are not  palpable  bilaterally.  Capillary return is within normal limits  bilaterally. Temperature is within normal limits  bilaterally.  Neurologic  Senn-Weinstein monofilament wire test diminished   bilaterally. Muscle power within normal limits bilaterally.  Nails Thick disfigured discolored nails with subungual debris  from hallux to fifth toes bilaterally. No evidence of bacterial infection or drainage bilaterally.  Orthopedic  No limitations of motion  feet .  No crepitus or effusions noted.  No bony pathology or digital deformities noted. 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  Skin  normotropic skin  noted bilaterally.  No signs of infections or ulcers noted.   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  Callus left hallux.  Callus midfoot right.  Onychomycosis  Pain in right toes  Pain in left toes  Porokeratosis B/L.  Consent was obtained for treatment procedures.   Mechanical debridement of nails 1-5  bilaterally performed with a nail nipper.  Filed with dremel without incident.  Debride callus with # 15 blade.  No infection or ulcer.     Return office visit   3 months                  Told patient to return for periodic foot care and evaluation due to potential at risk complications.   Gardiner Barefoot DPM

## 2019-10-31 DIAGNOSIS — Z961 Presence of intraocular lens: Secondary | ICD-10-CM | POA: Diagnosis not present

## 2019-10-31 DIAGNOSIS — H04123 Dry eye syndrome of bilateral lacrimal glands: Secondary | ICD-10-CM | POA: Diagnosis not present

## 2019-10-31 DIAGNOSIS — E119 Type 2 diabetes mellitus without complications: Secondary | ICD-10-CM | POA: Diagnosis not present

## 2019-11-04 DIAGNOSIS — Z125 Encounter for screening for malignant neoplasm of prostate: Secondary | ICD-10-CM | POA: Diagnosis not present

## 2019-11-04 DIAGNOSIS — E785 Hyperlipidemia, unspecified: Secondary | ICD-10-CM | POA: Diagnosis not present

## 2019-11-04 DIAGNOSIS — E559 Vitamin D deficiency, unspecified: Secondary | ICD-10-CM | POA: Diagnosis not present

## 2019-11-04 DIAGNOSIS — E1169 Type 2 diabetes mellitus with other specified complication: Secondary | ICD-10-CM | POA: Diagnosis not present

## 2019-11-11 DIAGNOSIS — E559 Vitamin D deficiency, unspecified: Secondary | ICD-10-CM | POA: Diagnosis not present

## 2019-11-11 DIAGNOSIS — R82998 Other abnormal findings in urine: Secondary | ICD-10-CM | POA: Diagnosis not present

## 2019-11-11 DIAGNOSIS — I10 Essential (primary) hypertension: Secondary | ICD-10-CM | POA: Diagnosis not present

## 2019-11-11 DIAGNOSIS — M1990 Unspecified osteoarthritis, unspecified site: Secondary | ICD-10-CM | POA: Diagnosis not present

## 2019-11-11 DIAGNOSIS — Z23 Encounter for immunization: Secondary | ICD-10-CM | POA: Diagnosis not present

## 2019-11-11 DIAGNOSIS — Z0000 Encounter for general adult medical examination without abnormal findings: Secondary | ICD-10-CM | POA: Diagnosis not present

## 2019-11-11 DIAGNOSIS — E1169 Type 2 diabetes mellitus with other specified complication: Secondary | ICD-10-CM | POA: Diagnosis not present

## 2019-11-11 DIAGNOSIS — G4700 Insomnia, unspecified: Secondary | ICD-10-CM | POA: Diagnosis not present

## 2019-11-11 DIAGNOSIS — E785 Hyperlipidemia, unspecified: Secondary | ICD-10-CM | POA: Diagnosis not present

## 2019-11-25 DIAGNOSIS — R339 Retention of urine, unspecified: Secondary | ICD-10-CM | POA: Diagnosis not present

## 2019-11-25 DIAGNOSIS — N138 Other obstructive and reflux uropathy: Secondary | ICD-10-CM | POA: Diagnosis not present

## 2019-11-25 DIAGNOSIS — N401 Enlarged prostate with lower urinary tract symptoms: Secondary | ICD-10-CM | POA: Diagnosis not present

## 2019-11-25 DIAGNOSIS — N312 Flaccid neuropathic bladder, not elsewhere classified: Secondary | ICD-10-CM | POA: Diagnosis not present

## 2020-01-23 DIAGNOSIS — Z1212 Encounter for screening for malignant neoplasm of rectum: Secondary | ICD-10-CM | POA: Diagnosis not present

## 2020-01-24 ENCOUNTER — Other Ambulatory Visit: Payer: Self-pay

## 2020-01-24 ENCOUNTER — Encounter: Payer: Self-pay | Admitting: Podiatry

## 2020-01-24 ENCOUNTER — Ambulatory Visit (INDEPENDENT_AMBULATORY_CARE_PROVIDER_SITE_OTHER): Payer: Medicare HMO | Admitting: Podiatry

## 2020-01-24 DIAGNOSIS — M129 Arthropathy, unspecified: Secondary | ICD-10-CM

## 2020-01-24 DIAGNOSIS — B351 Tinea unguium: Secondary | ICD-10-CM

## 2020-01-24 DIAGNOSIS — E0851 Diabetes mellitus due to underlying condition with diabetic peripheral angiopathy without gangrene: Secondary | ICD-10-CM | POA: Diagnosis not present

## 2020-01-24 DIAGNOSIS — M79676 Pain in unspecified toe(s): Secondary | ICD-10-CM

## 2020-01-24 DIAGNOSIS — L84 Corns and callosities: Secondary | ICD-10-CM | POA: Diagnosis not present

## 2020-01-24 NOTE — Progress Notes (Signed)
This patient returns to my office for at risk foot care.  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  This patient is unable to cut nails himself since the patient cannot reach his nails.These nails are painful walking and wearing shoes. Patient also has painful callus since he is unable to self treat.  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  This patient presents for at risk foot care today.  General Appearance  Alert, conversant and in no acute stress.  Vascular  Dorsalis pedis and posterior tibial  pulses are not  palpable  bilaterally.  Capillary return is within normal limits  bilaterally. Temperature is within normal limits  bilaterally.  Neurologic  Senn-Weinstein monofilament wire test diminished   bilaterally. Muscle power within normal limits bilaterally.  Nails Thick disfigured discolored nails with subungual debris  from hallux to fifth toes bilaterally. No evidence of bacterial infection or drainage bilaterally.  Orthopedic  No limitations of motion  feet .  No crepitus or effusions noted.  No bony pathology or digital deformities noted. 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  Skin  normotropic skin  noted bilaterally.  No signs of infections or ulcers noted.   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  Callus left hallux.  Callus midfoot right    Porokeratosis B/L.  Onychomycosis  B/L.  Consent was obtained for treatment procedures.   Mechanical debridement of nails 1-5  bilaterally performed with a nail nipper.  Filed with dremel without incident.  Debride callus with # 15 blade.  No infection or ulcer.     Return office visit   3 months                  Told patient to return for periodic foot care and evaluation due to potential at risk complications.   Gardiner Barefoot DPM

## 2020-03-27 DIAGNOSIS — R339 Retention of urine, unspecified: Secondary | ICD-10-CM | POA: Diagnosis not present

## 2020-03-27 DIAGNOSIS — E1169 Type 2 diabetes mellitus with other specified complication: Secondary | ICD-10-CM | POA: Diagnosis not present

## 2020-03-27 DIAGNOSIS — R6889 Other general symptoms and signs: Secondary | ICD-10-CM | POA: Diagnosis not present

## 2020-03-27 DIAGNOSIS — R490 Dysphonia: Secondary | ICD-10-CM | POA: Diagnosis not present

## 2020-04-13 ENCOUNTER — Other Ambulatory Visit: Payer: Self-pay | Admitting: Otolaryngology

## 2020-04-13 DIAGNOSIS — R490 Dysphonia: Secondary | ICD-10-CM | POA: Diagnosis not present

## 2020-04-13 DIAGNOSIS — J383 Other diseases of vocal cords: Secondary | ICD-10-CM | POA: Diagnosis not present

## 2020-04-13 DIAGNOSIS — F79 Unspecified intellectual disabilities: Secondary | ICD-10-CM | POA: Diagnosis not present

## 2020-04-17 ENCOUNTER — Encounter (HOSPITAL_COMMUNITY): Payer: Self-pay | Admitting: Otolaryngology

## 2020-04-17 NOTE — Progress Notes (Signed)
Spoke with pt's sister, Billie Lade for pre-op call. DPR on file and she is also patient's healthcare power of attorney - paper work is under the media tab dated 04/21/16. Pt is mentally challenged, but understands most things. Pt lives in a group home, but [name] will be bringing him here and will need to be with him in pre-op due to pt's anxiety. No known cardiac history. Pt is pre-diabetic. Last A1C was 5.3 two weeks ago per [name]. He is on no diabetic medications.  Aspirin last dose was 5 days ago.  Covid test will need to be done day of surgery since he does live in a group home.

## 2020-04-20 ENCOUNTER — Ambulatory Visit (HOSPITAL_COMMUNITY): Payer: Medicare HMO | Admitting: Certified Registered Nurse Anesthetist

## 2020-04-20 ENCOUNTER — Encounter (HOSPITAL_COMMUNITY)
Admission: RE | Disposition: A | Discharge status: Home / Self Care | Payer: Self-pay | Source: Home / Self Care | Attending: Otolaryngology

## 2020-04-20 ENCOUNTER — Encounter (HOSPITAL_COMMUNITY): Payer: Self-pay | Admitting: Otolaryngology

## 2020-04-20 ENCOUNTER — Ambulatory Visit (HOSPITAL_COMMUNITY)
Admission: RE | Admit: 2020-04-20 | Discharge: 2020-04-20 | Disposition: A | Discharge status: Home / Self Care | Payer: Medicare HMO | Attending: Otolaryngology | Admitting: Otolaryngology

## 2020-04-20 ENCOUNTER — Other Ambulatory Visit (HOSPITAL_COMMUNITY): Payer: Self-pay | Admitting: Otolaryngology

## 2020-04-20 ENCOUNTER — Other Ambulatory Visit: Payer: Self-pay | Admitting: Otolaryngology

## 2020-04-20 ENCOUNTER — Other Ambulatory Visit: Payer: Self-pay

## 2020-04-20 DIAGNOSIS — R490 Dysphonia: Secondary | ICD-10-CM | POA: Insufficient documentation

## 2020-04-20 DIAGNOSIS — Z20822 Contact with and (suspected) exposure to covid-19: Secondary | ICD-10-CM | POA: Insufficient documentation

## 2020-04-20 DIAGNOSIS — K219 Gastro-esophageal reflux disease without esophagitis: Secondary | ICD-10-CM | POA: Diagnosis not present

## 2020-04-20 DIAGNOSIS — E119 Type 2 diabetes mellitus without complications: Secondary | ICD-10-CM | POA: Diagnosis not present

## 2020-04-20 DIAGNOSIS — F79 Unspecified intellectual disabilities: Secondary | ICD-10-CM | POA: Diagnosis not present

## 2020-04-20 DIAGNOSIS — Z87891 Personal history of nicotine dependence: Secondary | ICD-10-CM | POA: Insufficient documentation

## 2020-04-20 DIAGNOSIS — I10 Essential (primary) hypertension: Secondary | ICD-10-CM | POA: Diagnosis not present

## 2020-04-20 DIAGNOSIS — J383 Other diseases of vocal cords: Secondary | ICD-10-CM | POA: Diagnosis not present

## 2020-04-20 DIAGNOSIS — Z982 Presence of cerebrospinal fluid drainage device: Secondary | ICD-10-CM | POA: Diagnosis not present

## 2020-04-20 DIAGNOSIS — Z79899 Other long term (current) drug therapy: Secondary | ICD-10-CM | POA: Insufficient documentation

## 2020-04-20 DIAGNOSIS — C329 Malignant neoplasm of larynx, unspecified: Secondary | ICD-10-CM | POA: Insufficient documentation

## 2020-04-20 DIAGNOSIS — C320 Malignant neoplasm of glottis: Secondary | ICD-10-CM | POA: Diagnosis not present

## 2020-04-20 DIAGNOSIS — M069 Rheumatoid arthritis, unspecified: Secondary | ICD-10-CM | POA: Diagnosis not present

## 2020-04-20 HISTORY — DX: Anxiety disorder, unspecified: F41.9

## 2020-04-20 HISTORY — PX: MICROLARYNGOSCOPY: SHX5208

## 2020-04-20 LAB — BASIC METABOLIC PANEL
Anion gap: 7 (ref 5–15)
BUN: 16 mg/dL (ref 8–23)
CO2: 29 mmol/L (ref 22–32)
Calcium: 8.7 mg/dL — ABNORMAL LOW (ref 8.9–10.3)
Chloride: 102 mmol/L (ref 98–111)
Creatinine, Ser: 0.74 mg/dL (ref 0.61–1.24)
GFR, Estimated: >60 mL/min (ref >60)
Glucose, Bld: 110 mg/dL — ABNORMAL HIGH (ref 70–99)
Potassium: 3.7 mmol/L (ref 3.5–5.1)
Sodium: 138 mmol/L (ref 135–145)

## 2020-04-20 LAB — CBC
HCT: 41.1 % (ref 39.0–52.0)
Hemoglobin: 13.6 g/dL (ref 13.0–17.0)
MCH: 30 pg (ref 26.0–34.0)
MCHC: 33.1 g/dL (ref 30.0–36.0)
MCV: 90.7 fL (ref 80.0–100.0)
Platelets: 317 10*3/uL (ref 150–400)
RBC: 4.53 MIL/uL (ref 4.22–5.81)
RDW: 13 % (ref 11.5–15.5)
WBC: 9 10*3/uL (ref 4.0–10.5)
nRBC: 0 % (ref 0.0–0.2)

## 2020-04-20 LAB — SARS CORONAVIRUS 2 BY RT PCR (HOSPITAL ORDER, PERFORMED IN ~~LOC~~ HOSPITAL LAB): SARS Coronavirus 2: NEGATIVE

## 2020-04-20 LAB — GLUCOSE, CAPILLARY: Glucose-Capillary: 104 mg/dL — ABNORMAL HIGH (ref 70–99)

## 2020-04-20 SURGERY — MICROLARYNGOSCOPY
Anesthesia: General | Site: Throat

## 2020-04-20 MED ORDER — ROCURONIUM BROMIDE 10 MG/ML (PF) SYRINGE
PREFILLED_SYRINGE | INTRAVENOUS | Status: DC | PRN
  Administered 2020-04-20: 50 mg via INTRAVENOUS

## 2020-04-20 MED ORDER — LIDOCAINE 2% (20 MG/ML) 5 ML SYRINGE
INTRAMUSCULAR | Status: DC | PRN
  Administered 2020-04-20: 100 mg via INTRAVENOUS

## 2020-04-20 MED ORDER — EPINEPHRINE PF 1 MG/ML IJ SOLN
INTRAMUSCULAR | Status: DC | PRN
  Administered 2020-04-20: 30 mL

## 2020-04-20 MED ORDER — ORAL CARE MOUTH RINSE: 15.0000 mL | Freq: Once | OROMUCOSAL | Status: AC

## 2020-04-20 MED ORDER — PROPOFOL 10 MG/ML IV BOLUS
INTRAVENOUS | Status: AC
  Filled 2020-04-20: qty 20

## 2020-04-20 MED ORDER — LACTATED RINGERS IV SOLN: INTRAVENOUS | Status: DC | PRN

## 2020-04-20 MED ORDER — MEPERIDINE HCL 25 MG/ML IJ SOLN: 6.2500 mg | INTRAMUSCULAR | Status: DC | PRN

## 2020-04-20 MED ORDER — SUGAMMADEX SODIUM 200 MG/2ML IV SOLN
INTRAVENOUS | Status: DC | PRN
  Administered 2020-04-20: 200 mg via INTRAVENOUS

## 2020-04-20 MED ORDER — DEXAMETHASONE SODIUM PHOSPHATE 10 MG/ML IJ SOLN
INTRAMUSCULAR | Status: AC
  Filled 2020-04-20: qty 1

## 2020-04-20 MED ORDER — AMISULPRIDE (ANTIEMETIC) 5 MG/2ML IV SOLN: 10.0000 mg | Freq: Once | INTRAVENOUS | Status: DC | PRN

## 2020-04-20 MED ORDER — HYDROCODONE-ACETAMINOPHEN 5-325 MG PO TABS: 1.0000 | ORAL_TABLET | Freq: Three times a day (TID) | ORAL | 0 refills | Status: DC | PRN

## 2020-04-20 MED ORDER — ACETAMINOPHEN 10 MG/ML IV SOLN: 1000.0000 mg | Freq: Once | INTRAVENOUS | Status: DC | PRN

## 2020-04-20 MED ORDER — FENTANYL CITRATE (PF) 100 MCG/2ML IJ SOLN: 25.0000 ug | INTRAMUSCULAR | Status: DC | PRN

## 2020-04-20 MED ORDER — MIDAZOLAM HCL 2 MG/2ML IJ SOLN
INTRAMUSCULAR | Status: AC
  Filled 2020-04-20: qty 2

## 2020-04-20 MED ORDER — ACETAMINOPHEN 325 MG PO TABS
325.0000 mg | ORAL_TABLET | Freq: Once | ORAL | Status: DC | PRN
Start: 2020-04-20 — End: 2020-04-20

## 2020-04-20 MED ORDER — ONDANSETRON HCL 4 MG/2ML IJ SOLN
INTRAMUSCULAR | Status: DC | PRN
  Administered 2020-04-20: 4 mg via INTRAVENOUS

## 2020-04-20 MED ORDER — ACETAMINOPHEN 160 MG/5ML PO SOLN: 325.0000 mg | Freq: Once | ORAL | Status: DC | PRN

## 2020-04-20 MED ORDER — LACTATED RINGERS IV SOLN: INTRAVENOUS | Status: DC

## 2020-04-20 MED ORDER — CHLORHEXIDINE GLUCONATE 0.12 % MT SOLN
15.0000 mL | Freq: Once | OROMUCOSAL | Status: AC
  Administered 2020-04-20: 15 mL via OROMUCOSAL
  Filled 2020-04-20: qty 15

## 2020-04-20 MED ORDER — DOCUSATE SODIUM 100 MG PO CAPS: 100.0000 mg | ORAL_CAPSULE | Freq: Two times a day (BID) | ORAL | 0 refills | Status: AC | PRN

## 2020-04-20 MED ORDER — EPINEPHRINE HCL (NASAL) 0.1 % NA SOLN
NASAL | Status: AC
  Filled 2020-04-20: qty 30

## 2020-04-20 MED ORDER — HYDROCODONE-ACETAMINOPHEN 5-325 MG PO TABS
1.0000 | ORAL_TABLET | Freq: Three times a day (TID) | ORAL | 0 refills | Status: AC | PRN
Start: 2020-04-20 — End: 2020-04-23

## 2020-04-20 MED ORDER — ONDANSETRON HCL 4 MG/2ML IJ SOLN
INTRAMUSCULAR | Status: AC
  Filled 2020-04-20: qty 2

## 2020-04-20 MED ORDER — DOCUSATE SODIUM 100 MG PO CAPS: 100.0000 mg | ORAL_CAPSULE | Freq: Two times a day (BID) | ORAL | 0 refills | Status: DC | PRN

## 2020-04-20 MED ORDER — FENTANYL CITRATE (PF) 250 MCG/5ML IJ SOLN
INTRAMUSCULAR | Status: DC | PRN
  Administered 2020-04-20 (×2): 50 ug via INTRAVENOUS

## 2020-04-20 MED ORDER — DEXAMETHASONE SODIUM PHOSPHATE 10 MG/ML IJ SOLN
INTRAMUSCULAR | Status: DC | PRN
  Administered 2020-04-20: 10 mg via INTRAVENOUS

## 2020-04-20 MED ORDER — FENTANYL CITRATE (PF) 250 MCG/5ML IJ SOLN
INTRAMUSCULAR | Status: AC
  Filled 2020-04-20: qty 5

## 2020-04-20 MED ORDER — EPHEDRINE SULFATE-NACL 50-0.9 MG/10ML-% IV SOSY
PREFILLED_SYRINGE | INTRAVENOUS | Status: DC | PRN
  Administered 2020-04-20 (×2): 10 mg via INTRAVENOUS

## 2020-04-20 MED ORDER — EPHEDRINE 5 MG/ML INJ
INTRAVENOUS | Status: AC
  Filled 2020-04-20: qty 10

## 2020-04-20 MED ORDER — PROPOFOL 10 MG/ML IV BOLUS
INTRAVENOUS | Status: DC | PRN
  Administered 2020-04-20: 150 mg via INTRAVENOUS

## 2020-04-20 MED ORDER — 0.9 % SODIUM CHLORIDE (POUR BTL) OPTIME
TOPICAL | Status: DC | PRN
  Administered 2020-04-20: 1000 mL

## 2020-04-20 SURGICAL SUPPLY — 33 items
BALLN PULM 12 13.5 15X75 (BALLOONS)
BALLN PULMONARY 10-12 (MISCELLANEOUS) IMPLANT
BALLOON PULM 12 13.5 15X75 (BALLOONS) IMPLANT
BLADE SURG 15 STRL LF DISP TIS (BLADE) IMPLANT
BLADE SURG 15 STRL SS (BLADE)
BNDG EYE OVAL (GAUZE/BANDAGES/DRESSINGS) ×4 IMPLANT
CANISTER SUCT 3000ML PPV (MISCELLANEOUS) ×2 IMPLANT
CNTNR URN SCR LID CUP LEK RST (MISCELLANEOUS) ×3 IMPLANT
CONT SPEC 4OZ STRL OR WHT (MISCELLANEOUS) ×6
COVER BACK TABLE 60X90IN (DRAPES) ×2 IMPLANT
COVER MAYO STAND STRL (DRAPES) ×2 IMPLANT
DRAPE HALF SHEET 40X57 (DRAPES) ×2 IMPLANT
DRSG TELFA 3X8 NADH (GAUZE/BANDAGES/DRESSINGS) ×2 IMPLANT
GAUZE SPONGE 4X4 12PLY STRL (GAUZE/BANDAGES/DRESSINGS) IMPLANT
GAUZE SPONGE 4X4 16PLY XRAY LF (GAUZE/BANDAGES/DRESSINGS) ×2 IMPLANT
GLOVE SURG ENC MOIS LTX SZ6.5 (GLOVE) ×4 IMPLANT
GOWN STRL REUS W/ TWL LRG LVL3 (GOWN DISPOSABLE) IMPLANT
GOWN STRL REUS W/TWL LRG LVL3 (GOWN DISPOSABLE)
KIT BASIN OR (CUSTOM PROCEDURE TRAY) ×2 IMPLANT
KIT TURNOVER KIT B (KITS) ×2 IMPLANT
NEEDLE HYPO 25GX1X1/2 BEV (NEEDLE) ×2 IMPLANT
NS IRRIG 1000ML POUR BTL (IV SOLUTION) ×2 IMPLANT
PAD ARMBOARD 7.5X6 YLW CONV (MISCELLANEOUS) ×4 IMPLANT
PATTIES SURGICAL .5 X1 (DISPOSABLE) ×2 IMPLANT
PATTIES SURGICAL .5 X3 (DISPOSABLE) IMPLANT
POSITIONER HEAD DONUT 9IN (MISCELLANEOUS) IMPLANT
SOL ANTI FOG 6CC (MISCELLANEOUS) ×1 IMPLANT
SOLUTION ANTI FOG 6CC (MISCELLANEOUS) ×1
SURGILUBE 2OZ TUBE FLIPTOP (MISCELLANEOUS) IMPLANT
SUT SILK 2 0 PERMA HAND 18 BK (SUTURE) IMPLANT
TOWEL GREEN STERILE FF (TOWEL DISPOSABLE) ×2 IMPLANT
TUBE CONNECTING 12X1/4 (SUCTIONS) ×2 IMPLANT
WATER STERILE IRR 1000ML POUR (IV SOLUTION) IMPLANT

## 2020-04-20 NOTE — Discharge Instructions (Signed)
Laryngoscopy, Care After  This sheet gives you information about how to care for yourself after your procedure. Your health care provider may also give you more specific instructions. If you have problems or questions, contact your health care provider.  What can I expect after the procedure?  After the procedure, it is common to have:  · A sore throat.  · A hoarse voice.  · A temporary change in how your voice sounds.  Follow these instructions at home:  Medicines  · Take over-the-counter and prescription medicines only as told by your health care provider.  Driving       · Do not drive for 24 hours if you were given a sedative during your procedure.  General instructions       · Return to your normal activities as told by your health care provider. Ask your health care provider what activities are safe for you.  ? If your laryngoscopy was done using medicine to numb only your throat (local anesthetic), you may be able to return to your normal activities right away.  · If your health care provider took tissue from your throat for lab testing (biopsy), it is up to you to get your test results. Ask your health care provider, or the department that did the procedure, when your results will be ready.  · Do not use any products that contain nicotine or tobacco, such as cigarettes and e-cigarettes. If you need help quitting, ask your health care provider.  · Follow instructions from your health care provider about eating or drinking restrictions.  · Keep all follow-up visits as told by your health care provider. This is important.  Contact a health care provider if:  · You have a fever.  · You develop a cough.  · You develop nausea and vomiting.  Get help right away if:  · You have severe pain.  · You have chest pain.  · You have new bleeding during coughing, spitting, or vomiting.  · You develop new problems with swallowing.  · You have difficulty breathing.  Summary  · After a laryngoscopy it is common to have a sore  throat, a hoarse voice, or a temporary change in the sound of your voice.  · Take over the counter and prescription medicines as told by your health care provider.  · Get help right away if you have difficulty breathing after this procedure.  · Keep all follow-up visits as told by your health care provider. This is important.  This information is not intended to replace advice given to you by your health care provider. Make sure you discuss any questions you have with your health care provider.  Document Revised: 05/24/2017 Document Reviewed: 05/24/2017  Elsevier Patient Education © 2021 [employer]   

## 2020-04-20 NOTE — Anesthesia Preprocedure Evaluation (Addendum)
Anesthesia Evaluation  Patient identified by MRN, date of birth, ID band Patient awake    Reviewed: Allergy & Precautions, NPO status , Patient's Chart, lab work & pertinent test results  Airway Mallampati: I  TM Distance: >3 FB Neck ROM: Full    Dental  (+) Partial Lower, Dental Advisory Given   Pulmonary neg pulmonary ROS,    breath sounds clear to auscultation       Cardiovascular hypertension, Pt. on medications  Rhythm:Regular Rate:Normal     Neuro/Psych PSYCHIATRIC DISORDERS Anxiety Schizophrenia negative neurological ROS     GI/Hepatic Neg liver ROS, GERD  Medicated,  Endo/Other  diabetes  Renal/GU negative Renal ROS     Musculoskeletal  (+) Arthritis ,   Abdominal Normal abdominal exam  (+)   Peds  Hematology   Anesthesia Other Findings - Hoarse voice  Reproductive/Obstetrics                            Anesthesia Physical Anesthesia Plan  ASA: III  Anesthesia Plan: General   Post-op Pain Management:    Induction: Intravenous  PONV Risk Score and Plan: 3 and Ondansetron, Dexamethasone and Midazolam  Airway Management Planned: Oral ETT  Additional Equipment: None  Intra-op Plan:   Post-operative Plan: Extubation in OR  Informed Consent: I have reviewed the patients History and Physical, chart, labs and discussed the procedure including the risks, benefits and alternatives for the proposed anesthesia with the patient or authorized representative who has indicated his/her understanding and acceptance.     Dental advisory given  Plan Discussed with: CRNA  Anesthesia Plan Comments: (7.0 ETT  Covid-19 Nucleic Acid Test Results Lab Results      Component                Value               Date                      [place]              NEGATIVE            04/20/2020                [place]              Not Detected        10/05/2018           )        Anesthesia Quick Evaluation

## 2020-04-20 NOTE — Anesthesia Procedure Notes (Signed)
Procedure Name: Intubation Date/Time: 04/20/2020 7:49 AM Performed by: Kathryne Hitch, CRNA Pre-anesthesia Checklist: Patient identified, Emergency Drugs available, Suction available and Patient being monitored Patient Re-evaluated:Patient Re-evaluated prior to induction Oxygen Delivery Method: Circle system utilized Preoxygenation: Pre-oxygenation with 100% oxygen Induction Type: IV induction Ventilation: Mask ventilation without difficulty Laryngoscope Size: Miller and 3 Grade View: Grade I Tube type: Oral Tube size: 6.5 mm Number of attempts: 1 Airway Equipment and Method: Stylet and Oral airway Placement Confirmation: ETT inserted through vocal cords under direct vision,  positive ETCO2 and breath sounds checked- equal and bilateral Secured at: 22 cm Tube secured with: Tape Dental Injury: Teeth and Oropharynx as per pre-operative assessment

## 2020-04-20 NOTE — H&P (Signed)
Arthur Weaver is an 60 y.o. male.    Chief Complaint:  Hoarseness  HPI: Patient presents with POA/ sister for planned procedure. No change in history since office visit 04/13/2020:  Geza Beranek is a 60 y.o. male who presents as a new patient, referred by Velna Hatchet, MD for evaluation and treatment of hoarseness for the last 3 years. Patient presents with his sister, [name], who provides the majority of his history as patient has history of intellectual disability and resides in a group home. He is able to participate in his history, and answers questions appropriately. She states that patient symptoms first started approximately 3 years ago, and at that time he had symptoms of a URI and severe sinusitis. She states that the sinus symptoms resolved, but since that time, she notes that his voice has not returned to baseline. She states that his symptoms were thought to be secondary to allergies, and he was placed on oral antihistamines and intranasal steroid sprays without significant improvement. Patient does endorse history of acid reflux, and is on a daily PPI. He denies shortness of breath, difficulty breathing. He denies dysphagia, odynophagia, unintentional weight loss. Patient is a non-smoker, but did chew tobacco in the past. He chewed for about 5 years, but quit several years ago. He does not drink alcohol. He has not had any recent imaging.     Past Medical History:  Diagnosis Date  . Anxiety   . At risk for sleep apnea    STOP-BANG= 5       SENT TO PCP 04-14-2016  . BPH (benign prostatic hyperplasia)   . DDD (degenerative disc disease), cervical   . DDD (degenerative disc disease), lumbar   . Diverticulosis of colon   . Family history of factor V deficiency    per pt's sister (whom is pt's guardian) she, pt's brother and parents have factor V but pt has never been tested  . Frequency of urination   . GERD (gastroesophageal reflux disease)   . History of adenomatous polyp of  colon    2006 and 03-18-2010 tubular adenoma  . Hyperlipidemia   . Hypertension   . Lives in independent group home resides at [facility] in [place], [place]   pt independant w/ ADLs w/ exception does not cook for shop   . Mentally challenged    SPECIAL NEEDS  . OA (osteoarthritis)    hands, knees, feet  . Pre-diabetes   . Psoriasis    per patient's sister  . Thoracic spondylosis   . Urgency of urination   . Wears partial dentures    lower    Past Surgical History:  Procedure Laterality Date  . CATARACT EXTRACTION W/ INTRAOCULAR LENS  IMPLANT, BILATERAL  2006  . CIRCUMCISION  03/29/2007   W/  CYSTOSCOPY AND TRANSRECTAL ULTRASOUND GUIDED PROSTATE BX'S  . COLONOSCOPY  last one 08-26-2015  . THULIUM LASER TURP (TRANSURETHRAL RESECTION OF PROSTATE) N/A 04/21/2016   Procedure: THULIUM LASER TURP (TRANSURETHRAL RESECTION OF PROSTATE);  Surgeon: Carolan Clines, MD;  Location: [facility];  Service: Urology;  Laterality: N/A;    Family History  Problem Relation Age of Onset  . Breast cancer Mother   . Brain cancer Mother   . Hypertension Mother   . Diabetes Mother   . Crohn's disease Father   . Rheum arthritis Father   . GER disease Father   . Ulcers Father   . Cataracts Sister   . Fibromyalgia Sister   .  COPD Sister   . Breast cancer Sister   . GER disease Sister   . Hypothyroidism Sister   . Osteoarthritis Sister   . GER disease Brother   . GER disease Brother   . Stroke Brother   . Liver cancer Brother   . Heart attack Brother   . Colon cancer Neg Hx     Social History:  reports that he has never smoked. He quit smokeless tobacco use about 28 years ago.  His smokeless tobacco use included chew. He reports that he does not drink alcohol and does not use drugs.  Allergies:  Allergies  Allergen Reactions  . Penicillins Shortness Of Breath    "couldn't breathe" Has patient had a PCN reaction causing immediate rash,  facial/tongue/throat swelling, SOB or lightheadedness with hypotension: No Has patient had a PCN reaction causing severe rash involving mucus membranes or skin necrosis: No Has patient had a PCN reaction that required hospitalization: No Has patient had a PCN reaction occurring within the last 10 years: No If all of the above answers are "NO", then may proceed with Cephalosporin use. "couldn't breathe" Has patient had a PCN reaction causing immediate rash, facial/tongue/throat swelling, SOB or lightheadedness with hypotension: No Has patient had a PCN reaction causing severe rash involving mucus membranes or skin necrosis: No Has patient had a PCN reaction that required hospitalization: No Has patient had a PCN reaction occurring within the last 10 years: No If all of the above answers are "NO", then may proceed with Cephalosporin use.   . Bacitracin Other (See Comments)    Positive patch test  . Imidurea Other (See Comments)    Positive patch test  . Methylisothiazolinone Other (See Comments)    Positive patch test Positive patch test   . Neomycin Other (See Comments)    Positive patch test  . Polymyxin B Other (See Comments)    Positive patch test  . Pork-Derived Products     AVOIDS DUE TO RELIGIOUS BELIEFS  . Sulfa Antibiotics Nausea And Vomiting  . Urea Other (See Comments)    Positive patch test    Medications Prior to Admission  Medication Sig Dispense Refill  . amLODipine (NORVASC) 5 MG tablet Take 5 mg by mouth daily.     Marland Kitchen aspirin 81 MG tablet Take 81 mg by mouth daily.    . Calcium Carb-Cholecalciferol (CALCIUM+D3) 600-800 MG-UNIT TABS Take 1 tablet by mouth daily.    . Cholecalciferol (VITAMIN D3) 1000 units CAPS Take 1,000 Units by mouth daily at 2 am.    . diclofenac Sodium (VOLTAREN) 1 % GEL Apply 2 g to 4 g to the affected area up to 4 times daily as needed. (Patient taking differently: Apply 3 g topically 3 (three) times daily.) 400 g 4  . FLUoxetine (PROZAC) 20  MG capsule TAKE 1 CAPSULE BY MOUTH EVERY DAY IN THE MORNING WITH FOOD (Patient taking differently: Take 20 mg by mouth daily.) 30 capsule 5  . lisinopril (PRINIVIL,ZESTRIL) 40 MG tablet Take 40 mg by mouth daily.     . Multiple Vitamin (MULTIVITAMIN PO) Take 1 tablet by mouth every evening.    Marland Kitchen omeprazole (PRILOSEC) 20 MG capsule Take 20 mg by mouth at bedtime.    . rosuvastatin (CRESTOR) 5 MG tablet Take 5 mg by mouth daily.    . tamsulosin (FLOMAX) 0.4 MG CAPS capsule 0.4 mg daily after breakfast.    . acetaminophen (TYLENOL) 325 MG tablet Take 650 mg by mouth every 4 (  four) hours as needed for moderate pain or headache.    . Ibuprofen 200 MG CAPS Take 1 capsule (200 mg total) by mouth 3 (three) times daily. (Patient taking differently: Take 200 mg by mouth 4 (four) times daily as needed (inflammation/ pain).) 90 each 3  . nabumetone (RELAFEN) 500 MG tablet Take 500 mg by mouth daily as needed (Arthritis pain).    . triamcinolone cream (KENALOG) 0.1 % Apply 1 application topically 2 (two) times daily as needed. (Patient taking differently: Apply 1 application topically 3 (three) times daily.) 453.6 g 0    Results for orders placed or performed during the hospital encounter of 04/20/20 (from the past 48 hour(s))  SARS Coronavirus 2 by RT PCR (hospital order, performed in [facility] hospital lab) Nasopharyngeal Nasopharyngeal Swab     Status: None   Collection Time: 04/20/20  6:01 AM   Specimen: Nasopharyngeal Swab  Result Value Ref Range   SARS Coronavirus 2 NEGATIVE NEGATIVE    Comment: (NOTE) SARS-CoV-2 target nucleic acids are NOT DETECTED.  The SARS-CoV-2 RNA is generally detectable in upper and lower respiratory specimens during the acute phase of infection. The lowest concentration of SARS-CoV-2 viral copies this assay can detect is 250 copies / mL. A negative result does not preclude SARS-CoV-2 infection and should not be used as the sole basis for treatment or other patient  management decisions.  A negative result may occur with improper specimen collection / handling, submission of specimen other than nasopharyngeal swab, presence of viral mutation(s) within the areas targeted by this assay, and inadequate number of viral copies (<250 copies / mL). A negative result must be combined with clinical observations, patient history, and epidemiological information.  Fact Sheet for Patients:   StrictlyIdeas.no  Fact Sheet for Healthcare Providers: BankingDealers.co.za  This test is not yet approved or  cleared by the [place] FDA and has been authorized for detection and/or diagnosis of SARS-CoV-2 by FDA under an Emergency Use Authorization (EUA).  This EUA will remain in effect (meaning this test can be used) for the duration of the COVID-19 declaration under Section 564(b)(1) of the Act, 21 U.S.C. section 360bbb-3(b)(1), unless the authorization is terminated or revoked sooner.  Performed at [facility], [place] [address] [place], [place] [address]   CBC per protocol     Status: None   Collection Time: 04/20/20  6:05 AM  Result Value Ref Range   WBC 9.0 4.0 - 10.5 K/uL   RBC 4.53 4.22 - 5.81 MIL/uL   Hemoglobin 13.6 13.0 - 17.0 g/dL   HCT 41.1 39.0 - 52.0 %   MCV 90.7 80.0 - 100.0 fL   MCH 30.0 26.0 - 34.0 pg   MCHC 33.1 30.0 - 36.0 g/dL   RDW 13.0 11.5 - 15.5 %   Platelets 317 150 - 400 K/uL   nRBC 0.0 0.0 - 0.2 %    Comment: Performed at [facility], [place] [address] [place], [place] [address]  Glucose, capillary     Status: Abnormal   Collection Time: 04/20/20  6:16 AM  Result Value Ref Range   Glucose-Capillary 104 (H) 70 - 99 mg/dL    Comment: Glucose reference range applies only to samples taken after fasting for at least 8 hours.   No results found.  ROS: ROS  Blood pressure 127/85, pulse 73, temperature 97.9 F (36.6 C), temperature source Oral, resp. rate 18, height 5\' 9"   (1.753 m), weight 88 kg, SpO2 99 %.  PHYSICAL EXAM: General: Well developed, well nourished. No acute distress. Voice hoarse, weak.  Head/Face: Normocephalic, atraumatic. No scars or lesions. No sinus tenderness. Facial nerve intact and equal bilaterally.  Eyes: Pupils are equal, round and reactive to light. Conjunctiva and lids are normal. Normal extraocular mobility.  Ears: Right: Pinna and external meatus normal Left: Pinna and external meatus normal Nose: No gross deformity or lesions. No purulent discharge. Septum deviated to the right 2+, 2+ turbinate hypertrophy.  Mouth/Oropharynx: Lips normal, teeth and gums normal with good upper dentition, absent mandibular dentition with lower denture in place, normal oral vestibule. Normal floor of mouth, tongue and oral mucosa, no mucosal lesions, ulcer or mass, normal tongue mobility. Uvula midline. Hard and soft palate normal with normal mobility. Symmetric 2+ tonsils, no erythema or exudate.  Neck: Trachea midline.  Lymphatic: No lymphadenopathy in the neck.  Respiratory: No stridor or distress.  Extremities: No edema or cyanosis. Warm and well-perfused.  Neurologic: CN II-XII intact. Alert and oriented to self, place and time. Normal reflexes and motor skills, balance and coordination. Moving all extremities without gross abnormality.  Psychiatric: No unusual anxiety or evidence of depression. Appropriate affect.    Studies Reviewed: FFL   Assessment/Plan Eashan Schipani is a 60 y.o. 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  - To OR today for microlaryngoscopy with biopsy to determine the nature of this lesion. Further recommendations pending biopsy results. Risks, benefits, and alternatives of the planned procedure were discussed and the patient's POA expressed understanding and agreement.    Dannell Raczkowski A  Shayn Madole 04/20/2020, 7:24 AM

## 2020-04-20 NOTE — Transfer of Care (Signed)
Immediate Anesthesia Transfer of Care Note  Patient: Arthur Weaver  Procedure(s) Performed: MICROLARYNGOSCOPY WITH BIOPSY (Throat)  Patient Location: PACU  Anesthesia Type:General  Level of Consciousness: drowsy and patient cooperative  Airway & Oxygen Therapy: Patient Spontanous Breathing  Post-op Assessment: Report given to RN and Post -op Vital signs reviewed and stable  Post vital signs: Reviewed and stable  Last Vitals:  Vitals Value Taken Time  BP 120/59 04/20/20 0940  Temp 36.3 C 04/20/20 0940  Pulse 64 04/20/20 0941  Resp 11 04/20/20 0941  SpO2 97 % 04/20/20 0941  Vitals shown include unvalidated device data.  Last Pain:  Vitals:   04/20/20 0940  TempSrc:   PainSc: 0-No pain         Complications: No complications documented.

## 2020-04-20 NOTE — Op Note (Signed)
OPERATIVE NOTE  DERIK FULTS Date/Time of Admission: 04/20/2020  5:44 AM  CSN: [id number];YBO:[id number] Attending Provider: Ebbie Latus A, DO Room/Bed: MCPO/NONE DOB: 11-25-1960 Age: 60 y.o.   Pre-Op Diagnosis: Hoarseness for the last 3 years  Post-Op Diagnosis: Hoarseness for the last 3 years  Procedure: Procedure(s): MICROLARYNGOSCOPY WITH BIOPSY  Anesthesia: General  Surgeon(s): Jason Coop, DO  Staff: Circulator: Lamount Cohen, RN Scrub Person: Dabbs, Renato Gails, CST Circulator Assistant: Bartholomew Boards, RN  Implants: * No implants in log *  Specimens: ID Type Source Tests Collected by Time Destination  1 : LEFT TRUE VOCAL ENT Throat SURGICAL PATHOLOGY Skotnicki, Pole Ojea, DO 04/20/2020 0800   2 : LEFT TRUE VOCAL ENT Throat SURGICAL PATHOLOGY Skotnicki, Auberry, DO 04/20/2020 0804   3 : LEFT LARYNGEAL INLET ENT Throat SURGICAL PATHOLOGY Skotnicki, Meghan A, DO 2/77/8242 3536     Complications: None  EBL: <5 ML  Condition: stable  Operative Findings:  Irregularity with exophytic lesion of left true vocal fold, with no other focal abnormality noted. No involvement of the left false cord noted.   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 General endotracheal anesthesia was established without difficulty. When the patient was adequately anesthetized, surgical timeout was performed and correct identification of the patient and the surgical procedure. The patient was positioned and prepped and draped in sterile fashion.  A laryngoscope was used to examine the patient's oral cavity, oropharynx and larynx. All subsites of the larynx were examined, and noted to be normal with the exception of findings as documented above.  With the glottis in view, the patient was  placed in suspension, and a 0 degree endoscope was used to visualize the glottis and take a photo. Using the operating microscope, biopsies of the left true vocal fold were taken and submitted for frozen section. Additional tissue was taken for permanent specimen. Additional biopsies of the laryngeal inlet were taken. Adrenaline-soaked pledgets were placed for hemostasis.  There was no significant bleeding and the patient's airway was stable.   An orogastric tube was passed and stomach contents were aspirated. Patient was awakened from anesthetic and transferred from the operating room to the recovery room in stable condition. There were no complications and blood loss was minimal.  Jason Coop, [facility]  04/20/2020

## 2020-04-21 ENCOUNTER — Encounter (HOSPITAL_COMMUNITY): Payer: Self-pay | Admitting: Otolaryngology

## 2020-04-21 NOTE — Anesthesia Postprocedure Evaluation (Signed)
Anesthesia Post Note  Patient: Arthur Weaver  Procedure(s) Performed: MICROLARYNGOSCOPY WITH BIOPSY (Throat)     Patient location during evaluation: PACU Anesthesia Type: General Level of consciousness: awake and alert Pain management: pain level controlled Vital Signs Assessment: post-procedure vital signs reviewed and stable Respiratory status: spontaneous breathing, nonlabored ventilation, respiratory function stable and patient connected to nasal cannula oxygen Cardiovascular status: blood pressure returned to baseline and stable Postop Assessment: no apparent nausea or vomiting Anesthetic complications: no   No complications documented.  Last Vitals:  Vitals:   04/20/20 0925 04/20/20 0940  BP: 102/86 (!) 120/59  Pulse: 64 64  Resp: 17 (!) 8  Temp:  (!) 36.3 C  SpO2: 97% 98%    Last Pain:  Vitals:   04/20/20 0940  TempSrc:   PainSc: 0-No pain                 Effie Berkshire

## 2020-04-22 ENCOUNTER — Encounter: Payer: Self-pay | Admitting: Radiation Oncology

## 2020-04-22 DIAGNOSIS — C320 Malignant neoplasm of glottis: Secondary | ICD-10-CM | POA: Insufficient documentation

## 2020-04-28 ENCOUNTER — Other Ambulatory Visit (HOSPITAL_COMMUNITY): Payer: Self-pay | Admitting: Otolaryngology

## 2020-04-28 ENCOUNTER — Telehealth: Payer: Self-pay | Admitting: Radiation Oncology

## 2020-04-28 ENCOUNTER — Other Ambulatory Visit: Payer: Self-pay | Admitting: Otolaryngology

## 2020-04-28 DIAGNOSIS — C320 Malignant neoplasm of glottis: Secondary | ICD-10-CM

## 2020-04-28 NOTE — Telephone Encounter (Signed)
Rec'd about a week ago a referral from Dr. Dorathy Kinsman office to see our Radiation Onc., Dr. Isidore Moos. Was told that their office was to schedule a PET scan first before we schedule them. I've called Dr. Dorathy Kinsman office and talked to Almyra Free who stated that the PET was not approved by insurance due to needing a CT of the neck done first. She said she will see what is going on with getting the CT scan scheduled. I will check later on this.

## 2020-04-29 ENCOUNTER — Other Ambulatory Visit: Payer: Self-pay | Admitting: Otolaryngology

## 2020-04-29 DIAGNOSIS — C320 Malignant neoplasm of glottis: Secondary | ICD-10-CM

## 2020-04-30 ENCOUNTER — Telehealth: Payer: Self-pay | Admitting: Radiation Oncology

## 2020-04-30 ENCOUNTER — Other Ambulatory Visit (HOSPITAL_BASED_OUTPATIENT_CLINIC_OR_DEPARTMENT_OTHER): Payer: Self-pay | Admitting: Otolaryngology

## 2020-04-30 ENCOUNTER — Ambulatory Visit (HOSPITAL_BASED_OUTPATIENT_CLINIC_OR_DEPARTMENT_OTHER): Admission: RE | Admit: 2020-04-30 | Payer: Medicare HMO | Source: Ambulatory Visit

## 2020-04-30 DIAGNOSIS — C320 Malignant neoplasm of glottis: Secondary | ICD-10-CM

## 2020-04-30 NOTE — Telephone Encounter (Signed)
3/24 @ 9:01a-LVM for Almyra Free @ [phone] with Dr. Dorathy Kinsman office to please get auth on scan done so that [facility] can schedule his CT of the neck. So far, it can be done stat at the [facility] location, per [place] in imaging. (lw)

## 2020-05-01 ENCOUNTER — Encounter: Payer: Self-pay | Admitting: Podiatry

## 2020-05-01 ENCOUNTER — Other Ambulatory Visit: Payer: Self-pay

## 2020-05-01 ENCOUNTER — Ambulatory Visit (INDEPENDENT_AMBULATORY_CARE_PROVIDER_SITE_OTHER): Payer: Medicare HMO | Admitting: Podiatry

## 2020-05-01 DIAGNOSIS — M2010 Hallux valgus (acquired), unspecified foot: Secondary | ICD-10-CM

## 2020-05-01 DIAGNOSIS — M129 Arthropathy, unspecified: Secondary | ICD-10-CM

## 2020-05-01 DIAGNOSIS — M79676 Pain in unspecified toe(s): Secondary | ICD-10-CM

## 2020-05-01 DIAGNOSIS — E0851 Diabetes mellitus due to underlying condition with diabetic peripheral angiopathy without gangrene: Secondary | ICD-10-CM | POA: Diagnosis not present

## 2020-05-01 DIAGNOSIS — B351 Tinea unguium: Secondary | ICD-10-CM

## 2020-05-01 DIAGNOSIS — L84 Corns and callosities: Secondary | ICD-10-CM

## 2020-05-01 NOTE — Progress Notes (Addendum)
This patient returns to my office for at risk foot care.  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  This patient is unable to cut nails himself since the patient cannot reach his nails.These nails are painful walking and wearing shoes. Patient also has painful callus since he is unable to self treat.  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  This patient presents for at risk foot care today.  General Appearance  Alert, conversant and in no acute stress.  Vascular  Dorsalis pedis and posterior tibial  pulses are not  palpable  bilaterally.  Capillary return is within normal limits  bilaterally. Temperature is within normal limits  bilaterally.  Neurologic  Senn-Weinstein monofilament wire test diminished   bilaterally. Muscle power within normal limits bilaterally.  Nails Thick disfigured discolored nails with subungual debris  from hallux to fifth toes bilaterally. No evidence of bacterial infection or drainage bilaterally.  Orthopedic  No limitations of motion  feet .  No crepitus or effusions noted.  No bony pathology or digital deformities noted. 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  Skin  normotropic skin  noted bilaterally.  No signs of infections or ulcers noted.   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  Callus left hallux.  Callus midfoot right    Porokeratosis B/L.  Marland Kitchen  Consent was obtained for treatment procedures.     Debride callus with # 15 blade.  No infection or ulcer.     Return office visit   3 months                  Told patient to return for periodic foot care and evaluation due to potential at risk complications.  Talk to pedorthist about [employer].   Gardiner Barefoot DPM

## 2020-05-05 ENCOUNTER — Other Ambulatory Visit: Payer: Self-pay | Admitting: Otolaryngology

## 2020-05-05 DIAGNOSIS — C320 Malignant neoplasm of glottis: Secondary | ICD-10-CM | POA: Diagnosis not present

## 2020-05-07 ENCOUNTER — Ambulatory Visit
Admission: RE | Admit: 2020-05-07 | Discharge: 2020-05-07 | Disposition: A | Discharge status: Home / Self Care | Payer: Medicare HMO | Source: Ambulatory Visit | Attending: Otolaryngology | Admitting: Otolaryngology

## 2020-05-07 ENCOUNTER — Other Ambulatory Visit: Payer: Self-pay

## 2020-05-07 DIAGNOSIS — J387 Other diseases of larynx: Secondary | ICD-10-CM | POA: Diagnosis not present

## 2020-05-07 DIAGNOSIS — I700 Atherosclerosis of aorta: Secondary | ICD-10-CM | POA: Diagnosis not present

## 2020-05-07 DIAGNOSIS — C320 Malignant neoplasm of glottis: Secondary | ICD-10-CM | POA: Diagnosis not present

## 2020-05-07 MED ORDER — IOPAMIDOL (ISOVUE-300) INJECTION 61%
75.0000 mL | Freq: Once | INTRAVENOUS | Status: AC | PRN
  Administered 2020-05-07: 75 mL via INTRAVENOUS

## 2020-05-08 ENCOUNTER — Other Ambulatory Visit: Payer: Self-pay

## 2020-05-08 DIAGNOSIS — C320 Malignant neoplasm of glottis: Secondary | ICD-10-CM

## 2020-05-11 ENCOUNTER — Other Ambulatory Visit: Payer: Self-pay

## 2020-05-11 ENCOUNTER — Telehealth: Payer: Self-pay | Admitting: Osteopathic Medicine

## 2020-05-11 DIAGNOSIS — C320 Malignant neoplasm of glottis: Secondary | ICD-10-CM

## 2020-05-11 NOTE — Telephone Encounter (Addendum)
Ethics Consult Note  Initial contact w/ medical team 05/08/20 secure message from Weaver  Source of Consult: Arthur Garibaldi, Weaver on behalf of Arthur Gibson, MD  Medical service: radiation oncology   Reason(s) for consult and ethical question(s): Patient lacks capacity to make informed consent, sister is asking medical team to withhold information regarding his diagnosis of cancer    Information-gathering: Discussion with source of consult Chart review 05/11/20  Review of relevant [place] policies Discussion w/ Arthur Norman PhD of Ethics Committee     Narrative:  Medical situation: Patient has intellectual disability and cancer.   Psychiatric history: Severity of intellectual disability is unspecified based on last psychiatry note reviewed from 02/13/2019. At that visit he was noted to be somewhat verbally withdrawn but he was communicative. Had been having delusions/auditory hallucinations likely due to stress around May 2020. At that time, he was having paranoid thoughts about getting COVID, being sent to nursing home, also having auditory hallucinations. He was medicated with olanzapine, later w/ fluoxetine, and olanzapine was stopped 02/2019 d/t drowsiness and weight gain, as well as noted improvement w/ fluoxetine.   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 ENT notes from 04/13/20 that sister provides most history but patient is communicative, "he is able to participate in his history, and answers questions appropriately." Referral was placed to radiation oncology, Dr Arthur Weaver. Per Arthur Garibaldi, Weaver (who works with Dr Arthur Weaver) a consult/visit has not been able to be arranged due to the patient's sister/POA not wanting him to know about his cancer.  Patient's Personal/Social situation: Patient cannot give informed consent to or refusal of treatment  due to intellectual disability. Resides in group home. His sister, Arthur Weaver, is a caring and involved decision maker and manages all his medical care.   Surrogate decision-maker and medical team are not aligned on next best steps: Per nurse Arthur Weaver, Arthur Weaver is worried that if the patient has knowledge of his cancer, this will be very traumatic for him emotionally and she wants to spare him this pain. Medical team has concerns that NOT telling the patient may not be ethically justified, and furthermore it may also be very possible for the patient to come to the knowledge of his diagnosis on his own in the cancer center or while undergoing radiation, surgery, other tests/treatment, which poses its own risk of psychological trauma.         Ethical assessments, analyses, justifications:  Question here centers around paternalism in the interest of beneficence/non-maleficence, versus respect for patient autonomy,   Informed consent not able to be obtained from patient without capacity/competence, so some form of paternalism is inevitable but should still respect patient autonomy whenever possible.   Paternalism is generally viewed as a less desirable ethical mode of medical decision-making (at least in [place] in which a patient's autonomy is largely prioritized first and foremost), because decisions are based on the judgments of someone other than the patient. Paternalism reinforces a power imbabalce between the less autonomous patient and the paternalistic decision-maker (family member, physician, etc).   There are certain instances in which paternalism is the logical and necessary default when decisions are being made regarding care of a patient who lacks capacity: when parents decide for minors especially for very young children, and/or when legal competent surrogate decision makers decide on behalf of an adult patient who lacks capacity/competence.   Paternalism  is generally  acceptable on the basis of beneficence - the person making decisions has the incompetent/incapacitant patient's best interests in mind. This may involve directly contradicting or overriding the patient's voiced wishes (high conflict, potential high risk), or it may involve gaining the patients' assent to the decision being made (low or no conflict/risk).   In this case, there is a specific request to withhold information from this patient with regard to his cancer diagnosis on the supposition that his knowledge of this diagnosis would be traumatic for him and would cause more harm than good.   The patient's sister clearly has a desire to do what is best for him and to save him undue psychological trauma.   However this presents the medical team with the possibility that they may end up doing things to this patient that he would not want or that he would not understand even if he has the capacity to understand them, and this is of course to be avoided if at all possible on the basis of respect for autonomy.   Arthur Weaver sister's request needs to be examined 1) in light of Arthur Weaver individual capacity to understand his situation and to guide the medical team in discussion of goals of care and 2) keeping in mind that non-competent/non-capacitant patients should still ideally ASSENT to procedures/tests even if they cannot legally CONSENT. Otherwise, if a treatment is to be undertaken, the medical team may have to consider reorting to physical force or chemical sedation in order for the patient to safely undergo a procedure, which itself involves physical risk that may be untenable to patients and staff, not to mention the psychological havoc it can wreak on the patient.   Keep in mind, legal competence is very different from medical capacity. This discussion involves capacity. Capacity can be determined by a consensus between two physicians. Capacity is also generally thought to be on a spectrum: a patient  can have capacity to decide on a soup versus a sandwich for lunch, even if they cannot reasonably comprehend the risks versus benefits of a complicated medical procedure such that they can provide informed consent or refusal of that procedure. Even in cases where lack of capacity makes informed consent from patient impossible, medical practitioners still strive for assent/agreement to procedures (common in older children for instance).  Relevant [place] Policy:   Informed Consent   Patient Competency/Guardianship        Recommendations:   Capacity has not been determined by oncology physician directly - medical record does mention that he is conversational and can answer questions. Informed Consent Policy would require a direct discussion be had with the patient/surrogate regarding the risks/benefits of cancer treatment, alternatives to treatment, etc. See policy for details. For a discussion on oncologic care / options, the oncology team needs to meet the patient and his sister. It may be reasonable, after a judgment is made by a physician regarding this patient's capacity to understand and participate in his care, to tell him or not tell him as follows:  If Arthur Weaver has the capability to understand his diagnosis, which arguably may be necessary for him to assent to procedures (especially daily radiation for a period of weeks), then the medical team has an ethical obligation to tell him about his diagnosis out of respect for his autonomy.   If Arthur Weaver does not have the capability to understand his diagnosis on any level whatsoever, but could reasonably assent to medical care such that it  would not likely have to be forced upon him by physical or chemical restraints, then it may be ethically justified to withhold information from him about his doagnosis. Medical team and sister must then very carefully consider how they would answer his questions of "what is happening?" or "what are you doing  to me?"   Additionally, withholding information on the basis that "the knowledge is more harmful than ignorance" is a double-edged sword. It cannot reliably be predicted how patients will respond to bad news. To Arthur Weaver, the idea of cancer may certainly be stressful/trauamtic, but how would he respond to knowing that his sister kept such an important thing a secret from him if he was to find out about the cancer? Who can say which would really be more traumatic for him: the diagnosis itself, or the betrayal of trust? The goal of beneficence/non-maleficence is admirable, but the true judgment of what is the "best benefit" or the "worst consequence" is very often subjective, so paternalistically making that judgment may not be ethically justifiable.          Thank you for this consult. Ethics will continue to follow this case.   Arthur Garibaldi, Weaver has my personal cell phone number and has my permission to share this number at their discretion. Secure message on Epic is also welcome but may not receive an immediate response.   Please reference AMION for on-call committee member if needed.    [place]

## 2020-05-11 NOTE — Progress Notes (Signed)
C-

## 2020-05-12 ENCOUNTER — Encounter (HOSPITAL_COMMUNITY): Payer: Self-pay

## 2020-05-12 ENCOUNTER — Telehealth (HOSPITAL_COMMUNITY): Payer: Self-pay

## 2020-05-12 NOTE — Progress Notes (Signed)
Head and Neck Cancer Location of Tumor / Histology:  Squamous cell carcinoma of glottis  Patient presented ~1 month ago with symptoms of: patient was referred to ENT by PCP for evaluation and treatment of hoarseness for the last 3 years. Patient's sister [name] is patient's guardian and helps provide history. She states that patient symptoms first started approximately 3 years ago, and at that time he had symptoms of a URI and severe sinusitis. She states that the sinus symptoms resolved, but since that time, she notes that his voice has not returned to baseline.  CT Neck w/ Contrast 05/07/2020 IMPRESSION: --Asymmetric prominence and enhancement of the left true vocal cord measuring 10 mm. Findings are compatible with the reported history of glottic squamous cell carcinoma. Abnormal enhancement may subtly extend to the anterior commissure. --There is a cluster of left level 4 lymph nodes which measure up to 9 mm in short axis, but are asymmetrically prominent and suspicious for nodal metastatic disease. The largest of these lymph nodes also has questionable abnormal internal hypodensity. Consider PET-CT for further evaluation.  Biopsies revealed:  04/20/2020 FINAL MICROSCOPIC DIAGNOSIS:  A. VOCAL CORD, LEFT TRUE, BIOPSY:  - Squamous cell carcinoma.  B. VOCAL CORD, LEFT TRUE, BIOPSY:  - Squamous cell carcinoma.  C. LARYNGEAL INLET, LEFT, BIOPSY:  - Squamous cell carcinoma.   Nutrition Status Yes No Comments  Weight changes? [x]  []  Sister reports weight has fluctuated up and down over the past 6 months  Swallowing concerns? []  [x]  Sister denies  PEG? []  [x]     Referrals Yes No Comments  Social Work? [x]  []    Dentistry? [x]  []  Has partial to lower/back of jaw  Swallowing therapy? [x]  []    Nutrition? [x]  []    Med/Onc? []  [x]     Safety Issues Yes No Comments  Prior radiation? []  [x]    Pacemaker/ICD? []  [x]    Possible current pregnancy? []  [x]  N/A  Is the patient on methotrexate? []   [x]     Tobacco/Marijuana/Snuff/ETOH use: Patient has never smoked, but did use chewing tobacco for ~5 years (quit 1994). Patient does not drink alcohol or use recreational drugs  Past/Anticipated interventions by otolaryngology, if any:  04/20/2020 Dr. Ebbie Latus MICROLARYNGOSCOPY WITH BIOPSY  Past/Anticipated interventions by medical oncology, if any:  No referral placed at this time  Current Complaints / other details:  Patient has received both Moderna vaccines as well as Moderna booster. IR has recommended that lymph nodes seen on most recent CT scan be biopsied to assess for metastasis.

## 2020-05-12 NOTE — Progress Notes (Signed)
[name] Male, 60 y.o., 1960/04/02  MRN:  [id number] Phone:  [phone] Jerilynn Mages)       PCP:  Velna Hatchet, MD Primary Cvg:  [employer] Hmo  Next Appt With Radiation Oncology 05/13/2020 at 8:30 AM           RE: biopsy Received: Today  Message Details  Corrie Mckusick, DO  Lovada Barwick E; P Ir Procedure Requests OK for attempt US guided FNA of left cervical supraclav nodes.    Hx of HN CA.   Earleen Newport    Previous Messages  ----- Message -----  From: Lenore Cordia  Sent: 05/12/2020 11:31 AM EDT  To: Ir Procedure Requests  Subject: biopsy                      Procedure Requested: CT US guided Biopsy    Reason for Procedure: enlarged left lower neck node    Provider Requesting: Ebbie Latus  Provider Telephone: [phone]    Other Info:

## 2020-05-13 ENCOUNTER — Ambulatory Visit
Admission: RE | Admit: 2020-05-13 | Discharge: 2020-05-13 | Disposition: A | Discharge status: Home / Self Care | Payer: Medicare HMO | Source: Ambulatory Visit | Attending: Radiation Oncology | Admitting: Radiation Oncology

## 2020-05-13 ENCOUNTER — Telehealth (HOSPITAL_COMMUNITY): Payer: Self-pay

## 2020-05-13 DIAGNOSIS — C320 Malignant neoplasm of glottis: Secondary | ICD-10-CM

## 2020-05-13 NOTE — Progress Notes (Addendum)
Oncology Nurse Navigator Documentation  Met with patient's sister during initial telephone consult with Dr. Isidore Moos. Patient's sister is Mr. Arthur Weaver's legal guardian and POA.  .  Introduced myself as his/their Navigator, explained my role as a member of the Care Team. . Assisted with post-consult appt scheduling.  She verbalized understanding of information provided and I encouraged her to call with questions/concerns moving forward.   Navigator Initial Assessment . Employment Status: unknown . Currently on FMLA / STD: no . Living Situation: He lives in a group home with support from his sister who lives a few miles away.  . Support System: Sister Arthur Weaver, legal guardian and POA.  Marland Kitchen PCP: Dr. Ardeth Perfect  . PCD: unknown . Financial Concerns:no . Transportation Needs: no . Sensory Deficits:no . Language Barriers/Interpreter Needed:  no . Ambulation Needs: no . DME Used in Home: no . Psychosocial Needs:  Patient has cognitive issues since birth. Per sister he has been determined to have between and 62 year old and 53 year old child cognitive abilities.  . Concerns/Needs Understanding Cancer:  addressed/answered by navigator to best of ability . Self-Expressed Needs: no   Harlow Asa RN, BSN, OCN Head & Neck Oncology Nurse [facility] Phone # [phone]  Fax # [phone]

## 2020-05-13 NOTE — Progress Notes (Incomplete)
Radiation Oncology         [phone] ________________________________  Initial Outpatient Consultation  Name: Arthur Weaver MRN: [id number]  Date: 05/13/2020  DOB: 1960/02/24  CC:Velna Hatchet, MD  Skotnicki, Meghan A, DO   REFERRING PHYSICIAN: Skotnicki, Meghan A, DO  DIAGNOSIS: No diagnosis found.  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 male who presented with three-year history of hoarseness.  Subsequently, the patient saw Dr. Fredric Dine, who performed a microlaryngoscopy with biopsy on 04/20/2020. 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  Pertinent imaging thus far includes soft tissue neck CT scan performed on 05/07/2020 revealing asymmetric prominence and enhancement of the left true vocal cord measuring 10 mm. 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 The abnormal enhancement may subtly extend to the anterior commissure. Additionally, there was a cluster of left level 4 lymph nodes that measured up to 9 mm in short axis but were asymmetrically prominent and suspicious for nodal metastatic disease. The largest of those nodes had questionable abnormal internal hypodensity.  Swallowing issues, if any: ***  Weight Changes: ***  Pain status: ***  Other symptoms: ***  Tobacco history, if any: ***  ETOH abuse, if any: ***  Prior cancers, if any: ***  PREVIOUS RADIATION THERAPY: {EXAM; YES/NO:19492::"No"}  PAST MEDICAL HISTORY:  has a past medical history of Anxiety, At risk for sleep apnea, BPH (benign prostatic hyperplasia), DDD (degenerative disc disease), cervical, DDD (degenerative disc disease), lumbar, Diverticulosis of colon, Family history of factor V deficiency, Frequency of urination, GERD (gastroesophageal reflux disease), History of adenomatous polyp of colon, Hyperlipidemia,  Hypertension, Lives in independent group home (resides at [facility] in [place], [place]), Mentally challenged, OA (osteoarthritis), Pre-diabetes, Psoriasis, Thoracic spondylosis, Urgency of urination, and Wears partial dentures.    PAST SURGICAL HISTORY: Past Surgical History:  Procedure Laterality Date  . CATARACT EXTRACTION W/ INTRAOCULAR LENS  IMPLANT, BILATERAL  2006  . CIRCUMCISION  03/29/2007   W/  CYSTOSCOPY AND TRANSRECTAL ULTRASOUND GUIDED PROSTATE BX'S  . COLONOSCOPY  last one 08-26-2015  . MICROLARYNGOSCOPY  04/20/2020   Procedure: MICROLARYNGOSCOPY WITH BIOPSY;  Surgeon: Jason Coop, DO;  Location: [facility] OR;  Service: ENT;;  . Marcelino Duster LASER TURP (TRANSURETHRAL RESECTION OF PROSTATE) N/A 04/21/2016   Procedure: THULIUM LASER TURP (TRANSURETHRAL RESECTION OF PROSTATE);  Surgeon: Carolan Clines, MD;  Location: [facility];  Service: Urology;  Laterality: N/A;    FAMILY HISTORY: family history includes Brain cancer in his mother; Breast cancer in his mother and sister; COPD in his sister; Cataracts in his sister; Crohn's disease in his father; Diabetes in his mother; Fibromyalgia in his sister; GER disease in his brother, brother, father, and sister; Heart attack in his brother; Hypertension in his mother; Hypothyroidism in his sister; Liver cancer in his brother; Osteoarthritis in his sister; Rheum arthritis in his father; Stroke in his brother; Ulcers in his father.  SOCIAL HISTORY:  reports that he has never smoked. He quit smokeless tobacco use about 28 years ago.  His smokeless tobacco use included chew. He reports that he does not drink alcohol and does not use drugs.  ALLERGIES: Penicillins, Bacitracin, Imidurea, Methylisothiazolinone, Neomycin, Polymyxin b, Pork-derived products, Sulfa antibiotics, and Urea  MEDICATIONS:  Current Outpatient Medications  Medication Sig Dispense Refill  . acetaminophen (TYLENOL) 325 MG tablet Take 650 mg  by  mouth every 4 (four) hours as needed for moderate pain or headache.    Marland Kitchen amLODipine (NORVASC) 5 MG tablet Take 5 mg by mouth daily.     Marland Kitchen aspirin 81 MG tablet Take 81 mg by mouth daily.    . Calcium Carb-Cholecalciferol (CALCIUM+D3) 600-800 MG-UNIT TABS Take 1 tablet by mouth daily.    . Cholecalciferol (VITAMIN D3) 1000 units CAPS Take 1,000 Units by mouth daily at 2 am.    . diclofenac Sodium (VOLTAREN) 1 % GEL Apply 2 g to 4 g to the affected area up to 4 times daily as needed. (Patient taking differently: Apply 3 g topically 3 (three) times daily.) 400 g 4  . docusate sodium (COLACE) 100 MG capsule TAKE 1 CAPSULE (100 MG TOTAL) BY MOUTH 2 (TWO) TIMES DAILY AS NEEDED FOR UP TO 3 DAYS. 6 capsule 0  . FLUoxetine (PROZAC) 20 MG capsule TAKE 1 CAPSULE BY MOUTH EVERY DAY IN THE MORNING WITH FOOD (Patient taking differently: Take 20 mg by mouth daily.) 30 capsule 5  . HYDROcodone-acetaminophen (NORCO/VICODIN) 5-325 MG tablet TAKE 1 TABLET BY MOUTH EVERY 8 (EIGHT) HOURS AS NEEDED FOR UP TO 3 DAYS FOR MODERATE PAIN. 9 tablet 0  . HYDROcodone-acetaminophen (NORCO/VICODIN) 5-325 MG tablet TAKE 1 TABLET BY MOUTH EVERY 8 (EIGHT) HOURS AS NEEDED FOR UP TO 3 DAYS FOR MODERATE PAIN. 9 tablet 0  . Ibuprofen 200 MG CAPS Take 1 capsule (200 mg total) by mouth 3 (three) times daily. (Patient taking differently: Take 200 mg by mouth 4 (four) times daily as needed (inflammation/ pain).) 90 each 3  . lisinopril (PRINIVIL,ZESTRIL) 40 MG tablet Take 40 mg by mouth daily.     . Multiple Vitamin (MULTIVITAMIN PO) Take 1 tablet by mouth every evening.    . nabumetone (RELAFEN) 500 MG tablet Take 500 mg by mouth daily as needed (Arthritis pain).    Marland Kitchen omeprazole (PRILOSEC) 20 MG capsule Take 20 mg by mouth at bedtime.    . rosuvastatin (CRESTOR) 5 MG tablet Take 5 mg by mouth daily.    . tamsulosin (FLOMAX) 0.4 MG CAPS capsule 0.4 mg daily after breakfast.    . triamcinolone cream (KENALOG) 0.1 % Apply 1 application  topically 2 (two) times daily as needed. (Patient taking differently: Apply 1 application topically 3 (three) times daily.) 453.6 g 0   No current facility-administered medications for this encounter.    REVIEW OF SYSTEMS:  Notable for that above.   PHYSICAL EXAM:  vitals were not taken for this visit.   General: Alert and oriented, in no acute distress HEENT: Head is normocephalic. Extraocular movements are intact. Oropharynx is notable for ***. Neck: Neck is notable for *** Heart: Regular in rate and rhythm with no murmurs, rubs, or gallops. Chest: Clear to auscultation bilaterally, with no rhonchi, wheezes, or rales. Abdomen: Soft, nontender, nondistended, with no rigidity or guarding. Extremities: No cyanosis or edema. Lymphatics: see Neck Exam Skin: No concerning lesions. Musculoskeletal: symmetric strength and muscle tone throughout. Neurologic: Cranial nerves II through XII are grossly intact. No obvious focalities. Speech is fluent. Coordination is intact. Psychiatric: Judgment and insight are intact. Affect is appropriate.   ECOG = ***  0 - Asymptomatic (Fully active, able to carry on all predisease activities without restriction)  1 - Symptomatic but completely ambulatory (Restricted in physically strenuous activity but ambulatory and able to carry out work of a light or sedentary nature. For example, light housework, office work)  2 - Symptomatic, <50% in  bed during the day (Ambulatory and capable of all self care but unable to carry out any work activities. Up and about more than 50% of waking hours)  3 - Symptomatic, >50% in bed, but not bedbound (Capable of only limited self-care, confined to bed or chair 50% or more of waking hours)  4 - Bedbound (Completely disabled. Cannot carry on any self-care. Totally confined to bed or chair)  5 - Death   Eustace Pen MM, Creech RH, Tormey DC, et al. [phone]). "Toxicity and response criteria of the [facility]".  [place] Oncol. 5 (6): 649-55   LABORATORY DATA:  Lab Results  Component Value Date   WBC 9.0 04/20/2020   HGB 13.6 04/20/2020   HCT 41.1 04/20/2020   MCV 90.7 04/20/2020   PLT 317 04/20/2020   CMP     Component Value Date/Time   NA 138 04/20/2020 0605   K 3.7 04/20/2020 0605   CL 102 04/20/2020 0605   CO2 29 04/20/2020 0605   GLUCOSE 110 (H) 04/20/2020 0605   BUN 16 04/20/2020 0605   CREATININE 0.74 04/20/2020 0605   CALCIUM 8.7 (L) 04/20/2020 0605   GFRNONAA >60 04/20/2020 0605   GFRAA >60 10/08/2016 2111      No results found for: TSH   RADIOGRAPHY: CT SOFT TISSUE NECK W CONTRAST  Result Date: 05/07/2020 CLINICAL DATA:  Squamous cell carcinoma of the glottis. Additional history provided by technologist: Squamous cell carcinoma of the glottis (confirmed with biopsy), hoarseness, symptoms for years. EXAM: CT NECK WITH CONTRAST TECHNIQUE: Multidetector CT imaging of the neck was performed using the standard protocol following the bolus administration of intravenous contrast. CONTRAST:  72mL ISOVUE-300 IOPAMIDOL (ISOVUE-300) INJECTION 61% COMPARISON:  CT head/maxillofacial 10/08/2016. FINDINGS: Pharynx and larynx: Streak artifact from dental restoration partially obscures the oral cavity. No appreciable swelling or discrete mass within the oral cavity or pharynx. Postinflammatory calcifications within the palatine tonsils bilaterally. There is asymmetric prominence and enhancement of the left true vocal cord measuring 10 mm (series 3, image 64). Abnormal enhancement may subtly extend to the anterior commissure. Salivary glands: No inflammation, mass, or stone. Thyroid: Unremarkable. Lymph nodes: There is a cluster of left level 4 lymph nodes which measure up to 9 mm in short axis, but are asymmetrically prominent and suspicious. The largest of these lymph nodes has questionable abnormal internal hypodensity. Vascular: Atherosclerotic plaque within the visualized aortic arch and  proximal major branch vessels of the neck, as well as carotid arteries. Limited intracranial: No evidence of acute intracranial abnormality within the field of view. Visualized orbits: Incompletely imaged. No mass or acute finding at the imaged levels. Mastoids and visualized paranasal sinuses: No significant paranasal sinus disease or mastoid effusion at the imaged levels. Skeleton: No acute bony abnormality or aggressive osseous lesion. Upper chest: No consolidation within the imaged lung apices. IMPRESSION: Asymmetric prominence and enhancement of the left true vocal cord measuring 10 mm. Findings are compatible with the reported history of glottic squamous cell carcinoma. Abnormal enhancement may subtly extend to the anterior commissure. There is a cluster of left level 4 lymph nodes which measure up to 9 mm in short axis, but are asymmetrically prominent and suspicious for nodal metastatic disease. The largest of these lymph nodes also has questionable abnormal internal hypodensity. Consider PET-CT for further evaluation. Electronically Signed   By: Kellie Simmering DO   On: 05/07/2020 11:14      IMPRESSION/PLAN: Glottic cancer  This is a delightful patient  with head and neck cancer. I *** recommend radiotherapy for this patient.  We discussed the potential risks, benefits, and side effects of radiotherapy. We talked in detail about acute and late effects. We discussed that some of the most bothersome acute effects may be mucositis, dysgeusia, salivary changes, skin irritation, hair loss, dehydration, weight loss and fatigue. We talked about late effects which include but are not necessarily limited to dysphagia, hypothyroidism, nerve injury, vascular injury, spinal cord injury, xerostomia, trismus, neck edema, and potential injury to any of the tissues in the head and neck region. No guarantees of treatment were given. A consent form was signed and placed in the patient's medical record. The patient is  enthusiastic about proceeding with treatment. I look forward to participating in the patient's care.    Simulation (treatment planning) will take place ***  We also discussed that the treatment of head and neck cancer is a multidisciplinary process to maximize treatment outcomes and quality of life. For this reason the following referrals have been or will be made:  *** Medical oncology to discuss chemotherapy   *** Dentistry for dental evaluation, possible extractions in the radiation fields, and /or advice on reducing risk of cavities, osteoradionecrosis, or other oral issues.  *** Nutritionist for nutrition support during and after treatment.  *** Speech language pathology for swallowing and/or speech therapy.  *** Social work for social support.   *** Physical therapy due to risk of lymphedema in neck and deconditioning.  *** Baseline labs including TSH.  On date of service, in total, I spent *** minutes on this encounter. Patient was seen in person.  __________________________________________   Eppie Gibson, MD  This document serves as a record of services personally performed by Eppie Gibson, MD. It was created on his behalf by Clerance Lav, a trained medical scribe. The creation of this record is based on the scribe's personal observations and the provider's statements to them. This document has been checked and approved by the attending provider.

## 2020-05-13 NOTE — Progress Notes (Signed)
Radiation Oncology         [phone] ________________________________  Initial Outpatient Consultation by telephone.  The patient opted for telemedicine to maximize safety during the pandemic.  MyChart video was not obtainable.   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 male who presented with three-year history of hoarseness.  Subsequently, the patient saw Dr. Fredric Dine, who performed a microlaryngoscopy with biopsy on 04/20/2020. 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  She reports that his clinical stage is T1a.  Pertinent imaging thus far includes soft tissue neck CT scan performed on 05/07/2020 revealing asymmetric prominence and enhancement of the left true vocal cord measuring 10 mm. 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 The abnormal enhancement may subtly extend to the anterior commissure. Additionally, there was a cluster of left level 4 lymph nodes that measured up to 9 mm in short axis but were asymmetrically prominent and suspicious for nodal metastatic disease. The largest of those nodes had questionable abnormal internal hypodensity.  I have personally reviewed his images.  ENT ordered ultrasound-guided biopsy of these nodes but the patient's sister canceled that appointment because she was not aware of  why it had been ordered.  Weight change, if any: sister reports weight has waxed and waned for months  Swallowing: Good function per sister  Tobacco history, if any: Chewed tobacco for 5 years and quit in 1994.  Has never smoked  ETOH abuse, if any: None    He lives in group home 3 miles from his sister's home. Mother died of breast cancer about 15 years ago and this was quite traumatic for him.  He associates any type of cancer with death.  His sister reports that he has had anxiety about medical procedures thus far.  He has not been told that he has cancer.  She believes that this would be extremely traumatic for him and that he would be convinced that he will die if he is told he has cancer.  The patient's sister is his guardian and has power of attorney.  She reports that he has the intellectual capacity of a young elementary school aged child and she has been protective of him since they were children.  She is devoted to his wellbeing and visits him often.  She had concerns about the patient's anxiety levels and risk for trauma at an initial consultation without her speaking with me first. She stated that she would not speak with me unless it was on her own, apart from the patient, at a telemedicine visit.  In order to start lines of communication, build rapport, and help expedite the patient's oncologic treatments, this initial consultation was scheduled to her wishes with her only.   PREVIOUS RADIATION THERAPY: No  PAST MEDICAL HISTORY:  has a past medical history of Anxiety, At risk for  sleep apnea, BPH (benign prostatic hyperplasia), DDD (degenerative disc disease), cervical, DDD (degenerative disc disease), lumbar, Diverticulosis of colon, Family history of factor V deficiency, Frequency of urination, GERD (gastroesophageal reflux disease), History of adenomatous polyp of colon, Hyperlipidemia, Hypertension, Lives in independent group home (resides at [facility] in  [place], [place]), Mentally challenged, OA (osteoarthritis), Pre-diabetes, Psoriasis, Thoracic spondylosis, Urgency of urination, and Wears partial dentures.    PAST SURGICAL HISTORY: Past Surgical History:  Procedure Laterality Date  . CATARACT EXTRACTION W/ INTRAOCULAR LENS  IMPLANT, BILATERAL  2006  . CIRCUMCISION  03/29/2007   W/  CYSTOSCOPY AND TRANSRECTAL ULTRASOUND GUIDED PROSTATE BX'S  . COLONOSCOPY  last one 08-26-2015  . MICROLARYNGOSCOPY  04/20/2020   Procedure: MICROLARYNGOSCOPY WITH BIOPSY;  Surgeon: Jason Coop, DO;  Location: [facility] OR;  Service: ENT;;  . Marcelino Duster LASER TURP (TRANSURETHRAL RESECTION OF PROSTATE) N/A 04/21/2016   Procedure: THULIUM LASER TURP (TRANSURETHRAL RESECTION OF PROSTATE);  Surgeon: Carolan Clines, MD;  Location: [facility];  Service: Urology;  Laterality: N/A;    FAMILY HISTORY: family history includes Brain cancer in his mother; Breast cancer in his mother and sister; COPD in his sister; Cataracts in his sister; Crohn's disease in his father; Diabetes in his mother; Fibromyalgia in his sister; GER disease in his brother, brother, father, and sister; Heart attack in his brother; Hypertension in his mother; Hypothyroidism in his sister; Liver cancer in his brother; Osteoarthritis in his sister; Rheum arthritis in his father; Stroke in his brother; Ulcers in his father.  SOCIAL HISTORY:  reports that he has never smoked. He quit smokeless tobacco use about 28 years ago.  His smokeless tobacco use included chew. He reports that he does not drink alcohol and does not use drugs.  ALLERGIES: Penicillins, Bacitracin, Imidurea, Methylisothiazolinone, Neomycin, Polymyxin b, Pork-derived products, Sulfa antibiotics, and Urea  MEDICATIONS:  Current Outpatient Medications  Medication Sig Dispense Refill  . acetaminophen (TYLENOL) 325 MG tablet Take 650 mg by mouth every 4 (four) hours as needed for moderate pain or headache.    Marland Kitchen amLODipine  (NORVASC) 5 MG tablet Take 5 mg by mouth daily.     Marland Kitchen aspirin 81 MG tablet Take 81 mg by mouth daily.    . Calcium Carb-Cholecalciferol (CALCIUM+D3) 600-800 MG-UNIT TABS Take 1 tablet by mouth daily.    . Cholecalciferol (VITAMIN D3) 1000 units CAPS Take 1,000 Units by mouth daily at 2 am.    . diclofenac Sodium (VOLTAREN) 1 % GEL Apply 2 g to 4 g to the affected area up to 4 times daily as needed. (Patient taking differently: Apply 3 g topically 3 (three) times daily.) 400 g 4  . docusate sodium (COLACE) 100 MG capsule TAKE 1 CAPSULE (100 MG TOTAL) BY MOUTH 2 (TWO) TIMES DAILY AS NEEDED FOR UP TO 3 DAYS. 6 capsule 0  . FLUoxetine (PROZAC) 20 MG capsule TAKE 1 CAPSULE BY MOUTH EVERY DAY IN THE MORNING WITH FOOD (Patient taking differently: Take 20 mg by mouth daily.) 30 capsule 5  . HYDROcodone-acetaminophen (NORCO/VICODIN) 5-325 MG tablet TAKE 1 TABLET BY MOUTH EVERY 8 (EIGHT) HOURS AS NEEDED FOR UP TO 3 DAYS FOR MODERATE PAIN. 9 tablet 0  . HYDROcodone-acetaminophen (NORCO/VICODIN) 5-325 MG tablet TAKE 1 TABLET BY MOUTH EVERY 8 (EIGHT) HOURS AS NEEDED FOR UP TO 3 DAYS FOR MODERATE PAIN. 9 tablet 0  . Ibuprofen 200 MG CAPS Take 1 capsule (200 mg total) by mouth 3 (three) times daily. (Patient taking  differently: Take 200 mg by mouth 4 (four) times daily as needed (inflammation/ pain).) 90 each 3  . lisinopril (PRINIVIL,ZESTRIL) 40 MG tablet Take 40 mg by mouth daily.     . Multiple Vitamin (MULTIVITAMIN PO) Take 1 tablet by mouth every evening.    . nabumetone (RELAFEN) 500 MG tablet Take 500 mg by mouth daily as needed (Arthritis pain).    Marland Kitchen omeprazole (PRILOSEC) 20 MG capsule Take 20 mg by mouth at bedtime.    . rosuvastatin (CRESTOR) 5 MG tablet Take 5 mg by mouth daily.    . tamsulosin (FLOMAX) 0.4 MG CAPS capsule 0.4 mg daily after breakfast.    . triamcinolone cream (KENALOG) 0.1 % Apply 1 application topically 2 (two) times daily as needed. (Patient taking differently: Apply 1 application  topically 3 (three) times daily.) 453.6 g 0   No current facility-administered medications for this encounter.    REVIEW OF SYSTEMS:  Notable for that above.   PHYSICAL EXAM:  vitals were not taken for this visit.     LABORATORY DATA:  Lab Results  Component Value Date   WBC 9.0 04/20/2020   HGB 13.6 04/20/2020   HCT 41.1 04/20/2020   MCV 90.7 04/20/2020   PLT 317 04/20/2020   CMP     Component Value Date/Time   NA 138 04/20/2020 0605   K 3.7 04/20/2020 0605   CL 102 04/20/2020 0605   CO2 29 04/20/2020 0605   GLUCOSE 110 (H) 04/20/2020 0605   BUN 16 04/20/2020 0605   CREATININE 0.74 04/20/2020 0605   CALCIUM 8.7 (L) 04/20/2020 0605   GFRNONAA >60 04/20/2020 0605   GFRAA >60 10/08/2016 2111      No results found for: TSH   RADIOGRAPHY: CT SOFT TISSUE NECK W CONTRAST  Result Date: 05/07/2020 CLINICAL DATA:  Squamous cell carcinoma of the glottis. Additional history provided by technologist: Squamous cell carcinoma of the glottis (confirmed with biopsy), hoarseness, symptoms for years. EXAM: CT NECK WITH CONTRAST TECHNIQUE: Multidetector CT imaging of the neck was performed using the standard protocol following the bolus administration of intravenous contrast. CONTRAST:  65m ISOVUE-300 IOPAMIDOL (ISOVUE-300) INJECTION 61% COMPARISON:  CT head/maxillofacial 10/08/2016. FINDINGS: Pharynx and larynx: Streak artifact from dental restoration partially obscures the oral cavity. No appreciable swelling or discrete mass within the oral cavity or pharynx. Postinflammatory calcifications within the palatine tonsils bilaterally. There is asymmetric prominence and enhancement of the left true vocal cord measuring 10 mm (series 3, image 64). Abnormal enhancement may subtly extend to the anterior commissure. Salivary glands: No inflammation, mass, or stone. Thyroid: Unremarkable. Lymph nodes: There is a cluster of left level 4 lymph nodes which measure up to 9 mm in short axis, but are  asymmetrically prominent and suspicious. The largest of these lymph nodes has questionable abnormal internal hypodensity. Vascular: Atherosclerotic plaque within the visualized aortic arch and proximal major branch vessels of the neck, as well as carotid arteries. Limited intracranial: No evidence of acute intracranial abnormality within the field of view. Visualized orbits: Incompletely imaged. No mass or acute finding at the imaged levels. Mastoids and visualized paranasal sinuses: No significant paranasal sinus disease or mastoid effusion at the imaged levels. Skeleton: No acute bony abnormality or aggressive osseous lesion. Upper chest: No consolidation within the imaged lung apices. IMPRESSION: Asymmetric prominence and enhancement of the left true vocal cord measuring 10 mm. Findings are compatible with the reported history of glottic squamous cell carcinoma. Abnormal enhancement may subtly extend to the anterior  commissure. There is a cluster of left level 4 lymph nodes which measure up to 9 mm in short axis, but are asymmetrically prominent and suspicious for nodal metastatic disease. The largest of these lymph nodes also has questionable abnormal internal hypodensity. Consider PET-CT for further evaluation. Electronically Signed   By: Kellie Simmering DO   On: 05/07/2020 11:14      IMPRESSION/PLAN: Glottic cancer - clinical T1N0  I have spoken about this patient extensively with his sister [name] (by phone today) as well as Dr. Fredric Dine and Dr. Ardeth Perfect. Dr. Ardeth Perfect, his PCP, confirms that the patient has limited verbal and intellectual skills.  He confirms that the patient's sister has been quite devoted to his care and his wellbeing.  Dr Fredric Dine is going to order an addendum of the pathology report.  The supraglottis was not involved.  This is a T1aN0 glottic cancer.  She reports that his cords were mobile symmetrically on laryngoscopy.  She favors radiation therapy for his treatment rather than  surgical techniques.  I let the patient's sister know that the value of a ultrasound-guided biopsy of the lymph nodes in his neck is to confirm that he has no nodal involvement.  This would confirm that only his larynx needs to be treated with radiation therapy.  His treatment would be quite different if he has nodal involvement.  A PET scan is not a great option for him because the lymph nodes are small and unlikely to show significant hypermetabolic activity on the PET.  I explained that early stage glottic cancer very rarely metastasizes and rarely goes to lymph nodes  We discussed the potential risks, benefits, and side effects of radiotherapy. We talked in detail about acute and late effects. We discussed that some of the most bothersome acute effects may be mucositis, dysgeusia, salivary changes, skin irritation, hair loss, dehydration, weight loss and fatigue. We talked about late effects which include but are not necessarily limited to dysphagia, hypothyroidism, nerve injury, vascular injury, spinal cord injury, xerostomia, trismus, neck edema, and potential injury to any of the tissues in the head and neck region. No guarantees of treatment were given.  I recommend a 4-week hypofractionated treatment regimen which is the most realistic treatment regimen given his other medical and developmental conditions.  The patient's sister and I spoke for a while about communication with the patient himself.  I will take all of her feedback into consideration very carefully and value the input from [place] and Dr. Ardeth Perfect.  I will assess the patient's capacity and his communication skills when I meet him.  I told the patient's sister that I cannot guarantee how I will communicate with him because I have not met him or assessed him yet.  She understands that if I think it is in his best interest to mention his cancer diagnosis outright, I will need to do so in keeping with ethical standards, albeit it in a  compassionate and gentle manner that provides context and reassurance.   I let her know that I am happy to help facilitate a second opinion if she wishes to receive one.  She declines that at this time.  Simulation (treatment planning) will take place after the disposition regarding his lymph nodes has been established.  I will meet the patient before we plan his treatment.  Head and neck cancer is a multidisciplinary process to maximize treatment outcomes and quality of life. For this reason the following referrals have been or will be made:  Nutritionist for nutrition support during and after treatment.   Social work for social support.    Baseline labs including TSH.  I did recommend speech-language pathology consultation due to dysphagia risk but she reports that her brother will not have the capacity to follow through with any instructions.  For now she will decline for this.  I look forward to participating in his care and meeting both Mr. Denomme and [name] in the near future once his work-up is complete.  This encounter was provided by telemedicine platform; patient desired telemedicine during pandemic precautions.  MyChart video was not available and therefore telephone was used. The patient has given verbal consent for this type of encounter and has been advised to only accept a meeting of this type in a secure network environment. On date of service, in total, I spent 60 minutes on this encounter.   The attendants for this meeting include Eppie Gibson  and [name] During the encounter, Eppie Gibson was located at [facility] Radiation Oncology Department.  This patient has limited verbal and intellectual skills and therefore this was conducted with his sister (at her request) who was located at home.    __________________________________________   Eppie Gibson, MD  This document serves as a record of services personally performed by Eppie Gibson, MD. It  was created on his behalf by Clerance Lav, a trained medical scribe. The creation of this record is based on the scribe's personal observations and the provider's statements to them. This document has been checked and approved by the attending provider.

## 2020-05-14 LAB — SURGICAL PATHOLOGY

## 2020-05-15 ENCOUNTER — Encounter: Payer: Self-pay | Admitting: Radiation Oncology

## 2020-05-15 NOTE — Progress Notes (Signed)
Phone note: I received a message from [place], our navigator, that the patient's sister  [name] called with questions after doing some research yesterday. She has had to cancel his biopsy scheduled for 4/15 due to her personal issues. She asks if a PET scan can be done instead of a biopsy.  She is concerned about the cancer spreading to other parts of the body.  I then left  a message on [name]'s voicemail -  I let her know that a PET scan is unlikely to answer whether those lymph nodes are malignant because the lymph nodes are too small to reliably light up.  I recommended that we proceed with biopsy of the lymph nodes and if they are negative it is highly unlikely that the disease has spread to lymph nodes or other parts of the body.  I also do not think that his insurance will cover a PET scan if we have no proof of lymph node involvement.  But if the lymph nodes are positive (which would be unlikely) then we can consider a PET scan to follow.   I then sent a message to Anderson Malta asking her to check in with the patient's sister next week to see if she would like to proceed with biopsy as recommended.   -----------------------------------  Eppie Gibson, MD

## 2020-05-19 NOTE — Progress Notes (Addendum)
Oncology Nurse Navigator Documentation  I called and spoke to Arthur Weaver (patient's sister and guardian) today. She is aware of Dr. Pearlie Oyster recommendations to reschedule the biopsy as soon as possible. She tells me that at this time she is not exactly sure how she plans to proceed. She does plan to call Arthur Weaver's insurance and inquire about the cost of a PET scan. She is aware that a biopsy is recommended to determine treatment planning for Arthur Weaver. I also made her aware that it is important to proceed with treatment to ensure the best outcome of Arthur Weaver treatment. I asked her to call me when she has made a decision regarding the biopsy. I will follow up early next week if I have not heard from her by that time.   Harlow Asa RN, BSN, OCN Head & Neck Oncology Nurse [place] at [facility] Phone # [phone]  Fax # [phone]

## 2020-05-22 ENCOUNTER — Ambulatory Visit (HOSPITAL_COMMUNITY): Admission: RE | Admit: 2020-05-22 | Payer: Medicare HMO | Source: Ambulatory Visit

## 2020-05-27 ENCOUNTER — Other Ambulatory Visit: Payer: Self-pay

## 2020-05-27 ENCOUNTER — Telehealth: Payer: Self-pay | Admitting: *Deleted

## 2020-05-27 DIAGNOSIS — C320 Malignant neoplasm of glottis: Secondary | ICD-10-CM

## 2020-05-27 NOTE — Telephone Encounter (Signed)
Called patient's sister [name]) to inform of Pet Scan on 06-03-20- arrival time- 2:30 pm , @ [facility]  patient to be NPO- 6 hrs. prior to test, patient to abstain from carbs and sugars the last meal day before and he should hold his insul. - 8 hrs. Prior to test (if he takes insu.), lvm for a return call

## 2020-06-03 ENCOUNTER — Other Ambulatory Visit: Payer: Self-pay

## 2020-06-03 ENCOUNTER — Ambulatory Visit (HOSPITAL_COMMUNITY)
Admission: RE | Admit: 2020-06-03 | Discharge: 2020-06-03 | Disposition: A | Discharge status: Home / Self Care | Payer: Medicare HMO | Source: Ambulatory Visit | Attending: Radiation Oncology | Admitting: Radiation Oncology

## 2020-06-03 DIAGNOSIS — I700 Atherosclerosis of aorta: Secondary | ICD-10-CM | POA: Diagnosis not present

## 2020-06-03 DIAGNOSIS — R918 Other nonspecific abnormal finding of lung field: Secondary | ICD-10-CM | POA: Diagnosis not present

## 2020-06-03 DIAGNOSIS — C760 Malignant neoplasm of head, face and neck: Secondary | ICD-10-CM | POA: Diagnosis not present

## 2020-06-03 DIAGNOSIS — C320 Malignant neoplasm of glottis: Secondary | ICD-10-CM | POA: Diagnosis not present

## 2020-06-03 LAB — GLUCOSE, CAPILLARY: Glucose-Capillary: 99 mg/dL (ref 70–99)

## 2020-06-03 MED ORDER — FLUDEOXYGLUCOSE F - 18 (FDG) INJECTION
10.1000 | Freq: Once | INTRAVENOUS | Status: AC | PRN
  Administered 2020-06-03: 10.8 via INTRAVENOUS

## 2020-06-04 DIAGNOSIS — N138 Other obstructive and reflux uropathy: Secondary | ICD-10-CM | POA: Diagnosis not present

## 2020-06-04 DIAGNOSIS — N312 Flaccid neuropathic bladder, not elsewhere classified: Secondary | ICD-10-CM | POA: Diagnosis not present

## 2020-06-04 DIAGNOSIS — R3915 Urgency of urination: Secondary | ICD-10-CM | POA: Diagnosis not present

## 2020-06-04 DIAGNOSIS — R339 Retention of urine, unspecified: Secondary | ICD-10-CM | POA: Diagnosis not present

## 2020-06-04 DIAGNOSIS — N401 Enlarged prostate with lower urinary tract symptoms: Secondary | ICD-10-CM | POA: Diagnosis not present

## 2020-06-05 NOTE — Progress Notes (Signed)
Oncology Nurse Navigator Documentation  At Dr. Pearlie Oyster request I called and left a voice mail with Mr. Cuff guardian and sister, [name]. I provided yesterday's results of his PET scan and Dr. Pearlie Oyster request to meet her and her brother in person for physical exam and possible CT simulation. I provided my direct contact information and requested a call back as soon as possible to discuss the above.  Harlow Asa RN, BSN, OCN Head & Neck Oncology Nurse [place] at [facility] Phone # [phone]  Fax # [phone]

## 2020-06-08 ENCOUNTER — Ambulatory Visit (INDEPENDENT_AMBULATORY_CARE_PROVIDER_SITE_OTHER): Payer: Medicare HMO | Admitting: Psychiatry

## 2020-06-08 ENCOUNTER — Other Ambulatory Visit: Payer: Self-pay

## 2020-06-08 DIAGNOSIS — F79 Unspecified intellectual disabilities: Secondary | ICD-10-CM | POA: Diagnosis not present

## 2020-06-08 NOTE — Progress Notes (Signed)
[place] MD/PA/NP OP Progress Note  06/08/2020 10:29 AM Arthur Weaver  MRN:  [id number]   Chief Complaint: Returns for medication management appointment (Patient has been followed by Arthur Weaver, who has retired. I am providing outpatient psychiatric management in the interim)   HPI: 60 yo single white male with IDD (severity unspecified). He lives in a residential setting ([place]).  Presents with sister, who is his legal guardian and [facility] POA and who provides information.  She explains that [place] pandemic has been difficult for patient due to restrictions , less opportunities of going outside/socializing, changing routines, concerns of contracting disease.  Currently Arthur Weaver presents attentive, pleasant and exhibits full range of affect.  He looks to his sister for answering most questions.  He describes his mood as normal and does not currently endorse significant depression or neurovegetative symptoms.  No SI.  No psychotic symptoms noted or reported.   It appears that recent relaxing of COVID-related restrictions at his place of residence has helped .   Note patient has had an established primary care doctor who has been renewing Prozac. 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 Patient's sister states that oncology referral is in process .  No side effects reported, it appears Prozac has been helpful and well-tolerated.    Visit Diagnosis:  No diagnosis found.  Past Psychiatric History: Please see intake H&P.  Past Medical History:  Past Medical History:  Diagnosis Date  . Anxiety   . At risk for sleep apnea    STOP-BANG= 5       SENT TO PCP 04-14-2016  . BPH (benign prostatic hyperplasia)   . DDD (degenerative disc disease), cervical   . DDD (degenerative disc disease), lumbar   . Diverticulosis of colon   . Family history of factor V deficiency    per pt's sister (whom is pt's guardian) she, pt's brother and  parents have factor V but pt has never been tested  . Frequency of urination   . GERD (gastroesophageal reflux disease)   . History of adenomatous polyp of colon    2006 and 03-18-2010 tubular adenoma  . Hyperlipidemia   . Hypertension   . Lives in independent group home resides at [facility] in [place], [place]   pt independant w/ ADLs w/ exception does not cook for shop   . Mentally challenged    SPECIAL NEEDS  . OA (osteoarthritis)    hands, knees, feet  . Pre-diabetes   . Psoriasis    per patient's sister  . Thoracic spondylosis   . Urgency of urination   . Wears partial dentures    lower    Past Surgical History:  Procedure Laterality Date  . CATARACT EXTRACTION W/ INTRAOCULAR LENS  IMPLANT, BILATERAL  2006  . CIRCUMCISION  03/29/2007   W/  CYSTOSCOPY AND TRANSRECTAL ULTRASOUND GUIDED PROSTATE BX'S  . COLONOSCOPY  last one 08-26-2015  . MICROLARYNGOSCOPY  04/20/2020   Procedure: MICROLARYNGOSCOPY WITH BIOPSY;  Surgeon: Jason Coop, DO;  [facility]: [facility];  Service: ENT;;  . Marcelino Duster LASER TURP (TRANSURETHRAL RESECTION OF PROSTATE) N/A 04/21/2016   Procedure: THULIUM LASER TURP (TRANSURETHRAL RESECTION OF PROSTATE);  Surgeon: Carolan Clines, MD;  Location: [facility];  Service: Urology;  Laterality: N/A;    Family Psychiatric History: None  Family History:  Family History  Problem Relation Age of Onset  . Breast cancer Mother   . Brain cancer  Mother   . Hypertension Mother   . Diabetes Mother   . Crohn's disease Father   . Rheum arthritis Father   . GER disease Father   . Ulcers Father   . Cataracts Sister   . Fibromyalgia Sister   . COPD Sister   . Breast cancer Sister   . GER disease Sister   . Hypothyroidism Sister   . Osteoarthritis Sister   . GER disease Brother   . GER disease Brother   . Stroke Brother   . Liver cancer Brother   . Heart attack Brother   . Colon cancer Neg Hx     Social History:  Social  History   Socioeconomic History  . Marital status: Single    Spouse name: Not on file  . Number of children: Not on file  . Years of education: Not on file  . Highest education level: Not on file  Occupational History  . Not on file  Tobacco Use  . Smoking status: Never Smoker  . Smokeless tobacco: Former Systems developer    Types: Secondary school teacher  . Vaping Use: Never used  Substance and Sexual Activity  . Alcohol use: No    Alcohol/week: 0.0 standard drinks  . Drug use: No  . Sexual activity: Not on file  Other Topics Concern  . Not on file  Social History Narrative  . Not on file   Social Determinants of Health   Financial Resource Strain: Not on file  Food Insecurity: Not on file  Transportation Needs: Not on file  Physical Activity: Not on file  Stress: Not on file  Social Connections: Not on file    Allergies:  Allergies  Allergen Reactions  . Penicillins Shortness Of Breath    "couldn't breathe" Has patient had a PCN reaction causing immediate rash, facial/tongue/throat swelling, SOB or lightheadedness with hypotension: No Has patient had a PCN reaction causing severe rash involving mucus membranes or skin necrosis: No Has patient had a PCN reaction that required hospitalization: No Has patient had a PCN reaction occurring within the last 10 years: No If all of the above answers are "NO", then may proceed with Cephalosporin use. "couldn't breathe" Has patient had a PCN reaction causing immediate rash, facial/tongue/throat swelling, SOB or lightheadedness with hypotension: No Has patient had a PCN reaction causing severe rash involving mucus membranes or skin necrosis: No Has patient had a PCN reaction that required hospitalization: No Has patient had a PCN reaction occurring within the last 10 years: No If all of the above answers are "NO", then may proceed with Cephalosporin use.   . Bacitracin Other (See Comments)    Positive patch test  . Imidurea Other (See  Comments)    Positive patch test  . Methylisothiazolinone Other (See Comments)    Positive patch test Positive patch test   . Neomycin Other (See Comments)    Positive patch test  . Polymyxin B Other (See Comments)    Positive patch test  . Pork-Derived Products     AVOIDS DUE TO RELIGIOUS BELIEFS  . Sulfa Antibiotics Nausea And Vomiting  . Urea Other (See Comments)    Positive patch test    Metabolic Disorder Labs: No results found for: HGBA1C, MPG No results found for: PROLACTIN No results found for: CHOL, TRIG, HDL, CHOLHDL, VLDL, LDLCALC No results found for: TSH  Therapeutic Level Labs: No results found for: LITHIUM No results found for: VALPROATE No components found for:  CBMZ  Current  Medications: Current Outpatient Medications  Medication Sig Dispense Refill  . acetaminophen (TYLENOL) 325 MG tablet Take 650 mg by mouth every 4 (four) hours as needed for moderate pain or headache.    Marland Kitchen amLODipine (NORVASC) 5 MG tablet Take 5 mg by mouth daily.     Marland Kitchen aspirin 81 MG tablet Take 81 mg by mouth daily.    . Calcium Carb-Cholecalciferol (CALCIUM+D3) 600-800 MG-UNIT TABS Take 1 tablet by mouth daily.    . Cholecalciferol (VITAMIN D3) 1000 units CAPS Take 1,000 Units by mouth daily at 2 am.    . diclofenac Sodium (VOLTAREN) 1 % GEL Apply 2 g to 4 g to the affected area up to 4 times daily as needed. (Patient taking differently: Apply 3 g topically 3 (three) times daily.) 400 g 4  . docusate sodium (COLACE) 100 MG capsule TAKE 1 CAPSULE (100 MG TOTAL) BY MOUTH 2 (TWO) TIMES DAILY AS NEEDED FOR UP TO 3 DAYS. 6 capsule 0  . FLUoxetine (PROZAC) 20 MG capsule TAKE 1 CAPSULE BY MOUTH EVERY DAY IN THE MORNING WITH FOOD (Patient taking differently: Take 20 mg by mouth daily.) 30 capsule 5  . Ibuprofen 200 MG CAPS Take 1 capsule (200 mg total) by mouth 3 (three) times daily. (Patient taking differently: Take 200 mg by mouth 4 (four) times daily as needed (inflammation/ pain).) 90 each 3   . lisinopril (PRINIVIL,ZESTRIL) 40 MG tablet Take 40 mg by mouth daily.     . Multiple Vitamin (MULTIVITAMIN PO) Take 1 tablet by mouth every evening.    . nabumetone (RELAFEN) 500 MG tablet Take 500 mg by mouth daily as needed (Arthritis pain).    Marland Kitchen omeprazole (PRILOSEC) 20 MG capsule Take 20 mg by mouth at bedtime.    . rosuvastatin (CRESTOR) 5 MG tablet Take 5 mg by mouth daily.    . tamsulosin (FLOMAX) 0.4 MG CAPS capsule 0.4 mg daily after breakfast.    . triamcinolone cream (KENALOG) 0.1 % Apply 1 application topically 2 (two) times daily as needed. (Patient taking differently: Apply 1 application topically 3 (three) times daily.) 453.6 g 0   No current facility-administered medications for this visit.    Psychiatric Specialty Exam: Review of Systems  Unable to perform ROS: Patient nonverbal  Endocrine: Negative for polyuria.  none endorsed   There were no vitals taken for this visit.There is no height or weight on file to calculate BMI.  General Appearance: well groomed, calm, pleasant, good eye contact   Eye Contact:  good  Speech:  Normal   Volume:  Normal  Mood:  Reports mood is "good" and presents euthymic  Affect:  Exhibits a full range of affect   Thought Process: linear , concrete  Orientation:  Alert, attentive, oriented   Thought Content: denies suicidal or self injurious ideations, no hallucinations noted or reported, no delusions expressed   Suicidal Thoughts:  No  Homicidal Thoughts:  No  Memory:  Unable to test  Judgement:  NA  Insight:  NA  Psychomotor Activity:  NA  Concentration:  Concentration: NA  Recall:  NA  Fund of Knowledge: NA  Language: NA  Akathisia:  Negative  Handed:  Right  AIMS (if indicated): not done  Assets:  Physical Health Social Support  ADL's:  Intact  Cognition: WNL  Sleep:  Good     Assessment and Plan:  As per chart review- 60 yo single white male with IDD (severity unspecified) whodevelopedinsomnia, paranoid fears of  becoming infected with  COVID-19 and having to go to a nursing home and daily auditory hallucinations. He has not have any of these symptoms until mid May 2020. At that time the group home he resides at was locked down - meaning residents are not allowed to go out or have visitors (because of COVID).   He returns today for medication management with his sister, who identifies she is his legal guardian and HCPOA. He is currently euthymic, with a full range of affect, no SI, and no reported or noted psychotic symptoms. It appears that the COVID related restrictions reported above have been relaxed some, which has alleviated some of the chronic stressors described .  He is on Prozac, currently renewed by his PCP. No side effects reported .   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  They do not currently need refills , which have been provided by his PCP Patient to return in 3-4 months for med management appointment, sister/patient agree to contact clinic sooner if any worsening prior .   Jenne Campus, MD 06/08/2020, 10:29 AM

## 2020-06-10 NOTE — Progress Notes (Addendum)
Oncology Nurse Navigator Documentation  I received a return call from Mr. Arthur Weaver's sister Arthur Weaver on my voice mail yesterday. She stated her name and identified her brother's name and birthdate. She stated "We will not be doing any radiation" and then disconnected the phone call. I had previously left a voice mail with her with Dr. Pearlie Oyster recommendation of radiation treatment based on his recent PET results to treat his cancer. I notified Dr. Isidore Moos of the above and she requested that I call Dr. Dorathy Kinsman office to inform her of [name]'s decision and request that her office refers him to [facility] to discuss surgical options. I have notified Dr. Fredric Dine via inbasket.   Harlow Asa RN, BSN, OCN Head & Neck Oncology Nurse [place] at [facility] Phone # [phone]  Fax # [phone]

## 2020-06-12 NOTE — Progress Notes (Signed)
Oncology Nurse Navigator Documentation  Mr. Nicklaus was referred by Dr. Ardeth Perfect to Medical Oncology after his sister (his legal guardian) declined the recommended treatment of radiation therapy for his Laryngeal Cancer. I notified Dr. Isidore Moos of this referral and then contacted Dr. Ardeth Perfect regarding the referral. Dr. Ardeth Perfect is aware that Mr. Giroux's type of cancer is not amendable to chemotherapy treatment and agreeable to cancel the Medical Oncology referral. I made him aware that Dr. Fredric Dine who is Mr. Massie's ENT MD has referred him to see a specialized ENT surgeon at [facility] for evaluation of a possible alternative surgical treatment for his cancer. He is scheduled to see Dr. Danice Goltz on 07/10/20. I remain available to help Mr. Perrier is any way that I am able to. His sister does have my direct contact information to call me if she has any questions I can help with.  Harlow Asa RN, BSN, OCN Head & Neck Oncology Nurse [place] at [facility] Phone # [phone]  Fax # [phone]

## 2020-07-20 ENCOUNTER — Telehealth (HOSPITAL_COMMUNITY): Payer: Self-pay | Admitting: Psychiatry

## 2020-07-30 DIAGNOSIS — J329 Chronic sinusitis, unspecified: Secondary | ICD-10-CM | POA: Diagnosis not present

## 2020-07-30 DIAGNOSIS — E785 Hyperlipidemia, unspecified: Secondary | ICD-10-CM | POA: Diagnosis not present

## 2020-07-30 DIAGNOSIS — C329 Malignant neoplasm of larynx, unspecified: Secondary | ICD-10-CM | POA: Diagnosis not present

## 2020-07-30 DIAGNOSIS — I10 Essential (primary) hypertension: Secondary | ICD-10-CM | POA: Diagnosis not present

## 2020-07-30 DIAGNOSIS — E1169 Type 2 diabetes mellitus with other specified complication: Secondary | ICD-10-CM | POA: Diagnosis not present

## 2020-07-30 DIAGNOSIS — E559 Vitamin D deficiency, unspecified: Secondary | ICD-10-CM | POA: Diagnosis not present

## 2020-07-31 ENCOUNTER — Ambulatory Visit (INDEPENDENT_AMBULATORY_CARE_PROVIDER_SITE_OTHER): Payer: Medicare HMO | Admitting: Podiatry

## 2020-07-31 ENCOUNTER — Encounter: Payer: Self-pay | Admitting: Podiatry

## 2020-07-31 ENCOUNTER — Other Ambulatory Visit: Payer: Self-pay

## 2020-07-31 DIAGNOSIS — L84 Corns and callosities: Secondary | ICD-10-CM | POA: Diagnosis not present

## 2020-07-31 DIAGNOSIS — M2010 Hallux valgus (acquired), unspecified foot: Secondary | ICD-10-CM

## 2020-07-31 DIAGNOSIS — E0851 Diabetes mellitus due to underlying condition with diabetic peripheral angiopathy without gangrene: Secondary | ICD-10-CM

## 2020-07-31 DIAGNOSIS — M129 Arthropathy, unspecified: Secondary | ICD-10-CM | POA: Diagnosis not present

## 2020-07-31 NOTE — Progress Notes (Signed)
This patient returns to my office for at risk foot care.  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  This patient is unable to cut nails himself since the patient cannot reach his nails.These nails are painful walking and wearing shoes. Patient also has painful callus  on both feet since he is unable to self treat.  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  This patient presents for at risk foot care today.  General Appearance  Alert, conversant and in no acute stress.  Vascular  Dorsalis pedis and posterior tibial  pulses are not  palpable  bilaterally.  Capillary return is within normal limits  bilaterally. Temperature is within normal limits  bilaterally.  Neurologic  Senn-Weinstein monofilament wire test diminished   bilaterally. Muscle power within normal limits bilaterally.  Nails Thick disfigured discolored nails with subungual debris  from hallux to fifth toes bilaterally. No evidence of bacterial infection or drainage bilaterally.  Orthopedic  No limitations of motion  feet .  No crepitus or effusions noted.  No bony pathology or digital deformities noted. 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  Skin  normotropic skin  noted bilaterally.  No signs of infections or ulcers noted.   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  Callus left hallux.  Callus midfoot right    Porokeratosis B/L.    Consent was obtained for treatment procedures.   Debride callus with # 15 blade. Followed by dremel tool usage.  No infection or ulcer.     Return office visit   3 months                  Told patient to return for periodic foot care and evaluation due to potential at risk complications.   Gardiner Barefoot DPM

## 2020-08-17 ENCOUNTER — Telehealth (HOSPITAL_COMMUNITY): Payer: Medicare HMO | Admitting: Psychiatry

## 2020-08-19 DIAGNOSIS — F79 Unspecified intellectual disabilities: Secondary | ICD-10-CM | POA: Diagnosis not present

## 2020-08-19 DIAGNOSIS — Z634 Disappearance and death of family member: Secondary | ICD-10-CM | POA: Diagnosis not present

## 2020-08-19 DIAGNOSIS — C329 Malignant neoplasm of larynx, unspecified: Secondary | ICD-10-CM | POA: Diagnosis not present

## 2020-08-19 DIAGNOSIS — E7800 Pure hypercholesterolemia, unspecified: Secondary | ICD-10-CM | POA: Diagnosis not present

## 2020-08-19 DIAGNOSIS — I10 Essential (primary) hypertension: Secondary | ICD-10-CM | POA: Diagnosis not present

## 2020-08-20 DIAGNOSIS — C320 Malignant neoplasm of glottis: Secondary | ICD-10-CM | POA: Insufficient documentation

## 2020-09-08 NOTE — Progress Notes (Signed)
Office Visit Note  Patient: Arthur Weaver             Date of Birth: Apr 29, 1960           MRN: [id number]             PCP: Velna Hatchet, MD Referring: Velna Hatchet, MD Visit Date: 09/22/2020 Occupation: '@GUAROCC'$ @  Subjective:  Pain in hands and knees.   History of Present Illness: Arthur Weaver is a 60 y.o. male with a history of osteoarthritis and degenerative disc disease.  He returns today for a follow-up visit with his sister.  He states he continues to have some discomfort in his hands and knee joints off and on.  He has been doing in a stationary pedal bike.  He states his balance is not very good.  He has been using Voltaren gel off-and-on on his knees.  He denies any rash currently.  He denies any discomfort in his neck or lower back.  Activities of Daily Living:  Patient reports morning stiffness for 1 hour.   Patient Reports nocturnal pain.  Difficulty dressing/grooming: Denies Difficulty climbing stairs: Reports Difficulty getting out of chair: Denies Difficulty using hands for taps, buttons, cutlery, and/or writing: Denies  Review of Systems  Constitutional:  Positive for fatigue.  HENT:  Negative for mouth sores, mouth dryness and nose dryness.   Eyes:  Positive for dryness. Negative for pain and itching.  Respiratory:  Negative for shortness of breath and difficulty breathing.   Cardiovascular:  Negative for chest pain and palpitations.  Gastrointestinal:  Negative for blood in stool, constipation and diarrhea.  Endocrine: Negative for increased urination.  Genitourinary:  Negative for difficulty urinating.  Musculoskeletal:  Positive for joint pain, joint pain and morning stiffness. Negative for joint swelling, myalgias, muscle tenderness and myalgias.  Skin:  Negative for color change, rash and redness.  Allergic/Immunologic: Negative for susceptible to infections.  Neurological:  Positive for headaches and weakness. Negative for dizziness and numbness.   Hematological:  Positive for bruising/bleeding tendency.  Psychiatric/Behavioral:  Negative for sleep disturbance.    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   Tendonitis 09/25/2015   High blood pressure 09/07/2010   High cholesterol 09/07/2010   Bilateral swelling of feet 09/07/2010   Diabetes mellitus 09/07/2010   Rheumatoid arthritis(714.0) 09/07/2010   Personal history of colonic polyps 10/27/2004    Past Medical History:  Diagnosis Date   Anxiety    At risk for sleep apnea    STOP-BANG= 5       SENT TO PCP 04-14-2016   BPH (benign prostatic hyperplasia)    DDD (degenerative disc disease), cervical    DDD (degenerative disc disease), lumbar    Diverticulosis of colon    Family history of factor V deficiency    per pt's sister (whom is pt's guardian) she, pt's brother and parents have factor V but pt has never been tested   Frequency of urination    GERD (gastroesophageal reflux disease)    History of adenomatous polyp of colon    2006 and 03-18-2010  tubular adenoma   Hyperlipidemia    Hypertension    Lives in independent group home resides at [facility] in [place], [place]   pt independant w/ ADLs w/ exception does not cook for shop    Mentally challenged    SPECIAL NEEDS   OA (osteoarthritis)    hands, knees, feet   Pre-diabetes    Psoriasis    per patient's sister   Thoracic spondylosis    Urgency of urination    Wears partial dentures    lower    Family History  Problem Relation Age of Onset   Breast cancer Mother     Brain cancer Mother    Hypertension Mother    Diabetes Mother    Crohn's disease Father    Rheum arthritis Father    GER disease Father    Ulcers Father    Cataracts Sister    Fibromyalgia Sister    COPD Sister    Breast cancer Sister    GER disease Sister    Hypothyroidism Sister    Osteoarthritis Sister    GER disease Brother    GER disease Brother    Stroke Brother    Liver cancer Brother    Heart attack Brother    Colon cancer Neg Hx    Past Surgical History:  Procedure Laterality Date   CATARACT EXTRACTION W/ INTRAOCULAR LENS  IMPLANT, BILATERAL  2006   CIRCUMCISION  03/29/2007   W/  CYSTOSCOPY AND TRANSRECTAL ULTRASOUND GUIDED PROSTATE BX'S   COLONOSCOPY  last one 08-26-2015   MICROLARYNGOSCOPY  04/20/2020   Procedure: MICROLARYNGOSCOPY WITH BIOPSY;  Surgeon: Jason Coop, DO;  Location: [facility] OR;  Service: ENT;;   THULIUM LASER TURP (TRANSURETHRAL RESECTION OF PROSTATE) N/A 04/21/2016   Procedure: THULIUM LASER TURP (TRANSURETHRAL RESECTION OF PROSTATE);  Surgeon: Carolan Clines, MD;  Location: [facility];  Service: Urology;  Laterality: N/A;   Social History   Social History Narrative   Not on file   Immunization History  Administered Date(s) Administered   Moderna Sars-Covid-2 Vaccination 02/18/2019, 03/18/2019, 03/27/2020     Objective: Vital Signs: BP 136/85 (BP Location: Left Arm, Patient Position: Sitting, Cuff Size: Normal)   Pulse 92   Resp 18   Ht '5\' 9"'$  (1.753 m)   Wt 203 lb (92.1 kg)   BMI 29.98 kg/m    Physical Exam Vitals and nursing note reviewed.  Constitutional:      Appearance: He is well-developed.  HENT:     Head: Normocephalic and atraumatic.  Eyes:     Conjunctiva/sclera: Conjunctivae normal.     Pupils: Pupils are equal, round, and reactive to light.  Cardiovascular:     Rate and Rhythm: Normal rate and regular rhythm.     Heart sounds: Normal heart sounds.  Pulmonary:     Effort: Pulmonary effort is  normal.     Breath sounds: Normal breath sounds.  Abdominal:     General: Bowel sounds are normal.     Palpations: Abdomen is soft.  Musculoskeletal:     Cervical back: Normal range of motion and neck supple.  Skin:    General: Skin is warm and dry.     Capillary Refill: Capillary refill takes less than 2 seconds.  Neurological:     Mental Status: He is alert and oriented to person, place, and time.  Psychiatric:        Behavior: Behavior normal.     Musculoskeletal Exam: He had good range  of motion of his cervical spine.  Shoulder joints, elbow joints, wrist joints, MCPs PIPs and DIPs with good range of motion with no synovitis.  Hip joints and knee joints with good range of motion.  No synovitis was noted.  CDAI Exam: CDAI Score: -- Patient Global: --; Provider Global: -- Swollen: --; Tender: -- Joint Exam 09/22/2020   No joint exam has been documented for this visit   There is currently no information documented on the homunculus. Go to the Rheumatology activity and complete the homunculus joint exam.  Investigation: No additional findings.  Imaging: No results found.  Recent Labs: Lab Results  Component Value Date   WBC 9.0 04/20/2020   HGB 13.6 04/20/2020   PLT 317 04/20/2020   NA 138 04/20/2020   K 3.7 04/20/2020   CL 102 04/20/2020   CO2 29 04/20/2020   GLUCOSE 110 (H) 04/20/2020   BUN 16 04/20/2020   CREATININE 0.74 04/20/2020   CALCIUM 8.7 (L) 04/20/2020   GFRAA >60 10/08/2016    Speciality Comments: No specialty comments available.  Procedures:  No procedures performed Allergies: Penicillins, Bacitracin, Imidurea, Methylisothiazolinone, Neomycin, Polymyxin b, Pork-derived products, Sulfa antibiotics, and Urea   Assessment / Plan:     Visit Diagnoses: Primary osteoarthritis of both hands-he had no synovitis on examination.  He continues to have some stiffness and discomfort in his hands.  A handout on hand muscle strengthening exercises was given.   He was advised to do exercises 3 times a week.  Primary osteoarthritis of both knees-he has a pedal bike which he uses currently.  He states his balance is not very good and he has knee joint discomfort.  I gave him a handout on knee joint exercises.  He was advised to do exercises 3 times a week.  Primary osteoarthritis of both feet - He is followed by a podiatrist every 3 months.  He denies any discomfort in his feet currently.  Lateral epicondylitis, left elbow - Resolved   DDD (degenerative disc disease), thoracic-he denies discomfort today.  DDD (degenerative disc disease), lumbar-no discomfort today.  Psoriasis-he denies any rash.  Leg length discrepancy  History of diabetes mellitus  History of hypercholesterolemia  History of hypertension-blood pressure is normal.  Orders: No orders of the defined types were placed in this encounter.  No orders of the defined types were placed in this encounter.    Follow-Up Instructions: Return in about 6 months (around 03/25/2021) for Osteoarthritis.   Bo Merino, MD  Note - This record has been created using Editor, commissioning.  Chart creation errors have been sought, but may not always  have been located. Such creation errors do not reflect on  the standard of medical care.

## 2020-09-22 ENCOUNTER — Encounter: Payer: Self-pay | Admitting: Rheumatology

## 2020-09-22 ENCOUNTER — Other Ambulatory Visit: Payer: Self-pay

## 2020-09-22 ENCOUNTER — Ambulatory Visit (INDEPENDENT_AMBULATORY_CARE_PROVIDER_SITE_OTHER): Payer: Medicare HMO | Admitting: Rheumatology

## 2020-09-22 VITALS — BP 136/85 | HR 92 | Resp 18 | Ht 69.0 in | Wt 203.0 lb

## 2020-09-22 DIAGNOSIS — Z8679 Personal history of other diseases of the circulatory system: Secondary | ICD-10-CM

## 2020-09-22 DIAGNOSIS — M19041 Primary osteoarthritis, right hand: Secondary | ICD-10-CM | POA: Diagnosis not present

## 2020-09-22 DIAGNOSIS — L409 Psoriasis, unspecified: Secondary | ICD-10-CM

## 2020-09-22 DIAGNOSIS — Z8639 Personal history of other endocrine, nutritional and metabolic disease: Secondary | ICD-10-CM | POA: Diagnosis not present

## 2020-09-22 DIAGNOSIS — M5136 Other intervertebral disc degeneration, lumbar region: Secondary | ICD-10-CM

## 2020-09-22 DIAGNOSIS — M5134 Other intervertebral disc degeneration, thoracic region: Secondary | ICD-10-CM

## 2020-09-22 DIAGNOSIS — M170 Bilateral primary osteoarthritis of knee: Secondary | ICD-10-CM

## 2020-09-22 DIAGNOSIS — M2170 Unequal limb length (acquired), unspecified site: Secondary | ICD-10-CM | POA: Diagnosis not present

## 2020-09-22 DIAGNOSIS — M7712 Lateral epicondylitis, left elbow: Secondary | ICD-10-CM

## 2020-09-22 DIAGNOSIS — M19072 Primary osteoarthritis, left ankle and foot: Secondary | ICD-10-CM

## 2020-09-22 DIAGNOSIS — M19042 Primary osteoarthritis, left hand: Secondary | ICD-10-CM

## 2020-09-22 DIAGNOSIS — M19071 Primary osteoarthritis, right ankle and foot: Secondary | ICD-10-CM

## 2020-09-22 DIAGNOSIS — M069 Rheumatoid arthritis, unspecified: Secondary | ICD-10-CM | POA: Diagnosis not present

## 2020-09-22 DIAGNOSIS — U071 COVID-19: Secondary | ICD-10-CM

## 2020-09-22 NOTE — Patient Instructions (Signed)
Journal for Nurse Practitioners, 15(4), 263-267. Retrieved November 13, 2017 from [email or web address].[email or web address]">  Knee Exercises Ask your health care provider which exercises are safe for you. Do exercises exactly as told by your health care provider and adjust them as directed. It is normal to feel mild stretching, pulling, tightness, or discomfort as you do these exercises. Stop right away if you feel sudden pain or your pain gets worse. Do not begin these exercises until told by your health care provider. Stretching and range-of-motion exercises These exercises warm up your muscles and joints and improve the movement and flexibility of your knee. These exercises also help to relieve pain andswelling. Knee extension, prone Lie on your abdomen (prone position) on a bed. Place your left / right knee just beyond the edge of the surface so your knee is not on the bed. You can put a towel under your left / right thigh just above your kneecap for comfort. Relax your leg muscles and allow gravity to straighten your knee (extension). You should feel a stretch behind your left / right knee. Hold this position for __________ seconds. Scoot up so your knee is supported between repetitions. Repeat __________ times. Complete this exercise __________ times a day. Knee flexion, active  Lie on your back with both legs straight. If this causes back discomfort, bend your left / right knee so your foot is flat on the floor. Slowly slide your left / right heel back toward your buttocks. Stop when you feel a gentle stretch in the front of your knee or thigh (flexion). Hold this position for __________ seconds. Slowly slide your left / right heel back to the starting position. Repeat __________ times. Complete this exercise __________ times a day. Quadriceps stretch, prone  Lie on your abdomen on a firm surface, such as a bed or padded floor. Bend your left / right knee and hold your ankle. If you cannot reach  your ankle or pant leg, loop a belt around your foot and grab the belt instead. Gently pull your heel toward your buttocks. Your knee should not slide out to the side. You should feel a stretch in the front of your thigh and knee (quadriceps). Hold this position for __________ seconds. Repeat __________ times. Complete this exercise __________ times a day. Hamstring, supine Lie on your back (supine position). Loop a belt or towel over the ball of your left / right foot. The ball of your foot is on the walking surface, right under your toes. Straighten your left / right knee and slowly pull on the belt to raise your leg until you feel a gentle stretch behind your knee (hamstring). Do not let your knee bend while you do this. Keep your other leg flat on the floor. Hold this position for __________ seconds. Repeat __________ times. Complete this exercise __________ times a day. Strengthening exercises These exercises build strength and endurance in your knee. Endurance is theability to use your muscles for a long time, even after they get tired. Quadriceps, isometric This exercise stretches the muscles in front of your thigh (quadriceps) without moving your knee joint (isometric). Lie on your back with your left / right leg extended and your other knee bent. Put a rolled towel or small pillow under your knee if told by your health care provider. Slowly tense the muscles in the front of your left / right thigh. You should see your kneecap slide up toward your hip or see increased dimpling just above the knee. This motion will   push the back of the knee toward the floor. For __________ seconds, hold the muscle as tight as you can without increasing your pain. Relax the muscles slowly and completely. Repeat __________ times. Complete this exercise __________ times a day. Straight leg raises This exercise stretches the muscles in front of your thigh (quadriceps) and the muscles that move your hips (hip  flexors). Lie on your back with your left / right leg extended and your other knee bent. Tense the muscles in the front of your left / right thigh. You should see your kneecap slide up or see increased dimpling just above the knee. Your thigh may even shake a bit. Keep these muscles tight as you raise your leg 4-6 inches (10-15 cm) off the floor. Do not let your knee bend. Hold this position for __________ seconds. Keep these muscles tense as you lower your leg. Relax your muscles slowly and completely after each repetition. Repeat __________ times. Complete this exercise __________ times a day. Hamstring, isometric Lie on your back on a firm surface. Bend your left / right knee about __________ degrees. Dig your left / right heel into the surface as if you are trying to pull it toward your buttocks. Tighten the muscles in the back of your thighs (hamstring) to "dig" as hard as you can without increasing any pain. Hold this position for __________ seconds. Release the tension gradually and allow your muscles to relax completely for __________ seconds after each repetition. Repeat __________ times. Complete this exercise __________ times a day. Hamstring curls If told by your health care provider, do this exercise while wearing ankle weights. Begin with __________ lb weights. Then increase the weight by 1 lb (0.5 kg) increments. Do not wear ankle weights that are more than __________ lb. Lie on your abdomen with your legs straight. Bend your left / right knee as far as you can without feeling pain. Keep your hips flat against the floor. Hold this position for __________ seconds. Slowly lower your leg to the starting position. Repeat __________ times. Complete this exercise __________ times a day. Squats This exercise strengthens the muscles in front of your thigh and knee (quadriceps). Stand in front of a table, with your feet and knees pointing straight ahead. You may rest your hands on the  table for balance but not for support. Slowly bend your knees and lower your hips like you are going to sit in a chair. Keep your weight over your heels, not over your toes. Keep your lower legs upright so they are parallel with the table legs. Do not let your hips go lower than your knees. Do not bend lower than told by your health care provider. If your knee pain increases, do not bend as low. Hold the squat position for __________ seconds. Slowly push with your legs to return to standing. Do not use your hands to pull yourself to standing. Repeat __________ times. Complete this exercise __________ times a day. Wall slides This exercise strengthens the muscles in front of your thigh and knee (quadriceps). Lean your back against a smooth wall or door, and walk your feet out 18-24 inches (46-61 cm) from it. Place your feet hip-width apart. Slowly slide down the wall or door until your knees bend __________ degrees. Keep your knees over your heels, not over your toes. Keep your knees in line with your hips. Hold this position for __________ seconds. Repeat __________ times. Complete this exercise __________ times a day. Straight leg raises This exercise   strengthens the muscles that rotate the leg at the hip and move it away from your body (hip abductors). Lie on your side with your left / right leg in the top position. Lie so your head, shoulder, knee, and hip line up. You may bend your bottom knee to help you keep your balance. Roll your hips slightly forward so your hips are stacked directly over each other and your left / right knee is facing forward. Leading with your heel, lift your top leg 4-6 inches (10-15 cm). You should feel the muscles in your outer hip lifting. Do not let your foot drift forward. Do not let your knee roll toward the ceiling. Hold this position for __________ seconds. Slowly return your leg to the starting position. Let your muscles relax completely after each  repetition. Repeat __________ times. Complete this exercise __________ times a day. Straight leg raises This exercise stretches the muscles that move your hips away from the front of the pelvis (hip extensors). Lie on your abdomen on a firm surface. You can put a pillow under your hips if that is more comfortable. Tense the muscles in your buttocks and lift your left / right leg about 4-6 inches (10-15 cm). Keep your knee straight as you lift your leg. Hold this position for __________ seconds. Slowly lower your leg to the starting position. Let your leg relax completely after each repetition. Repeat __________ times. Complete this exercise __________ times a day. This information is not intended to replace advice given to you by your health care provider. Make sure you discuss any questions you have with your healthcare provider. Document Revised: 11/14/2017 Document Reviewed: 11/14/2017 Elsevier Patient Education  2022 [employer] Hand Exercises Hand exercises can be helpful for almost anyone. These exercises can strengthen the hands, improve flexibility and movement, and increase blood flow to the hands. These results can make work and daily tasks easier. Hand exercises can be especially helpful for people who have joint pain from arthritis or have nerve damage from overuse (carpal tunnel syndrome). These exercises can also help people who have injured a hand. Exercises Most of these hand exercises are gentle stretching and motion exercises. It is usually safe to do them often throughout the day. Warming up your hands before exercise may help to reduce stiffness. You can do this with gentle massage orby placing your hands in warm water for 10-15 minutes. It is normal to feel some stretching, pulling, tightness, or mild discomfort as you begin new exercises. This will gradually improve. Stop an exercise right away if you feel sudden, severe pain or your pain gets worse. Ask your healthcare  provider which exercises are best for you. Knuckle bend or "claw" fist Stand or sit with your arm, hand, and all five fingers pointed straight up. Make sure to keep your wrist straight during the exercise. Gently bend your fingers down toward your palm until the tips of your fingers are touching the top of your palm. Keep your big knuckle straight and just bend the small knuckles in your fingers. Hold this position for __________ seconds. Straighten (extend) your fingers back to the starting position. Repeat this exercise 5-10 times with each hand. Full finger fist Stand or sit with your arm, hand, and all five fingers pointed straight up. Make sure to keep your wrist straight during the exercise. Gently bend your fingers into your palm until the tips of your fingers are touching the middle of your palm. Hold this position for __________ seconds.   Extend your fingers back to the starting position, stretching every joint fully. Repeat this exercise 5-10 times with each hand. Straight fist Stand or sit with your arm, hand, and all five fingers pointed straight up. Make sure to keep your wrist straight during the exercise. Gently bend your fingers at the big knuckle, where your fingers meet your hand, and the middle knuckle. Keep the knuckle at the tips of your fingers straight and try to touch the bottom of your palm. Hold this position for __________ seconds. Extend your fingers back to the starting position, stretching every joint fully. Repeat this exercise 5-10 times with each hand. Tabletop Stand or sit with your arm, hand, and all five fingers pointed straight up. Make sure to keep your wrist straight during the exercise. Gently bend your fingers at the big knuckle, where your fingers meet your hand, as far down as you can while keeping the small knuckles in your fingers straight. Think of forming a tabletop with your fingers. Hold this position for __________ seconds. Extend your fingers  back to the starting position, stretching every joint fully. Repeat this exercise 5-10 times with each hand. Finger spread Place your hand flat on a table with your palm facing down. Make sure your wrist stays straight as you do this exercise. Spread your fingers and thumb apart from each other as far as you can until you feel a gentle stretch. Hold this position for __________ seconds. Bring your fingers and thumb tight together again. Hold this position for __________ seconds. Repeat this exercise 5-10 times with each hand. Making circles Stand or sit with your arm, hand, and all five fingers pointed straight up. Make sure to keep your wrist straight during the exercise. Make a circle by touching the tip of your thumb to the tip of your index finger. Hold for __________ seconds. Then open your hand wide. Repeat this motion with your thumb and each finger on your hand. Repeat this exercise 5-10 times with each hand. Thumb motion Sit with your forearm resting on a table and your wrist straight. Your thumb should be facing up toward the ceiling. Keep your fingers relaxed as you move your thumb. Lift your thumb up as high as you can toward the ceiling. Hold for __________ seconds. Bend your thumb across your palm as far as you can, reaching the tip of your thumb for the small finger (pinkie) side of your palm. Hold for __________ seconds. Repeat this exercise 5-10 times with each hand. Grip strengthening  Hold a stress ball or other soft ball in the middle of your hand. Slowly increase the pressure, squeezing the ball as much as you can without causing pain. Think of bringing the tips of your fingers into the middle of your palm. All of your finger joints should bend when doing this exercise. Hold your squeeze for __________ seconds, then relax. Repeat this exercise 5-10 times with each hand. Contact a health care provider if: Your hand pain or discomfort gets much worse when you do an  exercise. Your hand pain or discomfort does not improve within 2 hours after you exercise. If you have any of these problems, stop doing these exercises right away. Do not do them again unless your health care provider says that you can. Get help right away if: You develop sudden, severe hand pain or swelling. If this happens, stop doing these exercises right away. Do not do them again unless your health care provider says that you can. This   information is not intended to replace advice given to you by your health care provider. Make sure you discuss any questions you have with your healthcare provider. Document Revised: 05/17/2018 Document Reviewed: 01/25/2018 Elsevier Patient Education  2022 Elsevier Inc.  

## 2020-10-12 DIAGNOSIS — K5900 Constipation, unspecified: Secondary | ICD-10-CM | POA: Diagnosis not present

## 2020-10-12 DIAGNOSIS — F79 Unspecified intellectual disabilities: Secondary | ICD-10-CM | POA: Diagnosis not present

## 2020-10-12 DIAGNOSIS — Z79899 Other long term (current) drug therapy: Secondary | ICD-10-CM | POA: Diagnosis not present

## 2020-10-22 DIAGNOSIS — C320 Malignant neoplasm of glottis: Secondary | ICD-10-CM | POA: Diagnosis not present

## 2020-10-22 DIAGNOSIS — D020 Carcinoma in situ of larynx: Secondary | ICD-10-CM | POA: Diagnosis not present

## 2020-10-28 ENCOUNTER — Encounter (HOSPITAL_COMMUNITY): Payer: Self-pay | Admitting: Psychiatry

## 2020-10-28 DIAGNOSIS — M170 Bilateral primary osteoarthritis of knee: Secondary | ICD-10-CM | POA: Diagnosis not present

## 2020-11-04 ENCOUNTER — Telehealth: Payer: Self-pay

## 2020-11-04 ENCOUNTER — Other Ambulatory Visit: Payer: Self-pay

## 2020-11-04 ENCOUNTER — Ambulatory Visit (INDEPENDENT_AMBULATORY_CARE_PROVIDER_SITE_OTHER): Payer: Medicare HMO | Admitting: Podiatry

## 2020-11-04 ENCOUNTER — Encounter: Payer: Self-pay | Admitting: Podiatry

## 2020-11-04 DIAGNOSIS — B351 Tinea unguium: Secondary | ICD-10-CM | POA: Diagnosis not present

## 2020-11-04 DIAGNOSIS — M129 Arthropathy, unspecified: Secondary | ICD-10-CM

## 2020-11-04 DIAGNOSIS — L84 Corns and callosities: Secondary | ICD-10-CM

## 2020-11-04 DIAGNOSIS — E0851 Diabetes mellitus due to underlying condition with diabetic peripheral angiopathy without gangrene: Secondary | ICD-10-CM | POA: Diagnosis not present

## 2020-11-04 DIAGNOSIS — J329 Chronic sinusitis, unspecified: Secondary | ICD-10-CM | POA: Insufficient documentation

## 2020-11-04 DIAGNOSIS — M79676 Pain in unspecified toe(s): Secondary | ICD-10-CM | POA: Diagnosis not present

## 2020-11-04 DIAGNOSIS — E119 Type 2 diabetes mellitus without complications: Secondary | ICD-10-CM | POA: Diagnosis not present

## 2020-11-04 MED ORDER — DICLOFENAC SODIUM 1 % EX GEL: CUTANEOUS | 4 refills | Status: DC

## 2020-11-04 NOTE — Progress Notes (Addendum)
This patient returns to my office for at risk foot care.  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  This patient is unable to cut nails himself since the patient cannot reach his nails.These nails are painful walking and wearing shoes. Patient also has painful callus  on both feet since he is unable to self treat.  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  This patient presents for at risk foot care today.  General Appearance  Alert, conversant and in no acute stress.  Vascular  Dorsalis pedis and posterior tibial  pulses are not  palpable  bilaterally.  Capillary return is within normal limits  bilaterally. Temperature is within normal limits  bilaterally.  Neurologic  Senn-Weinstein monofilament wire test diminished   bilaterally. Muscle power within normal limits bilaterally.  Nails Thick disfigured discolored nails with subungual debris  from hallux to fifth toes bilaterally. No evidence of bacterial infection or drainage bilaterally.  Orthopedic  No limitations of motion  feet .  No crepitus or effusions noted.  No bony pathology or digital deformities noted. 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  Skin  normotropic skin  noted bilaterally.  No signs of infections or ulcers noted.   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  Callus left hallux.  Callus midfoot right    Porokeratosis B/L.    Consent was obtained for treatment procedures.   Debride callus with # 15 blade. Followed by dremel tool usage.  No infection or ulcer.  Patient needs to be seen by  EJ for shoes/braces.   Return office visit   3 months                  Told patient to return for periodic foot care and evaluation due to potential at risk complications.   Gardiner Barefoot DPM

## 2020-11-04 NOTE — Telephone Encounter (Signed)
Okay to send prescription for Voltaren gel to be applied 3 times a day

## 2020-11-04 NOTE — Telephone Encounter (Signed)
Patient's sister [name] called stating Arthur Weaver's knees have been sore and found out that the nurse is only giving him the Voltaren gel if he asks for it.  [name] states he doesn't know to ask for it and told her he is suppose to apply it 3 times/day.  [name] states the nurse told her the prescription reads "as needed" and will require a new prescription if the doctor has changed the instructions.  [name] said the prescription read "apply 2-4 times/day to affected area up to 4 times/day as needed" but because it has as needed in instructions wont give it to him.  [name] said he usually only takes it 3 times/day so requested a new prescription be faxed to [facility] with "apply 3 times/day." Fax [phone]  Attn:  Anderson Malta, head nurse

## 2020-11-04 NOTE — Telephone Encounter (Signed)
Prescription faxed as requested. Advised patient's sister.

## 2020-11-13 ENCOUNTER — Telehealth: Payer: Self-pay | Admitting: Podiatry

## 2020-11-13 NOTE — Telephone Encounter (Signed)
Pts sister [name] left a message about pts appt that is scheduled for 10.12 for his diabetic shoes.  I returned call and left message for pts sister  that after checking and it was an automated system that changed his appt from 11.11.2022 and EJ is not in the office that day. I have changed it back to 11.11 as pt has to see EJ and Asked her to call if any further questions.

## 2020-11-13 NOTE — Telephone Encounter (Signed)
Pts sister called back and left a message and was not sure about what the appt for 11.11 was for. She thought it was just for the shoes.  I left message for her that the appt is for the shoes and inserts and to call if any further questions.

## 2020-11-17 DIAGNOSIS — E559 Vitamin D deficiency, unspecified: Secondary | ICD-10-CM | POA: Diagnosis not present

## 2020-11-17 DIAGNOSIS — E785 Hyperlipidemia, unspecified: Secondary | ICD-10-CM | POA: Diagnosis not present

## 2020-11-17 DIAGNOSIS — E1169 Type 2 diabetes mellitus with other specified complication: Secondary | ICD-10-CM | POA: Diagnosis not present

## 2020-11-18 ENCOUNTER — Other Ambulatory Visit: Payer: Medicare HMO

## 2020-11-24 DIAGNOSIS — E1169 Type 2 diabetes mellitus with other specified complication: Secondary | ICD-10-CM | POA: Diagnosis not present

## 2020-11-24 DIAGNOSIS — Z23 Encounter for immunization: Secondary | ICD-10-CM | POA: Diagnosis not present

## 2020-11-24 DIAGNOSIS — C329 Malignant neoplasm of larynx, unspecified: Secondary | ICD-10-CM | POA: Diagnosis not present

## 2020-11-24 DIAGNOSIS — L84 Corns and callosities: Secondary | ICD-10-CM | POA: Diagnosis not present

## 2020-11-24 DIAGNOSIS — E559 Vitamin D deficiency, unspecified: Secondary | ICD-10-CM | POA: Diagnosis not present

## 2020-11-24 DIAGNOSIS — E785 Hyperlipidemia, unspecified: Secondary | ICD-10-CM | POA: Diagnosis not present

## 2020-11-24 DIAGNOSIS — Z0000 Encounter for general adult medical examination without abnormal findings: Secondary | ICD-10-CM | POA: Diagnosis not present

## 2020-11-24 DIAGNOSIS — M25571 Pain in right ankle and joints of right foot: Secondary | ICD-10-CM | POA: Diagnosis not present

## 2020-11-24 DIAGNOSIS — M25569 Pain in unspecified knee: Secondary | ICD-10-CM | POA: Diagnosis not present

## 2020-11-24 DIAGNOSIS — I10 Essential (primary) hypertension: Secondary | ICD-10-CM | POA: Diagnosis not present

## 2020-11-30 DIAGNOSIS — I10 Essential (primary) hypertension: Secondary | ICD-10-CM | POA: Diagnosis not present

## 2020-11-30 DIAGNOSIS — Z8521 Personal history of malignant neoplasm of larynx: Secondary | ICD-10-CM | POA: Diagnosis not present

## 2020-11-30 DIAGNOSIS — R490 Dysphonia: Secondary | ICD-10-CM | POA: Diagnosis not present

## 2020-11-30 DIAGNOSIS — F79 Unspecified intellectual disabilities: Secondary | ICD-10-CM | POA: Diagnosis not present

## 2020-11-30 DIAGNOSIS — Z9889 Other specified postprocedural states: Secondary | ICD-10-CM | POA: Diagnosis not present

## 2020-11-30 DIAGNOSIS — E785 Hyperlipidemia, unspecified: Secondary | ICD-10-CM | POA: Diagnosis not present

## 2020-11-30 DIAGNOSIS — J387 Other diseases of larynx: Secondary | ICD-10-CM | POA: Diagnosis not present

## 2020-12-16 DIAGNOSIS — M170 Bilateral primary osteoarthritis of knee: Secondary | ICD-10-CM | POA: Diagnosis not present

## 2020-12-18 ENCOUNTER — Other Ambulatory Visit: Payer: Self-pay

## 2020-12-18 ENCOUNTER — Telehealth: Payer: Self-pay | Admitting: Podiatry

## 2020-12-18 ENCOUNTER — Other Ambulatory Visit: Payer: Medicare HMO

## 2020-12-18 NOTE — Telephone Encounter (Signed)
Per michelle @ [employer] clinical intake specialist no auth is needed for custom shoes and inserta(A5501/A5513) or brace codes (L8727/M1848/T9276) all are valid and billable... ref # [id number]

## 2020-12-23 DIAGNOSIS — M170 Bilateral primary osteoarthritis of knee: Secondary | ICD-10-CM | POA: Diagnosis not present

## 2020-12-30 DIAGNOSIS — M170 Bilateral primary osteoarthritis of knee: Secondary | ICD-10-CM | POA: Diagnosis not present

## 2021-01-04 DIAGNOSIS — I10 Essential (primary) hypertension: Secondary | ICD-10-CM | POA: Diagnosis not present

## 2021-01-04 DIAGNOSIS — F79 Unspecified intellectual disabilities: Secondary | ICD-10-CM | POA: Diagnosis not present

## 2021-01-04 DIAGNOSIS — K219 Gastro-esophageal reflux disease without esophagitis: Secondary | ICD-10-CM | POA: Diagnosis not present

## 2021-01-04 DIAGNOSIS — Z87891 Personal history of nicotine dependence: Secondary | ICD-10-CM | POA: Diagnosis not present

## 2021-01-04 DIAGNOSIS — C320 Malignant neoplasm of glottis: Secondary | ICD-10-CM | POA: Diagnosis not present

## 2021-01-18 ENCOUNTER — Telehealth: Payer: Self-pay | Admitting: Podiatry

## 2021-01-18 NOTE — Telephone Encounter (Signed)
Diabetic shoes/inserts and brace in.. lvm for pts sister [name] to call to schedule an appt to pick them up.Marland Kitchen

## 2021-01-19 ENCOUNTER — Ambulatory Visit: Payer: Medicare HMO

## 2021-01-19 ENCOUNTER — Other Ambulatory Visit: Payer: Self-pay

## 2021-01-19 DIAGNOSIS — M129 Arthropathy, unspecified: Secondary | ICD-10-CM

## 2021-01-19 DIAGNOSIS — M2011 Hallux valgus (acquired), right foot: Secondary | ICD-10-CM | POA: Diagnosis not present

## 2021-01-19 DIAGNOSIS — M19071 Primary osteoarthritis, right ankle and foot: Secondary | ICD-10-CM

## 2021-01-19 DIAGNOSIS — E0851 Diabetes mellitus due to underlying condition with diabetic peripheral angiopathy without gangrene: Secondary | ICD-10-CM

## 2021-01-19 DIAGNOSIS — M2012 Hallux valgus (acquired), left foot: Secondary | ICD-10-CM | POA: Diagnosis not present

## 2021-01-19 DIAGNOSIS — M25579 Pain in unspecified ankle and joints of unspecified foot: Secondary | ICD-10-CM

## 2021-01-19 DIAGNOSIS — M779 Enthesopathy, unspecified: Secondary | ICD-10-CM

## 2021-01-19 DIAGNOSIS — L84 Corns and callosities: Secondary | ICD-10-CM | POA: Diagnosis not present

## 2021-01-19 DIAGNOSIS — G8929 Other chronic pain: Secondary | ICD-10-CM

## 2021-01-19 DIAGNOSIS — M2042 Other hammer toe(s) (acquired), left foot: Secondary | ICD-10-CM | POA: Diagnosis not present

## 2021-01-19 DIAGNOSIS — E1151 Type 2 diabetes mellitus with diabetic peripheral angiopathy without gangrene: Secondary | ICD-10-CM | POA: Diagnosis not present

## 2021-01-19 DIAGNOSIS — M2010 Hallux valgus (acquired), unspecified foot: Secondary | ICD-10-CM

## 2021-01-19 DIAGNOSIS — M2041 Other hammer toe(s) (acquired), right foot: Secondary | ICD-10-CM | POA: Diagnosis not present

## 2021-01-19 NOTE — Progress Notes (Signed)
SITUATION Reason for Visit: Dispensation and Fitting of [place] Patient Report: Patient reports comfort in ambulation and understands all instructions.  OBJECTIVE DATA Patient History / Diagnosis:  No Change in Pathology Provided Device:  Custom Molded Gauntlet: Style [place] Standard     - 1x pair [phone] custom molded [employer] Shoes     - 3x pair custom [id number] insoles  Goals of Orthosis: - Improve gait - Decrease energy expenditure during the gait cycle - Improve balance - Stabilize motion at ankle and subtalar joint - Compensate for muscle weakness - Facilitate motion - Provide triplanar ankle and foot stabilization for weight bearing activities  Device Justification: - Patient is ambulatory  - Device is medically necessary as part of the overall treatment due to the patient's condition and related symptoms - It is anticipated that the patient will benefit functionally with use of the device.  - The custom device is utilized in an attempt to avoid the need for surgery and because a prefabricated device is inappropriate.  Upon gait analysis, the device appeared to be fitting well and the patient states that the device is comfortable.  ACTIONS PERFORMED Patient was fit with Dance movement psychotherapist. Patient tolerated fitting procedure. Fit of the device is good. Patient was able to apply properly and ambulate without distress. Device function is to restrict and limit motion and provide stabilization in the ankle joint.   Goals and function of this device were explained in detail to the patient. The patient was shown how to properly apply, wear, and care for the device. It was explained that the device will fit and function best in an adjustable-closure shoe with a firm heel counter and a wide base of support. When the device was dispensed, it was suitable for the patient's condition and not substandard. No guarantees were given. Precautions were reviewed.   Written  instructions, warranty information, and a copy of DMEPOS Supplier Standards were provided. All questions answered and concerns addressed.  PLAN Patient is to follow up in one week or as necessary (PRN). Plan of care was discussed with and agreed upon by patient.

## 2021-02-03 ENCOUNTER — Encounter: Payer: Self-pay | Admitting: Podiatry

## 2021-02-03 ENCOUNTER — Ambulatory Visit (INDEPENDENT_AMBULATORY_CARE_PROVIDER_SITE_OTHER): Payer: Medicare HMO | Admitting: Podiatry

## 2021-02-03 ENCOUNTER — Other Ambulatory Visit: Payer: Self-pay

## 2021-02-03 DIAGNOSIS — M79676 Pain in unspecified toe(s): Secondary | ICD-10-CM | POA: Diagnosis not present

## 2021-02-03 DIAGNOSIS — E0851 Diabetes mellitus due to underlying condition with diabetic peripheral angiopathy without gangrene: Secondary | ICD-10-CM | POA: Diagnosis not present

## 2021-02-03 DIAGNOSIS — M129 Arthropathy, unspecified: Secondary | ICD-10-CM

## 2021-02-03 DIAGNOSIS — L84 Corns and callosities: Secondary | ICD-10-CM

## 2021-02-03 DIAGNOSIS — B351 Tinea unguium: Secondary | ICD-10-CM | POA: Diagnosis not present

## 2021-02-03 NOTE — Progress Notes (Signed)
This patient returns to my office for at risk foot care.  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  This patient is unable to cut nails himself since the patient cannot reach his nails.These nails are painful walking and wearing shoes. Patient also has painful callus  on both feet since he is unable to self treat.  Patient has rearfoot arthritis which has been treated with a new brace. This patient presents for at risk foot care today.  General Appearance  Alert, conversant and in no acute stress.  Vascular  Dorsalis pedis and posterior tibial  pulses are not  palpable  bilaterally.  Capillary return is within normal limits  bilaterally. Temperature is within normal limits  bilaterally.  Neurologic  Senn-Weinstein monofilament wire test diminished   bilaterally. Muscle power within normal limits bilaterally.  Nails Thick disfigured discolored nails with subungual debris  from hallux to fifth toes bilaterally. No evidence of bacterial infection or drainage bilaterally.  Orthopedic  No limitations of motion  feet .  No crepitus or effusions noted.  No bony pathology or digital deformities noted. 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  Skin  normotropic skin  noted bilaterally.  No signs of infections or ulcers noted.   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  Callus left hallux.  Callus midfoot right    Porokeratosis B/L.    Consent was obtained for treatment procedures.   Debride callus with # 15 blade. Followed by dremel tool usage.  No infection or ulcer.     Return office visit   3 months                  Told patient to return for periodic foot care and evaluation due to potential at risk complications.     Travontae Freiberger DPM  

## 2021-02-15 DIAGNOSIS — C329 Malignant neoplasm of larynx, unspecified: Secondary | ICD-10-CM | POA: Diagnosis not present

## 2021-02-15 DIAGNOSIS — I10 Essential (primary) hypertension: Secondary | ICD-10-CM | POA: Diagnosis not present

## 2021-02-15 DIAGNOSIS — J382 Nodules of vocal cords: Secondary | ICD-10-CM | POA: Diagnosis not present

## 2021-02-15 DIAGNOSIS — F79 Unspecified intellectual disabilities: Secondary | ICD-10-CM | POA: Diagnosis not present

## 2021-02-15 DIAGNOSIS — Z87891 Personal history of nicotine dependence: Secondary | ICD-10-CM | POA: Diagnosis not present

## 2021-02-15 DIAGNOSIS — E785 Hyperlipidemia, unspecified: Secondary | ICD-10-CM | POA: Diagnosis not present

## 2021-03-02 ENCOUNTER — Telehealth: Payer: Self-pay | Admitting: Podiatry

## 2021-03-02 NOTE — Telephone Encounter (Signed)
Pts sister [name] [facility]) left message yesterday about the bill they got for the pt for over 200.00  I returned call and explained that his [employer] paid 20% and medicaid did not cover it and so the balance is his 20% coinsurance from [employer]. I explained they could set up payment plan if needed.   She asked me to make sure when they order again to please make them aware of the 20% coinsurance.

## 2021-03-09 NOTE — Progress Notes (Signed)
Office Visit Note  Patient: Arthur Weaver             Date of Birth: [birth year]           MRN: [id number]             PCP: Velna Hatchet, MD Referring: Velna Hatchet, MD Visit Date: 03/23/2021 Occupation: @GUAROCC @  Subjective:  Arthralgias  History of Present Illness: MAMOUDOU MULVEHILL is a 61 y.o. male with history of osteoarthritis and DDD.  Patient presents today with some increased discomfort in his lower back, left shoulder, and both knees.  He denies any joint swelling at this time.  He has been using Voltaren gel topically as needed for pain.  He continues to ride his stationary bike on a daily basis for exercise.  In regards to his lower back pain he has not had any symptoms of radiculopathy or lower extremity weakness.  He has not had any recent falls.  He states back in the fall 2022 he underwent Visco gel injections in both knees at [facility] which provided significant pain relief.  Overall his symptoms have been tolerable.  He requested a refill of Voltaren gel.  He denies any active psoriasis at this time but uses triamcinolone topically as needed for rashes.   Activities of Daily Living:  Patient reports morning stiffness for a few minutes.   Patient Denies nocturnal pain.  Difficulty dressing/grooming: Denies Difficulty climbing stairs: Reports Difficulty getting out of chair: Reports Difficulty using hands for taps, buttons, cutlery, and/or writing: Denies  Review of Systems  Constitutional:  Positive for fatigue.  HENT:  Positive for nosebleeds. Negative for mouth sores, mouth dryness and nose dryness.   Eyes:  Positive for dryness. Negative for pain and itching.  Respiratory:  Positive for shortness of breath. Negative for difficulty breathing.   Cardiovascular:  Negative for chest pain and palpitations.  Gastrointestinal:  Negative for blood in stool, constipation and diarrhea.  Endocrine: Negative for increased urination.  Genitourinary:  Negative  for difficulty urinating.  Musculoskeletal:  Positive for joint pain, joint pain, myalgias, morning stiffness and myalgias. Negative for joint swelling and muscle tenderness.  Skin:  Positive for color change. Negative for rash and redness.  Allergic/Immunologic: Positive for susceptible to infections.  Neurological:  Positive for headaches. Negative for dizziness, numbness and weakness.  Hematological:  Positive for bruising/bleeding tendency.  Psychiatric/Behavioral:  Negative for sleep disturbance.    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   Retention of urine 10/23/2017   Benign prostate hyperplasia 10/22/2017   Urinary urgency 10/22/2017   Acquired hammer toes of both feet 05/12/2017   Callosity 04/07/2017   Elevated levels of transaminase & lactic acid dehydrogenase 09/15/2016   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   Tendonitis 09/25/2015   Encounter for general adult medical examination without abnormal findings 07/30/2014   Gastro-esophageal reflux disease without esophagitis 05/28/2013   Vitamin D deficiency 05/28/2013   High blood pressure 09/07/2010   High cholesterol 09/07/2010   Bilateral swelling of feet 09/07/2010   Diabetes mellitus 09/07/2010   Rheumatoid arthritis(714.0) 09/07/2010  Personal history of colonic polyps 10/27/2004    Past Medical History:  Diagnosis Date   Anxiety    At risk for sleep apnea    STOP-BANG= 5       SENT TO PCP 04-14-2016   BPH (benign prostatic hyperplasia)    DDD (degenerative disc disease), cervical    DDD  (degenerative disc disease), lumbar    Diverticulosis of colon    Family history of factor V deficiency    per pt's sister (whom is pt's guardian) she, pt's brother and parents have factor V but pt has never been tested   Frequency of urination    GERD (gastroesophageal reflux disease)    History of adenomatous polyp of colon    2006 and 03-18-2010 tubular adenoma   Hyperlipidemia    Hypertension    Lives in independent group home resides at [facility] in [place], [place]   pt independant w/ ADLs w/ exception does not cook for shop    Mentally challenged    SPECIAL NEEDS   OA (osteoarthritis)    hands, knees, feet   Pre-diabetes    Psoriasis    per patient's sister   Thoracic spondylosis    Urgency of urination    Wears partial dentures    lower    Family History  Problem Relation Age of Onset   Breast cancer Mother    Brain cancer Mother    Hypertension Mother    Diabetes Mother    Crohn's disease Father    Rheum arthritis Father    GER disease Father    Ulcers Father    Cataracts Sister    Fibromyalgia Sister    COPD Sister    Breast cancer Sister    GER disease Sister    Hypothyroidism Sister    Osteoarthritis Sister    GER disease Brother    GER disease Brother    Stroke Brother    Liver cancer Brother    Heart attack Brother    Colon cancer Neg Hx    Past Surgical History:  Procedure Laterality Date   CATARACT EXTRACTION W/ INTRAOCULAR LENS  IMPLANT, BILATERAL  2006   CIRCUMCISION  03/29/2007   W/  CYSTOSCOPY AND TRANSRECTAL ULTRASOUND GUIDED PROSTATE BX'S   COLONOSCOPY  last one 08-26-2015   MICROLARYNGOSCOPY  04/20/2020   Procedure: MICROLARYNGOSCOPY WITH BIOPSY;  Surgeon: Jason Coop, DO;  Location: [facility] OR;  Service: ENT;;   THULIUM LASER TURP (TRANSURETHRAL RESECTION OF PROSTATE) N/A 04/21/2016   Procedure: THULIUM LASER TURP (TRANSURETHRAL RESECTION OF PROSTATE);  Surgeon: Carolan Clines, MD;  [facility];  Service: Urology;  Laterality: N/A;   Social History   Social History Narrative   Not on file   Immunization History  Administered Date(s) Administered   Influenza Split 11/07/2012, 11/06/2013   Influenza, Quadrivalent, Recombinant, Inj, Pf 02/21/2018, 10/25/2018, 11/11/2019   Influenza,inj,Quad PF,6+ Mos 11/06/2013, 11/07/2014   Moderna Sars-Covid-2 Vaccination 02/18/2019, 03/18/2019, 03/27/2020   Tdap 02/08/2011     Objective: Vital Signs: BP (!) 148/84 (BP Location: Right Arm, Patient Position: Sitting, Cuff Size: Normal)    Pulse 77    Resp 17    Ht 5\' 9"  (1.753 m)    Wt 204 lb (92.5 kg)    BMI 30.13 kg/m    Physical Exam Vitals and nursing note reviewed.  Constitutional:      Appearance: He is well-developed.  HENT:     Head: Normocephalic and atraumatic.  Eyes:     Conjunctiva/sclera: Conjunctivae normal.     Pupils: Pupils are equal, round, and reactive to light.  Cardiovascular:     Rate and Rhythm: Normal rate and regular rhythm.     Heart sounds: Normal heart sounds.  Pulmonary:     Effort: Pulmonary effort is normal.     Breath sounds: Normal breath sounds.  Abdominal:     General: Bowel sounds are normal.     Palpations: Abdomen is soft.  Musculoskeletal:     Cervical back: Normal range of motion and neck supple.  Skin:    General: Skin is warm and dry.     Capillary Refill: Capillary refill takes less than 2 seconds.  Neurological:     Mental Status: He is alert and oriented to person, place, and time.  Psychiatric:        Behavior: Behavior normal.     Musculoskeletal Exam: C-spine has good range of motion with no discomfort.  Some discomfort and slightly limited range of motion of the lumbar spine.  Midline spinal tenderness in the lumbar region was noted.  No SI joint tenderness.  Shoulder joints have good range of motion with some discomfort in the left shoulder.  Elbow joints have good range of motion with no elbow joint line tenderness.  No  olecranon bursitis noted.  Wrist joints, MCPs, PIPs, DIPs have good range of motion with no synovitis.  Complete fist formation bilaterally.  PIP and DIP thickening consistent with osteoarthritis of both hands.  Hip joints have good range of motion with no groin pain.  Knee joints have good range of motion.  Some warmth of the right knee noted.  No knee joint effusion noted.  Patient wearing diabetic shoes.   CDAI Exam: CDAI Score: -- Patient Global: --; Provider Global: -- Swollen: --; Tender: -- Joint Exam 03/23/2021   No joint exam has been documented for this visit   There is currently no information documented on the homunculus. Go to the Rheumatology activity and complete the homunculus joint exam.  Investigation: No additional findings.  Imaging: No results found.  Recent Labs: Lab Results  Component Value Date   WBC 9.0 04/20/2020   HGB 13.6 04/20/2020   PLT 317 04/20/2020   NA 138 04/20/2020   K 3.7 04/20/2020   CL 102 04/20/2020   CO2 29 04/20/2020   GLUCOSE 110 (H) 04/20/2020   BUN 16 04/20/2020   CREATININE 0.74 04/20/2020   CALCIUM 8.7 (L) 04/20/2020   GFRAA >60 10/08/2016    Speciality Comments: No specialty comments available.  Procedures:  No procedures performed Allergies: Penicillins, Bacitracin, Imidurea, Methylisothiazolinone, Neomycin, Polymyxin b, Pork-derived products, Sulfa antibiotics, and Urea   Assessment / Plan:     Visit Diagnoses: Primary osteoarthritis of both hands: He has PIP and DIP thickening consistent with osteoarthritis of both hands.  No tenderness or inflammation was noted on examination today.  He was able to make a complete fist bilaterally.  Discussed the importance of joint protection and muscle strengthening.  Lateral epicondylitis, left elbow: He continues to experience some tenderness over the left lateral epicondyle.  No inflammation was noted on examination today.  He has been using Voltaren gel topically as needed for  symptomatic relief which manages his symptoms.  Primary osteoarthritis of both knees: He experiences intermittent discomfort in both feet.  He underwent Visco gel injections in both knees in fall 2022 performed at [facility].  His discomfort has improved since undergoing Visco gel  injections.  He continues to use Voltaren gel topically as needed for pain relief.  Discussed the importance of lower extremity muscle strengthening.  He was encouraged to continue to use a stationary bike on a daily basis. A refill of voltaren gel was sent to the pharmacy.   Primary osteoarthritis of both feet: Chronic pain in both feet.  He is wearing diabetic shoes.  DDD (degenerative disc disease), thoracic: No midline spinal tenderness in the thoracic region.   DDD (degenerative disc disease), lumbar: He has been experiencing some increased discomfort in his lower back.  He has some midline spinal tenderness in the lumbar region.  No SI joint tenderness was noted.  He is not experiencing any lower extremity muscle weakness or symptoms of radiculopathy.  Discussed the importance of core strengthening and weight loss as well as back exercises.  He was given a handout of back exercises to perform.  He can use a heating pad as needed for symptomatic relief.  He was advised to notify us if his discomfort persists or worsens.  Psoriasis: He has no active psoriasis at this time.  He can use triamcinolone cream topically as needed for rashes. A refill was provided to the patient.   Leg length discrepancy   Other medical conditions are listed as follows:   History of diabetes mellitus  History of hypercholesterolemia  History of hypertension  COVID-19 virus infection  Orders: No orders of the defined types were placed in this encounter.  Meds ordered this encounter  Medications   diclofenac Sodium (VOLTAREN) 1 % GEL    Sig: Apply 2 g to 4 g to the affected area up to 3 times daily.    Dispense:  400 g     Refill:  4   triamcinolone cream (KENALOG) 0.1 %    Sig: Apply 1 application topically 2 (two) times daily as needed. Use of rashes.    Dispense:  453.6 g    Refill:  0      Follow-Up Instructions: Return in about 6 months (around 09/20/2021) for Osteoarthritis, DDD.   Ofilia Neas, PA-C  Note - This record has been created using Dragon software.  Chart creation errors have been sought, but may not always  have been located. Such creation errors do not reflect on  the standard of medical care.

## 2021-03-23 ENCOUNTER — Encounter: Payer: Self-pay | Admitting: Physician Assistant

## 2021-03-23 ENCOUNTER — Other Ambulatory Visit: Payer: Self-pay

## 2021-03-23 ENCOUNTER — Ambulatory Visit (INDEPENDENT_AMBULATORY_CARE_PROVIDER_SITE_OTHER): Payer: Medicare HMO | Admitting: Physician Assistant

## 2021-03-23 VITALS — BP 148/84 | HR 77 | Resp 17 | Ht 69.0 in | Wt 204.0 lb

## 2021-03-23 DIAGNOSIS — L409 Psoriasis, unspecified: Secondary | ICD-10-CM | POA: Diagnosis not present

## 2021-03-23 DIAGNOSIS — M5134 Other intervertebral disc degeneration, thoracic region: Secondary | ICD-10-CM | POA: Diagnosis not present

## 2021-03-23 DIAGNOSIS — M19072 Primary osteoarthritis, left ankle and foot: Secondary | ICD-10-CM

## 2021-03-23 DIAGNOSIS — M19041 Primary osteoarthritis, right hand: Secondary | ICD-10-CM | POA: Diagnosis not present

## 2021-03-23 DIAGNOSIS — M170 Bilateral primary osteoarthritis of knee: Secondary | ICD-10-CM

## 2021-03-23 DIAGNOSIS — Z8679 Personal history of other diseases of the circulatory system: Secondary | ICD-10-CM

## 2021-03-23 DIAGNOSIS — M19042 Primary osteoarthritis, left hand: Secondary | ICD-10-CM

## 2021-03-23 DIAGNOSIS — U071 COVID-19: Secondary | ICD-10-CM

## 2021-03-23 DIAGNOSIS — M7712 Lateral epicondylitis, left elbow: Secondary | ICD-10-CM | POA: Diagnosis not present

## 2021-03-23 DIAGNOSIS — C329 Malignant neoplasm of larynx, unspecified: Secondary | ICD-10-CM | POA: Diagnosis not present

## 2021-03-23 DIAGNOSIS — Z8639 Personal history of other endocrine, nutritional and metabolic disease: Secondary | ICD-10-CM

## 2021-03-23 DIAGNOSIS — E785 Hyperlipidemia, unspecified: Secondary | ICD-10-CM | POA: Diagnosis not present

## 2021-03-23 DIAGNOSIS — I10 Essential (primary) hypertension: Secondary | ICD-10-CM | POA: Diagnosis not present

## 2021-03-23 DIAGNOSIS — E1169 Type 2 diabetes mellitus with other specified complication: Secondary | ICD-10-CM | POA: Diagnosis not present

## 2021-03-23 DIAGNOSIS — M2170 Unequal limb length (acquired), unspecified site: Secondary | ICD-10-CM

## 2021-03-23 DIAGNOSIS — M19071 Primary osteoarthritis, right ankle and foot: Secondary | ICD-10-CM | POA: Diagnosis not present

## 2021-03-23 DIAGNOSIS — M5136 Other intervertebral disc degeneration, lumbar region: Secondary | ICD-10-CM | POA: Diagnosis not present

## 2021-03-23 MED ORDER — DICLOFENAC SODIUM 1 % EX GEL: CUTANEOUS | 4 refills | Status: DC

## 2021-03-23 MED ORDER — TRIAMCINOLONE ACETONIDE 0.1 % EX CREA: 1.0000 "application " | TOPICAL_CREAM | Freq: Two times a day (BID) | CUTANEOUS | 0 refills | Status: AC | PRN

## 2021-03-23 NOTE — Patient Instructions (Signed)
Back Exercises These exercises help to make your trunk and back strong. They also help to keep the lower back flexible. Doing these exercises can help to prevent or lessen pain in your lower back. If you have back pain, try to do these exercises 2-3 times each day or as told by your doctor. As you get better, do the exercises once each day. Repeat the exercises more often as told by your doctor. To stop back pain from coming back, do the exercises once each day, or as told by your doctor. Do exercises exactly as told by your doctor. Stop right away if you feel sudden pain or your pain gets worse. Exercises Single knee to chest Do these steps 3-5 times in a row for each leg: Lie on your back on a firm bed or the floor with your legs stretched out. Bring one knee to your chest. Grab your knee or thigh with both hands and hold it in place. Pull on your knee until you feel a gentle stretch in your lower back or butt. Keep doing the stretch for 10-30 seconds. Slowly let go of your leg and straighten it. Pelvic tilt Do these steps 5-10 times in a row: Lie on your back on a firm bed or the floor with your legs stretched out. Bend your knees so they point up to the ceiling. Your feet should be flat on the floor. Tighten your lower belly (abdomen) muscles to press your lower back against the floor. This will make your tailbone point up to the ceiling instead of pointing down to your feet or the floor. Stay in this position for 5-10 seconds while you gently tighten your muscles and breathe evenly. Cat-cow Do these steps until your lower back bends more easily: Get on your hands and knees on a firm bed or the floor. Keep your hands under your shoulders, and keep your knees under your hips. You may put padding under your knees. Let your head hang down toward your chest. Tighten (contract) the muscles in your belly. Point your tailbone toward the floor so your lower back becomes rounded like the back of a  cat. Stay in this position for 5 seconds. Slowly lift your head. Let the muscles of your belly relax. Point your tailbone up toward the ceiling so your back forms a sagging arch like the back of a cow. Stay in this position for 5 seconds.  Press-ups Do these steps 5-10 times in a row: Lie on your belly (face-down) on a firm bed or the floor. Place your hands near your head, about shoulder-width apart. While you keep your back relaxed and keep your hips on the floor, slowly straighten your arms to raise the top half of your body and lift your shoulders. Do not use your back muscles. You may change where you place your hands to make yourself more comfortable. Stay in this position for 5 seconds. Keep your back relaxed. Slowly return to lying flat on the floor.  Bridges Do these steps 10 times in a row: Lie on your back on a firm bed or the floor. Bend your knees so they point up to the ceiling. Your feet should be flat on the floor. Your arms should be flat at your sides, next to your body. Tighten your butt muscles and lift your butt off the floor until your waist is almost as high as your knees. If you do not feel the muscles working in your butt and the back of  your thighs, slide your feet 1-2 inches (2.5-5 cm) farther away from your butt. Stay in this position for 3-5 seconds. Slowly lower your butt to the floor, and let your butt muscles relax. If this exercise is too easy, try doing it with your arms crossed over your chest. Belly crunches Do these steps 5-10 times in a row: Lie on your back on a firm bed or the floor with your legs stretched out. Bend your knees so they point up to the ceiling. Your feet should be flat on the floor. Cross your arms over your chest. Tip your chin a little bit toward your chest, but do not bend your neck. Tighten your belly muscles and slowly raise your chest just enough to lift your shoulder blades a tiny bit off the floor. Avoid raising your body  higher than that because it can put too much stress on your lower back. Slowly lower your chest and your head to the floor. Back lifts Do these steps 5-10 times in a row: Lie on your belly (face-down) with your arms at your sides, and rest your forehead on the floor. Tighten the muscles in your legs and your butt. Slowly lift your chest off the floor while you keep your hips on the floor. Keep the back of your head in line with the curve in your back. Look at the floor while you do this. Stay in this position for 3-5 seconds. Slowly lower your chest and your face to the floor. Contact a doctor if: Your back pain gets a lot worse when you do an exercise. Your back pain does not get better within 2 hours after you exercise. If you have any of these problems, stop doing the exercises. Do not do them again unless your doctor says it is okay. Get help right away if: You have sudden, very bad back pain. If this happens, stop doing the exercises. Do not do them again unless your doctor says it is okay. This information is not intended to replace advice given to you by your health care provider. Make sure you discuss any questions you have with your health care provider. Document Revised: 04/08/2020 Document Reviewed: 04/08/2020 Elsevier Patient Education  [place].

## 2021-04-12 DIAGNOSIS — C320 Malignant neoplasm of glottis: Secondary | ICD-10-CM | POA: Diagnosis not present

## 2021-04-12 DIAGNOSIS — Z9889 Other specified postprocedural states: Secondary | ICD-10-CM | POA: Diagnosis not present

## 2021-04-27 DIAGNOSIS — K5900 Constipation, unspecified: Secondary | ICD-10-CM | POA: Diagnosis not present

## 2021-04-27 DIAGNOSIS — F79 Unspecified intellectual disabilities: Secondary | ICD-10-CM | POA: Diagnosis not present

## 2021-05-05 ENCOUNTER — Ambulatory Visit (INDEPENDENT_AMBULATORY_CARE_PROVIDER_SITE_OTHER): Payer: Medicare HMO | Admitting: Podiatry

## 2021-05-05 ENCOUNTER — Encounter: Payer: Self-pay | Admitting: Podiatry

## 2021-05-05 DIAGNOSIS — L84 Corns and callosities: Secondary | ICD-10-CM

## 2021-05-05 DIAGNOSIS — M2010 Hallux valgus (acquired), unspecified foot: Secondary | ICD-10-CM

## 2021-05-05 DIAGNOSIS — M79676 Pain in unspecified toe(s): Secondary | ICD-10-CM

## 2021-05-05 DIAGNOSIS — M129 Arthropathy, unspecified: Secondary | ICD-10-CM

## 2021-05-05 DIAGNOSIS — B351 Tinea unguium: Secondary | ICD-10-CM

## 2021-05-05 DIAGNOSIS — E0851 Diabetes mellitus due to underlying condition with diabetic peripheral angiopathy without gangrene: Secondary | ICD-10-CM

## 2021-05-05 NOTE — Progress Notes (Signed)
This patient returns to my office for at risk foot care.  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  This patient is unable to cut nails himself since the patient cannot reach his nails.These nails are painful walking and wearing shoes. Patient also has painful callus  on both feet since he is unable to self treat.  Patient has rearfoot arthritis which has been treated with a new brace. This patient presents for at risk foot care today. ? ?General Appearance  Alert, conversant and in no acute stress. ? ?Vascular  Dorsalis pedis and posterior tibial  pulses are not  palpable  bilaterally.  Capillary return is within normal limits  bilaterally. Temperature is within normal limits  bilaterally. ? ?Neurologic  Senn-Weinstein monofilament wire test diminished   bilaterally. Muscle power within normal limits bilaterally. ? ?Nails Thick disfigured discolored nails with subungual debris  from hallux to fifth toes bilaterally. No evidence of bacterial infection or drainage bilaterally. ? ?Orthopedic  No limitations of motion  feet .  No crepitus or effusions noted.  No bony pathology or digital deformities noted. 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 ? ?Skin  normotropic skin  noted bilaterally.  No signs of infections or ulcers noted.   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  Callus left hallux.  Callus midfoot right ? ?  Porokeratosis B/L.   ? ?Consent was obtained for treatment procedures.   Debride callus with # 15 blade. Followed by dremel tool usage.  No infection or ulcer.   ? ? ?Return office visit   3 months                  Told patient to return for periodic foot care and evaluation due to potential at risk complications.  Patient requests new diabetic shoes.  To make an appointment with Aaron Edelman. ? ? ?Gardiner Barefoot DPM  ?

## 2021-05-10 ENCOUNTER — Ambulatory Visit: Payer: Medicare HMO

## 2021-05-10 DIAGNOSIS — L84 Corns and callosities: Secondary | ICD-10-CM

## 2021-05-10 DIAGNOSIS — E0851 Diabetes mellitus due to underlying condition with diabetic peripheral angiopathy without gangrene: Secondary | ICD-10-CM

## 2021-05-10 DIAGNOSIS — M2010 Hallux valgus (acquired), unspecified foot: Secondary | ICD-10-CM

## 2021-05-10 NOTE — Progress Notes (Signed)
SITUATION ?Reason for Consult: Evaluation for Prefabricated Diabetic Shoes and Custom Diabetic Inserts. ?Patient / Caregiver Report: Patient would like well fitting shoes ? ?OBJECTIVE DATA: ?Patient History / Diagnosis:  ?  ICD-10-CM   ?1. Diabetes mellitus due to underlying condition with diabetic peripheral angiopathy without gangrene, without long-term current use of insulin (HCC)  E08.51   ?  ?2. Acquired hallux valgus, unspecified laterality  M20.10   ?  ?3. Corns and callosities  L84   ?  ? ? ?Physician Treating Diabetes:  Velna Hatchet, MD ? ?Current or Previous Devices:   Tru-Mold production # [id number] ? ?In-Person Foot Examination: ?Ulcers & Callousing:   Historical ?Deformities:    Hallux valgus, medial ankle collapse ?Sensation:    Compromised  ?Shoe Size:     Custom ? ?ORTHOTIC RECOMMENDATION ?Recommended Devices: ?- 1x pair prefabricated PDAC approved diabetic shoes; Patient Selected Custom Tru-Mold White Size Custom ?- 3x pair custom-to-patient PDAC approved vacuum formed diabetic insoles. ? ?GOALS OF SHOES AND INSOLES ?- Reduce shear and pressure ?- Reduce / Prevent callus formation ?- Reduce / Prevent ulceration ?- Protect the fragile healing compromised diabetic foot. ? ?Patient would benefit from diabetic shoes and inserts as patient has diabetes mellitus and the patient has one or more of the following conditions: ?- History of partial or complete amputation of the foot ?- History of previous foot ulceration. ?- History of pre-ulcerative callus ?- Peripheral neuropathy with evidence of callus formation ?- Foot deformity ?- Poor circulation ? ?ACTIONS PERFORMED ?Potential out of pocket cost was communicated to patient. Patient understood and consented to measurement and casting. Patient was casted for insoles via crush box and measured for shoes via brannock device. Procedure was explained and patient tolerated procedure well. All questions were answered and concerns addressed. Casts were shipped to  central fabrication for HOLD until Certificate of Medical Necessity or otherwise necessary authorization from insurance is obtained. ? ?PLAN ?Shoes are to be ordered and casts released from hold once all appropriate paperwork is complete. Patient is to be contacted and scheduled for fitting once shoes and insoles have been fabricated and received. ? ?

## 2021-05-27 ENCOUNTER — Telehealth: Payer: Self-pay

## 2021-05-27 NOTE — Telephone Encounter (Signed)
CMN Received - Casts sent to [facility] for fabrication ?

## 2021-06-02 DIAGNOSIS — K219 Gastro-esophageal reflux disease without esophagitis: Secondary | ICD-10-CM | POA: Diagnosis not present

## 2021-06-02 DIAGNOSIS — Z08 Encounter for follow-up examination after completed treatment for malignant neoplasm: Secondary | ICD-10-CM | POA: Diagnosis not present

## 2021-06-02 DIAGNOSIS — I10 Essential (primary) hypertension: Secondary | ICD-10-CM | POA: Diagnosis not present

## 2021-06-02 DIAGNOSIS — E785 Hyperlipidemia, unspecified: Secondary | ICD-10-CM | POA: Diagnosis not present

## 2021-06-02 DIAGNOSIS — Z8521 Personal history of malignant neoplasm of larynx: Secondary | ICD-10-CM | POA: Diagnosis not present

## 2021-06-02 DIAGNOSIS — F79 Unspecified intellectual disabilities: Secondary | ICD-10-CM | POA: Diagnosis not present

## 2021-06-02 DIAGNOSIS — Z87891 Personal history of nicotine dependence: Secondary | ICD-10-CM | POA: Diagnosis not present

## 2021-06-03 DIAGNOSIS — N312 Flaccid neuropathic bladder, not elsewhere classified: Secondary | ICD-10-CM | POA: Diagnosis not present

## 2021-06-03 DIAGNOSIS — R3915 Urgency of urination: Secondary | ICD-10-CM | POA: Diagnosis not present

## 2021-06-03 DIAGNOSIS — R339 Retention of urine, unspecified: Secondary | ICD-10-CM | POA: Diagnosis not present

## 2021-06-03 DIAGNOSIS — N401 Enlarged prostate with lower urinary tract symptoms: Secondary | ICD-10-CM | POA: Diagnosis not present

## 2021-06-03 DIAGNOSIS — R350 Frequency of micturition: Secondary | ICD-10-CM | POA: Diagnosis not present

## 2021-06-24 ENCOUNTER — Telehealth: Payer: Self-pay

## 2021-06-24 NOTE — Telephone Encounter (Signed)
Patient's shoes and insoles are ready. Left VM for patient to call back and schedule

## 2021-06-25 DIAGNOSIS — Z5181 Encounter for therapeutic drug level monitoring: Secondary | ICD-10-CM | POA: Diagnosis not present

## 2021-06-25 DIAGNOSIS — F79 Unspecified intellectual disabilities: Secondary | ICD-10-CM | POA: Diagnosis not present

## 2021-06-25 DIAGNOSIS — Z79899 Other long term (current) drug therapy: Secondary | ICD-10-CM | POA: Diagnosis not present

## 2021-06-25 DIAGNOSIS — K5900 Constipation, unspecified: Secondary | ICD-10-CM | POA: Diagnosis not present

## 2021-06-25 DIAGNOSIS — K219 Gastro-esophageal reflux disease without esophagitis: Secondary | ICD-10-CM | POA: Diagnosis not present

## 2021-06-29 ENCOUNTER — Ambulatory Visit: Payer: Medicare HMO

## 2021-06-29 DIAGNOSIS — E0851 Diabetes mellitus due to underlying condition with diabetic peripheral angiopathy without gangrene: Secondary | ICD-10-CM

## 2021-06-29 NOTE — Progress Notes (Signed)
Attempted to fit custom shoes, right proximal strap was short by two inches and would not close over patient's brace. Shoes sent back to [facility] for strap lengthening.

## 2021-07-07 DIAGNOSIS — I10 Essential (primary) hypertension: Secondary | ICD-10-CM | POA: Diagnosis not present

## 2021-07-07 DIAGNOSIS — C329 Malignant neoplasm of larynx, unspecified: Secondary | ICD-10-CM | POA: Diagnosis not present

## 2021-07-07 DIAGNOSIS — E1169 Type 2 diabetes mellitus with other specified complication: Secondary | ICD-10-CM | POA: Diagnosis not present

## 2021-07-07 DIAGNOSIS — F330 Major depressive disorder, recurrent, mild: Secondary | ICD-10-CM | POA: Diagnosis not present

## 2021-07-21 DIAGNOSIS — F330 Major depressive disorder, recurrent, mild: Secondary | ICD-10-CM | POA: Diagnosis not present

## 2021-07-21 DIAGNOSIS — C329 Malignant neoplasm of larynx, unspecified: Secondary | ICD-10-CM | POA: Diagnosis not present

## 2021-07-21 DIAGNOSIS — E1169 Type 2 diabetes mellitus with other specified complication: Secondary | ICD-10-CM | POA: Diagnosis not present

## 2021-07-21 DIAGNOSIS — I10 Essential (primary) hypertension: Secondary | ICD-10-CM | POA: Diagnosis not present

## 2021-08-06 ENCOUNTER — Ambulatory Visit: Payer: Medicare HMO | Admitting: Podiatry

## 2021-08-12 DIAGNOSIS — R0981 Nasal congestion: Secondary | ICD-10-CM | POA: Diagnosis not present

## 2021-08-12 DIAGNOSIS — J309 Allergic rhinitis, unspecified: Secondary | ICD-10-CM | POA: Diagnosis not present

## 2021-08-12 DIAGNOSIS — Z87891 Personal history of nicotine dependence: Secondary | ICD-10-CM | POA: Diagnosis not present

## 2021-08-12 DIAGNOSIS — Z8521 Personal history of malignant neoplasm of larynx: Secondary | ICD-10-CM | POA: Diagnosis not present

## 2021-08-16 ENCOUNTER — Encounter: Payer: Self-pay | Admitting: Podiatry

## 2021-08-16 ENCOUNTER — Ambulatory Visit (INDEPENDENT_AMBULATORY_CARE_PROVIDER_SITE_OTHER): Payer: Medicare HMO | Admitting: Podiatry

## 2021-08-16 DIAGNOSIS — M129 Arthropathy, unspecified: Secondary | ICD-10-CM

## 2021-08-16 DIAGNOSIS — M79676 Pain in unspecified toe(s): Secondary | ICD-10-CM | POA: Diagnosis not present

## 2021-08-16 DIAGNOSIS — B351 Tinea unguium: Secondary | ICD-10-CM

## 2021-08-16 DIAGNOSIS — M2010 Hallux valgus (acquired), unspecified foot: Secondary | ICD-10-CM

## 2021-08-16 DIAGNOSIS — E0851 Diabetes mellitus due to underlying condition with diabetic peripheral angiopathy without gangrene: Secondary | ICD-10-CM

## 2021-08-16 DIAGNOSIS — L84 Corns and callosities: Secondary | ICD-10-CM

## 2021-08-16 NOTE — Progress Notes (Signed)
This patient returns to my office for at risk foot care.  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  This patient is unable to cut nails himself since the patient cannot reach his nails.These nails are painful walking and wearing shoes. Patient also has painful callus  on both feet since he is unable to self treat.  Patient has rearfoot arthritis which has been treated with a new brace. This patient presents for at risk foot care today.  General Appearance  Alert, conversant and in no acute stress.  Vascular  Dorsalis pedis and posterior tibial  pulses are not  palpable  bilaterally.  Capillary return is within normal limits  bilaterally. Temperature is within normal limits  bilaterally.  Neurologic  Senn-Weinstein monofilament wire test diminished   bilaterally. Muscle power within normal limits bilaterally.  Nails Thick disfigured discolored nails with subungual debris  from hallux to fifth toes bilaterally. No evidence of bacterial infection or drainage bilaterally.  Orthopedic  No limitations of motion  feet .  No crepitus or effusions noted.  No bony pathology or digital deformities noted. 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  Skin  normotropic skin  noted bilaterally.  No signs of infections or ulcers noted.   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  Callus left hallux.  Callus midfoot right    Porokeratosis B/L.    Consent was obtained for treatment procedures.   Debride callus with # 15 blade. Followed by dremel tool usage.  No infection or ulcer.     Return office visit   3 months                  Told patient to return for periodic foot care and evaluation due to potential at risk complications.     Gardiner Barefoot DPM

## 2021-09-07 NOTE — Progress Notes (Signed)
Office Visit Note  Patient: Arthur Weaver             Date of Birth: [birth year]           MRN: [id number]             PCP: Velna Hatchet, MD Referring: Velna Hatchet, MD Visit Date: 09/21/2021 Occupation: '@GUAROCC'$ @  Subjective:  Left elbow pain and lower back pain  History of Present Illness: Arthur Weaver is a 61 y.o. male accompanied by his sister today.  He complains of pain and discomfort in multiple joints.  He states he continues to have pain and discomfort in his left elbow.  He had no relief from exercises in the past.  He also continues to have lower back pain.  He denies any radiculopathy.  He has discomfort in his knee joints.  He denies history of joint swelling.  Activities of Daily Living:  Patient reports morning stiffness for a few minutes.   Patient Reports nocturnal pain.  Difficulty dressing/grooming: Denies Difficulty climbing stairs: Reports Difficulty getting out of chair: Reports Difficulty using hands for taps, buttons, cutlery, and/or writing: Reports  Review of Systems  Constitutional:  Positive for fatigue.  HENT:  Positive for mouth dryness. Negative for mouth sores.   Eyes:  Positive for dryness.  Respiratory:  Negative for shortness of breath.   Cardiovascular:  Negative for chest pain and palpitations.  Gastrointestinal:  Negative for blood in stool, constipation and diarrhea.  Endocrine: Negative for increased urination.  Genitourinary:  Negative for involuntary urination.  Musculoskeletal:  Positive for joint pain, gait problem, joint pain and muscle weakness. Negative for joint swelling, myalgias, morning stiffness, muscle tenderness and myalgias.  Skin:  Negative for color change, hair loss and sensitivity to sunlight.  Allergic/Immunologic: Negative for susceptible to infections.  Neurological:  Negative for dizziness and headaches.  Hematological:  Negative for swollen glands.  Psychiatric/Behavioral:  Negative for depressed mood and  sleep disturbance. The patient is not nervous/anxious.     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   Retention of urine 10/23/2017   Benign prostate hyperplasia 10/22/2017   Urinary urgency 10/22/2017   Acquired hammer toes of both feet 05/12/2017   Callosity 04/07/2017   Elevated levels of transaminase & lactic acid dehydrogenase 09/15/2016   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   Tendonitis 09/25/2015   Encounter for general adult medical examination without abnormal findings 07/30/2014   Gastro-esophageal reflux disease without esophagitis 05/28/2013   Vitamin D deficiency 05/28/2013   High blood pressure 09/07/2010   High cholesterol 09/07/2010   Bilateral swelling of feet 09/07/2010   Diabetes mellitus 09/07/2010   Rheumatoid arthritis(714.0) 09/07/2010   Personal history of colonic polyps 10/27/2004    Past Medical History:  Diagnosis Date   Anxiety    At risk for sleep apnea    STOP-BANG= 5       SENT TO PCP 04-14-2016   BPH (benign prostatic hyperplasia)    DDD (degenerative disc disease), cervical    DDD (degenerative disc disease), lumbar    Diverticulosis of colon  Family history of factor V deficiency    per pt's sister (whom is pt's guardian) she, pt's brother and parents have factor V but pt has never been tested   Frequency of urination    GERD (gastroesophageal reflux disease)    History of adenomatous polyp of colon    2006 and 03-18-2010 tubular adenoma   Hyperlipidemia    Hypertension    Lives in independent group home resides at [facility] in [place], [place]   pt independant w/ ADLs w/ exception does not cook for shop    Mentally challenged    SPECIAL NEEDS   OA (osteoarthritis)    hands, knees, feet   Pre-diabetes    Psoriasis    per patient's sister   Thoracic spondylosis    Urgency of urination    Wears partial dentures    lower    Family History  Problem Relation Age of Onset   Breast cancer Mother    Brain cancer Mother    Hypertension Mother    Diabetes Mother    Crohn's disease Father    Rheum arthritis Father    GER disease Father    Ulcers Father    Cataracts Sister    Fibromyalgia Sister    COPD Sister    Breast cancer Sister    GER disease Sister    Hypothyroidism Sister    Osteoarthritis Sister    GER disease Brother    GER disease Brother    Stroke Brother    Liver cancer Brother    Heart attack Brother    Colon cancer Neg Hx    Past Surgical History:  Procedure Laterality Date   CATARACT EXTRACTION W/ INTRAOCULAR LENS  IMPLANT, BILATERAL  2006   CIRCUMCISION  03/29/2007   W/  CYSTOSCOPY AND TRANSRECTAL ULTRASOUND GUIDED PROSTATE BX'S   COLONOSCOPY  last one 08-26-2015   MICROLARYNGOSCOPY  04/20/2020   Procedure: MICROLARYNGOSCOPY WITH BIOPSY;  Surgeon: Jason Coop, DO;  Location: [facility] OR;  Service: ENT;;   THULIUM LASER TURP (TRANSURETHRAL RESECTION OF PROSTATE) N/A 04/21/2016   Procedure: THULIUM LASER TURP (TRANSURETHRAL RESECTION OF PROSTATE);  Surgeon: Carolan Clines, MD;  Location: [facility];  Service: Urology;  Laterality: N/A;   Social History   Social History Narrative   Not on file   Immunization History  Administered Date(s) Administered   Influenza Split 11/07/2012, 11/06/2013   Influenza, Quadrivalent, Recombinant, Inj, Pf 02/21/2018, 10/25/2018, 11/11/2019   Influenza,inj,Quad PF,6+ Mos 11/06/2013, 11/07/2014   Moderna Sars-Covid-2 Vaccination 02/18/2019, 03/18/2019, 03/27/2020   Tdap 02/08/2011     Objective: Vital  Signs: BP 130/80 (BP Location: Left Arm, Patient Position: Sitting, Cuff Size: Normal)   Pulse 91   Resp 18   Ht '5\' 9"'$  (1.753 m)   Wt 207 lb 3.2 oz (94 kg)   BMI 30.60 kg/m    Physical Exam Vitals and nursing note reviewed.  Constitutional:      Appearance: He is well-developed.  HENT:     Head: Normocephalic and atraumatic.  Eyes:     Conjunctiva/sclera: Conjunctivae normal.     Pupils: Pupils are equal, round, and reactive to light.  Cardiovascular:     Rate and Rhythm: Normal rate and regular rhythm.     Heart sounds: Normal heart sounds.  Pulmonary:     Effort: Pulmonary effort is normal.     Breath sounds: Normal breath sounds.  Abdominal:     General: Bowel sounds are normal.  Palpations: Abdomen is soft.  Musculoskeletal:     Cervical back: Normal range of motion and neck supple.  Skin:    General: Skin is warm and dry.     Capillary Refill: Capillary refill takes less than 2 seconds.  Neurological:     Mental Status: He is alert and oriented to person, place, and time.  Psychiatric:        Behavior: Behavior normal.      Musculoskeletal Exam: C-spine was in good range of motion.  He had thoracic kyphosis.  He had tenderness and discomfort with range of motion of the lumbar spine.  Shoulder joints, elbow joints, wrist joints, MCPs PIPs and DIPs with good range of motion.  He had bilateral PIP and DIP thickening.  He had tenderness on palpation over left lateral epicondyle region.  Hip joints and knee joints with good range of motion.  No warmth swelling or effusion was noted.  CDAI Exam: CDAI Score: -- Patient Global: --; Provider Global: -- Swollen: --; Tender: -- Joint Exam 09/21/2021   No joint exam has been documented for this visit   There is currently no information documented on the homunculus. Go to the Rheumatology activity and complete the homunculus joint exam.  Investigation: No additional findings.  Imaging: No results found.  Recent  Labs: Lab Results  Component Value Date   WBC 9.0 04/20/2020   HGB 13.6 04/20/2020   PLT 317 04/20/2020   NA 138 04/20/2020   K 3.7 04/20/2020   CL 102 04/20/2020   CO2 29 04/20/2020   GLUCOSE 110 (H) 04/20/2020   BUN 16 04/20/2020   CREATININE 0.74 04/20/2020   CALCIUM 8.7 (L) 04/20/2020   GFRAA >60 10/08/2016    Speciality Comments: No specialty comments available.  Procedures:  Medium Joint Inj: L lateral epicondyle on 09/21/2021 2:52 PM Indications: pain Details: 27 G 1.5 in needle, posterior approach Medications: 1 mL lidocaine 1 %; 30 mg triamcinolone acetonide 40 MG/ML Aspirate: 0 mL Outcome: tolerated well, no immediate complications Procedure, treatment alternatives, risks and benefits explained, specific risks discussed. Consent was given by the patient. Immediately prior to procedure a time out was called to verify the correct patient, procedure, equipment, support staff and site/side marked as required. Patient was prepped and draped in the usual sterile fashion.     Allergies: Penicillins, Bacitracin, Imidurea, Methylisothiazolinone, Neomycin, Polymyxin b, Pork-derived products, Sulfa antibiotics, and Urea   Assessment / Plan:     Visit Diagnoses: Primary osteoarthritis of both hands-he had bilateral PIP and DIP thickening.  No inflammation was noted.  Lateral epicondylitis, left elbow-he had tenderness over left lateral epicondyle.  He states the discomfort is waking him up in the night.  Different treatment options were discussed.  He wanted to proceed with a cortisone injection.  After informed consent was obtained left lateral epicondyle region was injected with lidocaine and cortisone as described above.  Patient tolerated the procedure well.  Postprocedure instructions were given.  A handout on exercises was given.  Primary osteoarthritis of both knees - He underwent Visco gel injections in both knees in fall 2022 performed at [facility].  He has chronic  discomfort in his knee joints.  Primary osteoarthritis of both feet-chronic pain.  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  He denies any discomfort.  DDD (degenerative disc disease), lumbar-he has been experiencing increased pain and discomfort in his lower back.  Core strengthening exercises were discussed and demonstrated.  A handout on exercises was  given.  Psoriasis-he denies any lesions today.  Leg length discrepancy-he has a shoe lift which helps.  History of hypercholesterolemia  History of diabetes mellitus-I advised him to monitor blood glucose closely as cortisone injection can increase blood glucose level.  According to his sister his blood sugar has been running normal.  History of hypertension-his blood pressure was normal today.  He was advised to monitor blood pressure .  Orders: Orders Placed This Encounter  Procedures   Medium Joint Inj   No orders of the defined types were placed in this encounter.    Follow-Up Instructions: Return in about 6 months (around 03/24/2022) for Osteoarthritis.   Bo Merino, MD  Note - This record has been created using Editor, commissioning.  Chart creation errors have been sought, but may not always  have been located. Such creation errors do not reflect on  the standard of medical care.

## 2021-09-16 ENCOUNTER — Ambulatory Visit (INDEPENDENT_AMBULATORY_CARE_PROVIDER_SITE_OTHER): Payer: Medicare HMO

## 2021-09-16 DIAGNOSIS — M2011 Hallux valgus (acquired), right foot: Secondary | ICD-10-CM | POA: Diagnosis not present

## 2021-09-16 DIAGNOSIS — M2010 Hallux valgus (acquired), unspecified foot: Secondary | ICD-10-CM | POA: Diagnosis not present

## 2021-09-16 DIAGNOSIS — M2012 Hallux valgus (acquired), left foot: Secondary | ICD-10-CM | POA: Diagnosis not present

## 2021-09-16 DIAGNOSIS — M129 Arthropathy, unspecified: Secondary | ICD-10-CM | POA: Diagnosis not present

## 2021-09-16 DIAGNOSIS — E1141 Type 2 diabetes mellitus with diabetic mononeuropathy: Secondary | ICD-10-CM | POA: Diagnosis not present

## 2021-09-16 DIAGNOSIS — E0851 Diabetes mellitus due to underlying condition with diabetic peripheral angiopathy without gangrene: Secondary | ICD-10-CM

## 2021-09-16 NOTE — Progress Notes (Signed)
Patient presents to the office today with issues concerning the diabetic shoes picked up on 09/16/21.   The shoes feel like the straps are too short.  We will send the true mold brand, style custom.   Reorder: Extended straps for shoes.  Patient kept insoles for other shoes. Advised to bring back a pair to check fit of reorder.  Patient will be notified for a fitting appointment once the shoes arrive in office.

## 2021-09-21 ENCOUNTER — Ambulatory Visit: Payer: Medicare HMO | Attending: Rheumatology | Admitting: Rheumatology

## 2021-09-21 ENCOUNTER — Encounter: Payer: Self-pay | Admitting: Rheumatology

## 2021-09-21 VITALS — BP 130/80 | HR 91 | Resp 18 | Ht 69.0 in | Wt 207.2 lb

## 2021-09-21 DIAGNOSIS — M7712 Lateral epicondylitis, left elbow: Secondary | ICD-10-CM | POA: Diagnosis not present

## 2021-09-21 DIAGNOSIS — M5136 Other intervertebral disc degeneration, lumbar region: Secondary | ICD-10-CM | POA: Diagnosis not present

## 2021-09-21 DIAGNOSIS — M2170 Unequal limb length (acquired), unspecified site: Secondary | ICD-10-CM | POA: Diagnosis not present

## 2021-09-21 DIAGNOSIS — M19072 Primary osteoarthritis, left ankle and foot: Secondary | ICD-10-CM

## 2021-09-21 DIAGNOSIS — Z8639 Personal history of other endocrine, nutritional and metabolic disease: Secondary | ICD-10-CM | POA: Diagnosis not present

## 2021-09-21 DIAGNOSIS — M19071 Primary osteoarthritis, right ankle and foot: Secondary | ICD-10-CM

## 2021-09-21 DIAGNOSIS — L409 Psoriasis, unspecified: Secondary | ICD-10-CM | POA: Diagnosis not present

## 2021-09-21 DIAGNOSIS — M19041 Primary osteoarthritis, right hand: Secondary | ICD-10-CM

## 2021-09-21 DIAGNOSIS — M19042 Primary osteoarthritis, left hand: Secondary | ICD-10-CM

## 2021-09-21 DIAGNOSIS — M51369 Other intervertebral disc degeneration, lumbar region without mention of lumbar back pain or lower extremity pain: Secondary | ICD-10-CM

## 2021-09-21 DIAGNOSIS — U071 COVID-19: Secondary | ICD-10-CM

## 2021-09-21 DIAGNOSIS — M5134 Other intervertebral disc degeneration, thoracic region: Secondary | ICD-10-CM

## 2021-09-21 DIAGNOSIS — M170 Bilateral primary osteoarthritis of knee: Secondary | ICD-10-CM | POA: Diagnosis not present

## 2021-09-21 DIAGNOSIS — Z8679 Personal history of other diseases of the circulatory system: Secondary | ICD-10-CM

## 2021-09-21 MED ORDER — LIDOCAINE HCL 1 % IJ SOLN
1.0000 mL | INTRAMUSCULAR | Status: AC | PRN
  Administered 2021-09-21: 1 mL

## 2021-09-21 MED ORDER — TRIAMCINOLONE ACETONIDE 40 MG/ML IJ SUSP
30.0000 mg | INTRAMUSCULAR | Status: AC | PRN
  Administered 2021-09-21: 30 mg

## 2021-09-21 NOTE — Patient Instructions (Signed)
Back Exercises The following exercises strengthen the muscles that help to support the trunk (torso) and back. They also help to keep the lower back flexible. Doing these exercises can help to prevent or lessen existing low back pain. If you have back pain or discomfort, try doing these exercises 2-3 times each day or as told by your health care provider. As your pain improves, do them once each day, but increase the number of times that you repeat the steps for each exercise (do more repetitions). To prevent the recurrence of back pain, continue to do these exercises once each day or as told by your health care provider. Do exercises exactly as told by your health care provider and adjust them as directed. It is normal to feel mild stretching, pulling, tightness, or discomfort as you do these exercises, but you should stop right away if you feel sudden pain or your pain gets worse. Exercises Single knee to chest Repeat these steps 3-5 times for each leg: Lie on your back on a firm bed or the floor with your legs extended. Bring one knee to your chest. Your other leg should stay extended and in contact with the floor. Hold your knee in place by grabbing your knee or thigh with both hands and hold. Pull on your knee until you feel a gentle stretch in your lower back or buttocks. Hold the stretch for 10-30 seconds. Slowly release and straighten your leg.  Pelvic tilt Repeat these steps 5-10 times: Lie on your back on a firm bed or the floor with your legs extended. Bend your knees so they are pointing toward the ceiling and your feet are flat on the floor. Tighten your lower abdominal muscles to press your lower back against the floor. This motion will tilt your pelvis so your tailbone points up toward the ceiling instead of pointing to your feet or the floor. With gentle tension and even breathing, hold this position for 5-10 seconds.  Cat-cow Repeat these steps until your lower back becomes  more flexible: Get into a hands-and-knees position on a firm bed or the floor. Keep your hands under your shoulders, and keep your knees under your hips. You may place padding under your knees for comfort. Let your head hang down toward your chest. Contract your abdominal muscles and point your tailbone toward the floor so your lower back becomes rounded like the back of a cat. Hold this position for 5 seconds. Slowly lift your head, let your abdominal muscles relax, and point your tailbone up toward the ceiling so your back forms a sagging arch like the back of a cow. Hold this position for 5 seconds.  Press-ups Repeat these steps 5-10 times: Lie on your abdomen (face-down) on a firm bed or the floor. Place your palms near your head, about shoulder-width apart. Keeping your back as relaxed as possible and keeping your hips on the floor, slowly straighten your arms to raise the top half of your body and lift your shoulders. Do not use your back muscles to raise your upper torso. You may adjust the placement of your hands to make yourself more comfortable. Hold this position for 5 seconds while you keep your back relaxed. Slowly return to lying flat on the floor.  Bridges Repeat these steps 10 times: Lie on your back on a firm bed or the floor. Bend your knees so they are pointing toward the ceiling and your feet are flat on the floor. Your arms should be flat   at your sides, next to your body. Tighten your buttocks muscles and lift your buttocks off the floor until your waist is at almost the same height as your knees. You should feel the muscles working in your buttocks and the back of your thighs. If you do not feel these muscles, slide your feet 1-2 inches (2.5-5 cm) farther away from your buttocks. Hold this position for 3-5 seconds. Slowly lower your hips to the starting position, and allow your buttocks muscles to relax completely. If this exercise is too easy, try doing it with your arms  crossed over your chest. Abdominal crunches Repeat these steps 5-10 times: Lie on your back on a firm bed or the floor with your legs extended. Bend your knees so they are pointing toward the ceiling and your feet are flat on the floor. Cross your arms over your chest. Tip your chin slightly toward your chest without bending your neck. Tighten your abdominal muscles and slowly raise your torso high enough to lift your shoulder blades a tiny bit off the floor. Avoid raising your torso higher than that because it can put too much stress on your lower back and does not help to strengthen your abdominal muscles. Slowly return to your starting position.  Back lifts Repeat these steps 5-10 times: Lie on your abdomen (face-down) with your arms at your sides, and rest your forehead on the floor. Tighten the muscles in your legs and your buttocks. Slowly lift your chest off the floor while you keep your hips pressed to the floor. Keep the back of your head in line with the curve in your back. Your eyes should be looking at the floor. Hold this position for 3-5 seconds. Slowly return to your starting position.  Contact a health care provider if: Your back pain or discomfort gets much worse when you do an exercise. Your worsening back pain or discomfort does not lessen within 2 hours after you exercise. If you have any of these problems, stop doing these exercises right away. Do not do them again unless your health care provider says that you can. Get help right away if: You develop sudden, severe back pain. If this happens, stop doing the exercises right away. Do not do them again unless your health care provider says that you can. This information is not intended to replace advice given to you by your health care provider. Make sure you discuss any questions you have with your health care provider. Document Revised: 07/21/2020 Document Reviewed: 04/08/2020 [employer] Patient Education  2023 [employer]  Elbow and Forearm Exercises Ask your health care provider which exercises are safe for you. Do exercises exactly as told by your health care provider and adjust them as directed. It is normal to feel mild stretching, pulling, tightness, or discomfort as you do these exercises. Stop right away if you feel sudden pain or your pain gets worse. Do not begin these exercises until told by your health care provider. Range-of-motion exercises These exercises warm up your muscles and joints and improve the movement and flexibility of your injured elbow and forearm. The exercises also help to relieve pain, numbness, and tingling. These exercises are done using the muscles in your injured elbow and forearm (active). Elbow flexion, active Hold your left / right arm at your side, and bend your elbow (flexion) as far as you can using only your arm muscles. Hold this position for __________ seconds. Slowly return to the starting position. Repeat __________ times. Complete   this exercise __________ times a day. Elbow extension, active Hold your left / right arm at your side, and straighten your elbow (extension) as much as you can using only your arm muscles. Hold this position for __________ seconds. Slowly return to the starting position. Repeat __________ times. Complete this exercise __________ times a day. Active forearm rotation, supination This is an exercise in which you turn (rotate) your forearm palm up (supination). Stand or sit with your elbows at your sides. Bend your left / right elbow to a 90-degree angle (right angle). Rotate your palm up until you feel a gentle stretch on the inside of your forearm. Hold this position for __________ seconds. Slowly return to the starting position. Repeat __________ times. Complete this exercise __________ times a day. Active forearm rotation, pronation This is an exercise in which you turn (rotate) your forearm palm down (pronation). Stand or sit with  your elbows at your sides. Bend your left / right elbow to a 90-degree angle (right angle). Rotate your palm down until you feel a gentle stretch on the top of your forearm. Hold this position for __________ seconds. Slowly return to the starting position. Repeat __________ times. Complete this exercise __________ times a day. Stretching exercises These exercises warm up your muscles and joints and improve the movement and flexibility of your injured elbow and forearm. These exercises also help to relieve pain, numbness, and tingling. These exercises are done using your healthy elbow and forearm to help stretch the muscles in your injured elbow and forearm (active-assisted). Elbow flexion, active-assisted  Hold your left / right arm at your side, and bend your elbow (flexion) as much as you can using your left / right arm muscles. Use your other hand to bend your left / right elbow farther. To do this, gently push up on your forearm until you feel a gentle stretch on the back of your elbow. Hold this position for __________ seconds. Slowly return to the starting position. Repeat __________ times. Complete this exercise __________ times a day. Elbow extension, active-assisted  Hold your left / right arm at your side, and straighten your elbow (extension) as much as you can using your left / right arm muscles. Use your other hand to straighten the left / right elbow farther. To do this, gently push down on your forearm until you feel a gentle stretch on the inside of your elbow. Hold this position for __________ seconds. Slowly return to the starting position. Repeat __________ times. Complete this exercise __________ times a day. Active-assisted forearm rotation, supination This is an exercise in which you turn (rotate) your forearm palm up (supination). Sit with your left / right elbow bent in a 90-degree angle (right angle) with your forearm resting on a table. Keeping your upper body and  shoulder still, rotate your forearm so your palm faces upward. Use your other hand to help rotate your forearm further until you feel a gentle to moderate stretch. Hold this position for __________ seconds. Slowly release the stretch and return to the starting position. Repeat __________ times. Complete this exercise __________ times a day. Active-assisted forearm rotation, pronation This is an exercise in which you turn (rotate) your forearm palm down (pronation). Sit with your left / right elbow bent in a 90-degree angle (right angle) with your forearm resting on a table. Keeping your upper body and shoulder still, rotate your forearm so your palm faces the tabletop. Use your other hand to help rotate your forearm further until you   feel a gentle to moderate stretch. Hold this position for __________ seconds. Slowly release the stretch and return to the starting position. Repeat __________ times. Complete this exercise __________ times a day. Passive elbow flexion, supine Lie on your back (supine position). Extend your left / right arm up in the air, bracing it with your other hand. Let your left / right hand slowly lower toward your shoulder (passive flexion), while your elbow stays pointed toward the ceiling. You should feel a gentle stretch along the back of your upper arm and elbow. If instructed by your health care provider, you may increase the intensity of your stretch by adding a small wrist weight or hand weight. Hold this position for __________ seconds. Slowly return to the starting position. Repeat __________ times. Complete this exercise __________ times a day. Passive elbow extension, supine  Lie on your back (supine position). Make sure that you are in a comfortable position that lets you relax your arm muscles. Place a folded towel under your left / right upper arm so your elbow and shoulder are at the same height. Straighten your left / right arm so your elbow does not rest  on the bed or towel. Let the weight of your hand stretch your elbow (passive extension). Keep your arm and chest muscles relaxed. You should feel a stretch on the inside of your elbow. If told by your health care provider, you may increase the intensity of your stretch by adding a small wrist weight or hand weight. Hold this position for __________ seconds. Slowly release the stretch. Repeat __________ times. Complete this exercise __________ times a day. Strengthening exercises These exercises build strength and endurance in your elbow and forearm. Endurance is the ability to use your muscles for a long time, even after they get tired. Elbow flexion, isometric  Stand or sit up straight. Bend your left / right elbow in a 90-degree angle (right angle), and keep your forearm at the height of your waist. Your thumb should be pointed toward the ceiling (neutral forearm). Place your other hand on top of your left / right forearm. Gently push down while you resist with your left / right arm (isometric flexion). Push as hard as you can with both arms without causing any pain or movement at your left / right elbow. Hold this position for __________ seconds. Slowly release the tension in both arms. Let your muscles relax completely before you repeat the exercise. Repeat __________ times. Complete this exercise __________ times a day. Elbow extension, isometric  Stand or sit up straight. Place your left / right arm so your palm faces your abdomen and is at the height of your waist. Place your other hand on the underside of your left / right forearm. Gently push up while you resist with your left / right arm (isometric extension). Push as hard as you can with both arms without causing any pain or movement at your left / right elbow. Hold this position for __________ seconds. Slowly release the tension in both arms. Let your muscles relax completely before you repeat the exercise. Repeat __________ times.  Complete this exercise __________ times a day. Elbow flexion with forearm palm up  Sit on a firm chair without armrests, or stand up. Place your left / right arm at your side with your elbow straight and your palm facing forward. Holding a __________weight or gripping a rubber exercise band or tubing, bend your elbow to bring your hand toward your shoulder (flexion). Hold this   position for __________ seconds. Slowly return to the starting position. Repeat __________ times. Complete this exercise __________ times a day. Elbow extension, active  Sit on a firm chair without armrests, or stand up. Hold a rubber exercise band or tubing in both hands. Keeping your upper arms at your sides, bring both hands up to your left / right shoulder. Keep your left / right hand just below your other hand. Straighten your left / right elbow (extension) while keeping your other arm still. Hold this position for __________ seconds. Control the resistance of the band or tubing as you return to the starting position. Repeat __________ times. Complete this exercise __________ times a day. Forearm rotation, supination  Sit with your left / right forearm supported on a table. Your elbow should be at waist height and bent at a 90-degree angle (right angle). Gently grasp a lightweight hammer. Rest your hand over the edge of the table with your palm facing down. Without moving your left / right elbow, slowly rotate your forearm to turn your palm up toward the ceiling (supination). Hold this position for __________ seconds. Slowly return to the starting position. Repeat __________ times. Complete this exercise __________ times a day. Forearm rotation, pronation  Sit with your left / right forearm supported on a table. Keep your elbow below shoulder height. Gently grasp a lightweight hammer. Rest your hand over the edge of the table with your palm facing up. Without moving your left / right elbow, slowly rotate  your forearm to turn your palm down toward the floor (pronation). Hold this position for __________seconds. Slowly return to the starting position. Repeat __________ times. Complete this exercise __________ times a day. This information is not intended to replace advice given to you by your health care provider. Make sure you discuss any questions you have with your health care provider. Document Revised: 05/17/2018 Document Reviewed: 02/14/2018 [employer] Patient Education  2023 [employer]  

## 2021-11-04 DIAGNOSIS — E1151 Type 2 diabetes mellitus with diabetic peripheral angiopathy without gangrene: Secondary | ICD-10-CM | POA: Diagnosis not present

## 2021-11-16 ENCOUNTER — Ambulatory Visit: Payer: Medicare HMO | Admitting: *Deleted

## 2021-11-16 ENCOUNTER — Encounter: Payer: Self-pay | Admitting: Podiatry

## 2021-11-16 ENCOUNTER — Ambulatory Visit (INDEPENDENT_AMBULATORY_CARE_PROVIDER_SITE_OTHER): Payer: Medicare HMO | Admitting: Podiatry

## 2021-11-16 DIAGNOSIS — B351 Tinea unguium: Secondary | ICD-10-CM

## 2021-11-16 DIAGNOSIS — L84 Corns and callosities: Secondary | ICD-10-CM | POA: Diagnosis not present

## 2021-11-16 DIAGNOSIS — E0851 Diabetes mellitus due to underlying condition with diabetic peripheral angiopathy without gangrene: Secondary | ICD-10-CM | POA: Diagnosis not present

## 2021-11-16 DIAGNOSIS — M25571 Pain in right ankle and joints of right foot: Secondary | ICD-10-CM

## 2021-11-16 DIAGNOSIS — M79674 Pain in right toe(s): Secondary | ICD-10-CM

## 2021-11-16 DIAGNOSIS — M79675 Pain in left toe(s): Secondary | ICD-10-CM

## 2021-11-16 DIAGNOSIS — M2011 Hallux valgus (acquired), right foot: Secondary | ICD-10-CM

## 2021-11-16 DIAGNOSIS — G8929 Other chronic pain: Secondary | ICD-10-CM

## 2021-11-16 DIAGNOSIS — M2010 Hallux valgus (acquired), unspecified foot: Secondary | ICD-10-CM

## 2021-11-16 DIAGNOSIS — M129 Arthropathy, unspecified: Secondary | ICD-10-CM

## 2021-11-16 NOTE — Progress Notes (Addendum)
This patient returns to my office for at risk foot care.  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  This patient is unable to cut nails himself since the patient cannot reach his nails.These nails are painful walking and wearing shoes. Patient also has painful callus  on both feet since he is unable to self treat.   This patient presents for at risk foot care today.  General Appearance  Alert, conversant and in no acute stress.  Vascular  Dorsalis pedis and posterior tibial  pulses are not  palpable  bilaterally.  Capillary return is within normal limits  bilaterally. Temperature is within normal limits  bilaterally.  Neurologic  Senn-Weinstein monofilament wire test diminished   bilaterally. Muscle power within normal limits bilaterally.  Nails Thick disfigured discolored nails with subungual debris  from hallux to fifth toes bilaterally. No evidence of bacterial infection or drainage bilaterally.  Orthopedic  No limitations of motion  feet .  No crepitus or effusions noted.  No bony pathology or digital deformities noted. 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  Skin  normotropic skin  noted bilaterally.  No signs of infections or ulcers noted.   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  Callus left hallux.  Callus midfoot right    Porokeratosis B/L.  Onychomycosis  Consent was obtained for treatment procedures.   Debride callus with # 15 blade. Followed by dremel tool usage.  No infection or ulcer.  Patient dispensed diabetic shoes.   Return office visit   3 months                  Told patient to return for periodic foot care and evaluation due to potential at risk complications.     Windsor Goeken DPM  

## 2021-11-16 NOTE — Progress Notes (Signed)
Patient presents today to pick up custom shoed.   Shoes were dispensed and fit was satisfactory. Reviewed instructions for break-in and wear. Written instructions given to patient.  Patient will follow up as needed.  order # [id number]

## 2021-11-23 ENCOUNTER — Ambulatory Visit: Payer: Medicaid Other | Admitting: Podiatry

## 2021-12-01 DIAGNOSIS — E785 Hyperlipidemia, unspecified: Secondary | ICD-10-CM | POA: Diagnosis not present

## 2021-12-01 DIAGNOSIS — Z125 Encounter for screening for malignant neoplasm of prostate: Secondary | ICD-10-CM | POA: Diagnosis not present

## 2021-12-01 DIAGNOSIS — E559 Vitamin D deficiency, unspecified: Secondary | ICD-10-CM | POA: Diagnosis not present

## 2021-12-01 DIAGNOSIS — E1169 Type 2 diabetes mellitus with other specified complication: Secondary | ICD-10-CM | POA: Diagnosis not present

## 2021-12-01 DIAGNOSIS — R7989 Other specified abnormal findings of blood chemistry: Secondary | ICD-10-CM | POA: Diagnosis not present

## 2021-12-01 DIAGNOSIS — I10 Essential (primary) hypertension: Secondary | ICD-10-CM | POA: Diagnosis not present

## 2021-12-23 DIAGNOSIS — N138 Other obstructive and reflux uropathy: Secondary | ICD-10-CM | POA: Diagnosis not present

## 2021-12-23 DIAGNOSIS — N312 Flaccid neuropathic bladder, not elsewhere classified: Secondary | ICD-10-CM | POA: Diagnosis not present

## 2021-12-23 DIAGNOSIS — N401 Enlarged prostate with lower urinary tract symptoms: Secondary | ICD-10-CM | POA: Diagnosis not present

## 2021-12-23 DIAGNOSIS — R339 Retention of urine, unspecified: Secondary | ICD-10-CM | POA: Diagnosis not present

## 2021-12-24 DIAGNOSIS — F79 Unspecified intellectual disabilities: Secondary | ICD-10-CM | POA: Diagnosis not present

## 2021-12-24 DIAGNOSIS — Z87891 Personal history of nicotine dependence: Secondary | ICD-10-CM | POA: Diagnosis not present

## 2021-12-24 DIAGNOSIS — I10 Essential (primary) hypertension: Secondary | ICD-10-CM | POA: Diagnosis not present

## 2021-12-24 DIAGNOSIS — C329 Malignant neoplasm of larynx, unspecified: Secondary | ICD-10-CM | POA: Diagnosis not present

## 2022-01-11 DIAGNOSIS — Z23 Encounter for immunization: Secondary | ICD-10-CM | POA: Diagnosis not present

## 2022-02-01 ENCOUNTER — Encounter: Payer: Self-pay | Admitting: Podiatry

## 2022-02-01 ENCOUNTER — Ambulatory Visit (INDEPENDENT_AMBULATORY_CARE_PROVIDER_SITE_OTHER): Payer: Medicare HMO | Admitting: Podiatry

## 2022-02-01 DIAGNOSIS — M79676 Pain in unspecified toe(s): Secondary | ICD-10-CM

## 2022-02-01 DIAGNOSIS — L84 Corns and callosities: Secondary | ICD-10-CM | POA: Diagnosis not present

## 2022-02-01 DIAGNOSIS — B351 Tinea unguium: Secondary | ICD-10-CM

## 2022-02-01 DIAGNOSIS — E0851 Diabetes mellitus due to underlying condition with diabetic peripheral angiopathy without gangrene: Secondary | ICD-10-CM

## 2022-02-01 DIAGNOSIS — M129 Arthropathy, unspecified: Secondary | ICD-10-CM

## 2022-02-01 NOTE — Progress Notes (Signed)
This patient returns to my office for at risk foot care.  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  This patient is unable to cut nails himself since the patient cannot reach his nails.These nails are painful walking and wearing shoes. Patient also has painful callus  on both feet since he is unable to self treat.   This patient presents for at risk foot care today.  General Appearance  Alert, conversant and in no acute stress.  Vascular  Dorsalis pedis and posterior tibial  pulses are not  palpable  bilaterally.  Capillary return is within normal limits  bilaterally. Temperature is within normal limits  bilaterally.  Neurologic  Senn-Weinstein monofilament wire test diminished   bilaterally. Muscle power within normal limits bilaterally.  Nails Thick disfigured discolored nails with subungual debris  from hallux to fifth toes bilaterally. No evidence of bacterial infection or drainage bilaterally.  Orthopedic  No limitations of motion  feet .  No crepitus or effusions noted.  No bony pathology or digital deformities noted. 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  Skin  normotropic skin  noted bilaterally.  No signs of infections or ulcers noted.   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  Callus left hallux.  Callus midfoot right    Porokeratosis B/L.  Onychomycosis  Consent was obtained for treatment procedures.   Debride callus with # 15 blade. Followed by dremel tool usage.  No infection or ulcer.  Patient dispensed diabetic shoes.   Return office visit   3 months                  Told patient to return for periodic foot care and evaluation due to potential at risk complications.     Keith Cancio DPM  

## 2022-02-11 DIAGNOSIS — R82998 Other abnormal findings in urine: Secondary | ICD-10-CM | POA: Diagnosis not present

## 2022-02-11 DIAGNOSIS — E785 Hyperlipidemia, unspecified: Secondary | ICD-10-CM | POA: Diagnosis not present

## 2022-02-11 DIAGNOSIS — C329 Malignant neoplasm of larynx, unspecified: Secondary | ICD-10-CM | POA: Diagnosis not present

## 2022-02-11 DIAGNOSIS — Z0000 Encounter for general adult medical examination without abnormal findings: Secondary | ICD-10-CM | POA: Diagnosis not present

## 2022-02-11 DIAGNOSIS — N401 Enlarged prostate with lower urinary tract symptoms: Secondary | ICD-10-CM | POA: Diagnosis not present

## 2022-02-11 DIAGNOSIS — M25571 Pain in right ankle and joints of right foot: Secondary | ICD-10-CM | POA: Diagnosis not present

## 2022-02-11 DIAGNOSIS — I10 Essential (primary) hypertension: Secondary | ICD-10-CM | POA: Diagnosis not present

## 2022-02-11 DIAGNOSIS — F330 Major depressive disorder, recurrent, mild: Secondary | ICD-10-CM | POA: Diagnosis not present

## 2022-02-11 DIAGNOSIS — E1169 Type 2 diabetes mellitus with other specified complication: Secondary | ICD-10-CM | POA: Diagnosis not present

## 2022-03-15 NOTE — Progress Notes (Unsigned)
Office Visit Note  Patient: Arthur Weaver             Date of Birth: [birth year]           MRN: [id number]             PCP: Velna Hatchet, MD Referring: Velna Hatchet, MD Visit Date: 03/28/2022 Occupation: @GUAROCC$ @  Subjective:  Switched to meloxicam   History of Present Illness: Arthur Weaver is a 62 y.o. male with history of osteoarthritis, DDD, and psoriasis.  As of the beginning of January 2024 he was switched from diclofenac gel to oral meloxicam 7.5 mg daily for pain relief prescribed by his PCP.  The facility where he lives misunderstood and had continued to use Voltaren gel in combination with meloxicam up until about 2 weeks ago.  He has not yet had lab work since initiating meloxicam.  His next appointment with his PCP is not until May 2024.  Patient reports that his left elbow joint pain improved after having a cortisone injection on 09/21/2021.  He continues to experience discomfort in his knee joints especially when rising from a seated position.  He denies any joint swelling.  He is planning on following up at [facility] to discuss repeating Visco gel injections in the future. He has some red scaly lesions on his forearms concerning for psoriasis.  He has not been using triamcinolone cream recently but has a jar of it at the facility which can be used as needed.     Activities of Daily Living:  Patient reports morning stiffness for 1-2 hours.   Patient Reports nocturnal pain.  Difficulty dressing/grooming: Denies Difficulty climbing stairs: Reports Difficulty getting out of chair: Denies Difficulty using hands for taps, buttons, cutlery, and/or writing: Denies  Review of Systems  Constitutional:  Negative for fatigue.  HENT:  Negative for mouth sores and mouth dryness.   Eyes:  Positive for dryness.  Respiratory:  Negative for shortness of breath.   Cardiovascular:  Negative for chest pain and palpitations.  Gastrointestinal:  Negative for blood in  stool, constipation and diarrhea.  Endocrine: Negative for increased urination.  Genitourinary:  Negative for involuntary urination.  Musculoskeletal:  Positive for joint pain, joint pain, muscle weakness, morning stiffness and muscle tenderness. Negative for gait problem, joint swelling, myalgias and myalgias.  Skin:  Positive for hair loss. Negative for color change and sensitivity to sunlight.  Allergic/Immunologic: Negative for susceptible to infections.  Neurological:  Negative for dizziness and headaches.  Hematological:  Negative for swollen glands.  Psychiatric/Behavioral:  Negative for depressed mood and sleep disturbance. The patient is not nervous/anxious.     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   Retention of urine 10/23/2017   Benign prostate hyperplasia 10/22/2017   Urinary urgency 10/22/2017   Acquired hammer toes of both feet 05/12/2017   Callosity 04/07/2017   Elevated levels of transaminase & lactic acid dehydrogenase 09/15/2016   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   Tendonitis 09/25/2015   Encounter for general adult medical examination without abnormal findings 07/30/2014   Gastro-esophageal reflux disease without esophagitis 05/28/2013  Vitamin D deficiency 05/28/2013   High blood pressure 09/07/2010   High cholesterol 09/07/2010   Bilateral swelling of feet 09/07/2010   Diabetes mellitus 09/07/2010   Rheumatoid arthritis(714.0) 09/07/2010   Personal history of colonic polyps 10/27/2004    Past Medical History:  Diagnosis Date   Anxiety     At risk for sleep apnea    STOP-BANG= 5       SENT TO PCP 04-14-2016   BPH (benign prostatic hyperplasia)    DDD (degenerative disc disease), cervical    DDD (degenerative disc disease), lumbar    Diverticulosis of colon    Family history of factor V deficiency    per pt's sister (whom is pt's guardian) she, pt's brother and parents have factor V but pt has never been tested   Frequency of urination    GERD (gastroesophageal reflux disease)    History of adenomatous polyp of colon    2006 and 03-18-2010 tubular adenoma   Hyperlipidemia    Hypertension    Lives in independent group home resides [facility] in [place], [place]   pt independant w/ ADLs w/ exception does not cook for shop    Mentally challenged    SPECIAL NEEDS   OA (osteoarthritis)    hands, knees, feet   Pre-diabetes    Psoriasis    per patient's sister   Thoracic spondylosis    Urgency of urination    Wears partial dentures    lower    Family History  Problem Relation Age of Onset   Breast cancer Mother    Brain cancer Mother    Hypertension Mother    Diabetes Mother    Crohn's disease Father    Rheum arthritis Father    GER disease Father    Ulcers Father    Cataracts Sister    Fibromyalgia Sister    COPD Sister    Breast cancer Sister    GER disease Sister    Hypothyroidism Sister    Osteoarthritis Sister    GER disease Brother    GER disease Brother    Stroke Brother    Liver cancer Brother    Heart attack Brother    Colon cancer Neg Hx    Past Surgical History:  Procedure Laterality Date   CATARACT EXTRACTION W/ INTRAOCULAR LENS  IMPLANT, BILATERAL  2006   CIRCUMCISION  03/29/2007   W/  CYSTOSCOPY AND TRANSRECTAL ULTRASOUND GUIDED PROSTATE BX'S   COLONOSCOPY  last one 08-26-2015   MICROLARYNGOSCOPY  04/20/2020   Procedure: MICROLARYNGOSCOPY WITH BIOPSY;  Surgeon: Jason Coop, DO;  Location: [facility] OR;  Service: ENT;;   THULIUM LASER TURP (TRANSURETHRAL RESECTION OF  PROSTATE) N/A 04/21/2016   Procedure: THULIUM LASER TURP (TRANSURETHRAL RESECTION OF PROSTATE);  Surgeon: Carolan Clines, MD;  Location[facility];  Service: Urology;  Laterality: N/A;   Social History   Social History Narrative   Not on file   Immunization History  Administered Date(s) Administered   Influenza Split 11/07/2012, 11/06/2013   Influenza, Quadrivalent, Recombinant, Inj, Pf 02/21/2018, 10/25/2018, 11/11/2019   Influenza,inj,Quad PF,6+ Mos 11/06/2013, 11/07/2014   Moderna Sars-Covid-2 Vaccination 02/18/2019, 03/18/2019, 03/27/2020   Tdap 02/08/2011     Objective: Vital Signs: BP (!) 145/83 (BP Location: Right Arm, Patient Position: Sitting, Cuff Size: Normal)   Pulse 80   Resp 14   Wt 228 lb (103.4 kg)   BMI 33.67 kg/m    Physical Exam Vitals and nursing note reviewed.  Constitutional:      Appearance: He is well-developed.  HENT:     Head: Normocephalic and atraumatic.  Eyes:     Conjunctiva/sclera: Conjunctivae normal.     Pupils: Pupils are equal, round, and reactive to light.  Cardiovascular:     Rate and Rhythm: Normal rate and regular rhythm.     Heart sounds: Normal heart sounds.  Pulmonary:     Effort: Pulmonary effort is normal.     Breath sounds: Normal breath sounds.  Abdominal:     General: Bowel sounds are normal.     Palpations: Abdomen is soft.  Musculoskeletal:     Cervical back: Normal range of motion and neck supple.  Skin:    General: Skin is warm and dry.     Capillary Refill: Capillary refill takes less than 2 seconds.  Neurological:     Mental Status: He is alert and oriented to person, place, and time.  Psychiatric:        Behavior: Behavior normal.      Musculoskeletal Exam: C-spine has good range of motion.  Thoracic kyphosis noted.  Some discomfort in the lumbar spine.  Shoulder joints, elbow joints, wrist joints, MCPs, PIPs, DIPs have good range of motion with no synovitis.  PIP and DIP thickening  consistent with osteoarthritis of both hands.  Complete fist formation bilaterally.  Some CMC joint prominence noted bilaterally.  Hip joints have good range of motion with no groin pain.  Discomfort with range of motion of both knee joints.  No warmth or effusion of knee joints noted.  Some pitting edema noted bilaterally.  CDAI Exam: CDAI Score: -- Patient Global: --; Provider Global: -- Swollen: --; Tender: -- Joint Exam 03/28/2022   No joint exam has been documented for this visit   There is currently no information documented on the homunculus. Go to the Rheumatology activity and complete the homunculus joint exam.  Investigation: No additional findings.  Imaging: No results found.  Recent Labs: Lab Results  Component Value Date   WBC 9.0 04/20/2020   HGB 13.6 04/20/2020   PLT 317 04/20/2020   NA 138 04/20/2020   K 3.7 04/20/2020   CL 102 04/20/2020   CO2 29 04/20/2020   GLUCOSE 110 (H) 04/20/2020   BUN 16 04/20/2020   CREATININE 0.74 04/20/2020   CALCIUM 8.7 (L) 04/20/2020   GFRAA >60 10/08/2016    Speciality Comments: No specialty comments available.  Procedures:  No procedures performed Allergies: Penicillins, Bacitracin, Imidurea, Methylisothiazolinone, Neomycin, Polymyxin b, Pork-derived products, Sulfa antibiotics, and Urea  Assessment / Plan:     Visit Diagnoses: Primary osteoarthritis of both hands: He has PIP and DIP thickening consistent with osteoarthritis of both hands.  CMC joint prominence noted bilaterally.  Discussed the importance of joint protection and muscle strengthening.  He is no longer using Voltaren gel topically and has switched to meloxicam 7.5 mg daily.  No synovitis was noted today.  Lateral epicondylitis, left elbow: Improved.  Patient had a left lateral epicondyle cortisone injection performed on 09/21/2021.  Primary osteoarthritis of both knees - He underwent Visco gel injections in both knees in fall 2022 performed at [facility].  He has been experiencing a recurrence of discomfort in both knees.  He plans on having repeat Visco gel injections performed at [facility] since it has been cheaper there in the past.  On examination today he had no warmth or effusion.  He can continue his normal activities.  Discussed the importance  of lower extremity muscle strengthening.  He plans on continuing to take meloxicam 7.5 mg 1 tablet daily as prescribed by his PCP for pain relief.  Primary osteoarthritis of both feet: Followed by his podiatrist Dr. Prudence Davidson.   DDD (degenerative disc disease), thoracic - Thoracic kyphosis. No midline spinal tenderness in the thoracic region at this time.   DDD (degenerative disc disease), lumbar: He continues to experience intermittent discomfort and stiffness in his lower back.  He plans on continuing to take meloxicam 7.5 mg 1 tablet daily for pain relief.  Psoriasis: He has a few small scattered patches of psoriasis on the dorsal aspect of both forearms.  He has a prescription for triamcinolone 0.1% cream which she can apply topically once to twice daily up to 2 weeks until the patch is resolved.  Medication monitoring encounter -Recommend updating lab work since he initiated meloxicam 7.5 mg daily at the beginning of January 2024.  He plans on having lab work drawn today at [facility]  Orders were provided.  Plan: CBC with Differential/Platelet, COMPLETE METABOLIC PANEL WITH GFR  Other medical conditions are listed as follows:  Leg length discrepancy  History of hypercholesterolemia  History of hypertension: Blood pressure was 145/83 today in the office.  His blood pressure was rechecked prior to him leaving.  History of diabetes mellitus   Orders: Orders Placed This Encounter  Procedures   CBC with Differential/Platelet   COMPLETE METABOLIC PANEL WITH GFR   No orders of the defined types were placed in this encounter.    Follow-Up Instructions: Return in 6 months (on  09/26/2022) for Psoriasis, Osteoarthritis, DDD.   Ofilia Neas, PA-C  Note - This record has been created using Dragon software.  Chart creation errors have been sought, but may not always  have been located. Such creation errors do not reflect on  the standard of medical care.

## 2022-03-28 ENCOUNTER — Ambulatory Visit: Payer: Medicare HMO | Attending: Physician Assistant | Admitting: Physician Assistant

## 2022-03-28 ENCOUNTER — Encounter: Payer: Self-pay | Admitting: Physician Assistant

## 2022-03-28 VITALS — BP 145/83 | HR 80 | Resp 14 | Wt 228.0 lb

## 2022-03-28 DIAGNOSIS — Z8639 Personal history of other endocrine, nutritional and metabolic disease: Secondary | ICD-10-CM | POA: Diagnosis not present

## 2022-03-28 DIAGNOSIS — M19042 Primary osteoarthritis, left hand: Secondary | ICD-10-CM

## 2022-03-28 DIAGNOSIS — Z5181 Encounter for therapeutic drug level monitoring: Secondary | ICD-10-CM | POA: Diagnosis not present

## 2022-03-28 DIAGNOSIS — M5136 Other intervertebral disc degeneration, lumbar region: Secondary | ICD-10-CM

## 2022-03-28 DIAGNOSIS — M2170 Unequal limb length (acquired), unspecified site: Secondary | ICD-10-CM | POA: Diagnosis not present

## 2022-03-28 DIAGNOSIS — M19071 Primary osteoarthritis, right ankle and foot: Secondary | ICD-10-CM

## 2022-03-28 DIAGNOSIS — M170 Bilateral primary osteoarthritis of knee: Secondary | ICD-10-CM

## 2022-03-28 DIAGNOSIS — M7712 Lateral epicondylitis, left elbow: Secondary | ICD-10-CM | POA: Diagnosis not present

## 2022-03-28 DIAGNOSIS — M5134 Other intervertebral disc degeneration, thoracic region: Secondary | ICD-10-CM | POA: Diagnosis not present

## 2022-03-28 DIAGNOSIS — Z8679 Personal history of other diseases of the circulatory system: Secondary | ICD-10-CM

## 2022-03-28 DIAGNOSIS — M19072 Primary osteoarthritis, left ankle and foot: Secondary | ICD-10-CM

## 2022-03-28 DIAGNOSIS — L409 Psoriasis, unspecified: Secondary | ICD-10-CM

## 2022-03-28 DIAGNOSIS — M19041 Primary osteoarthritis, right hand: Secondary | ICD-10-CM

## 2022-03-29 LAB — CBC WITH DIFFERENTIAL/PLATELET
Absolute Monocytes: 970 cells/uL — ABNORMAL HIGH (ref 200–950)
Basophils Absolute: 69 cells/uL (ref 0–200)
Basophils Relative: 0.7 %
Eosinophils Absolute: 356 cells/uL (ref 15–500)
Eosinophils Relative: 3.6 %
HCT: 40.3 % (ref 38.5–50.0)
Hemoglobin: 14.1 g/dL (ref 13.2–17.1)
Lymphs Abs: 3416 cells/uL (ref 850–3900)
MCH: 30.5 pg (ref 27.0–33.0)
MCHC: 35 g/dL (ref 32.0–36.0)
MCV: 87.2 fL (ref 80.0–100.0)
MPV: 10.2 fL (ref 7.5–12.5)
Monocytes Relative: 9.8 %
Neutro Abs: 5089 cells/uL (ref 1500–7800)
Neutrophils Relative %: 51.4 %
Platelets: 322 10*3/uL (ref 140–400)
RBC: 4.62 10*6/uL (ref 4.20–5.80)
RDW: 12.6 % (ref 11.0–15.0)
Total Lymphocyte: 34.5 %
WBC: 9.9 10*3/uL (ref 3.8–10.8)

## 2022-03-29 LAB — COMPLETE METABOLIC PANEL WITH GFR
AG Ratio: 1.5 (calc) (ref 1.0–2.5)
ALT: 22 U/L (ref 9–46)
AST: 20 U/L (ref 10–35)
Albumin: 4.3 g/dL (ref 3.6–5.1)
Alkaline phosphatase (APISO): 52 U/L (ref 35–144)
BUN: 18 mg/dL (ref 7–25)
CO2: 28 mmol/L (ref 20–32)
Calcium: 9.3 mg/dL (ref 8.6–10.3)
Chloride: 103 mmol/L (ref 98–110)
Creat: 0.72 mg/dL (ref 0.70–1.35)
Globulin: 2.8 g/dL (calc) (ref 1.9–3.7)
Glucose, Bld: 70 mg/dL (ref 65–99)
Potassium: 4 mmol/L (ref 3.5–5.3)
Sodium: 139 mmol/L (ref 135–146)
Total Bilirubin: 0.4 mg/dL (ref 0.2–1.2)
Total Protein: 7.1 g/dL (ref 6.1–8.1)
eGFR: 103 mL/min/{1.73_m2} (ref >=60)

## 2022-03-29 NOTE — Progress Notes (Signed)
CMP WNL--kidney function is stable.  Absolute monocytes are elevated. Rest of CBC WNL.

## 2022-04-12 ENCOUNTER — Encounter: Payer: Self-pay | Admitting: Podiatry

## 2022-04-12 ENCOUNTER — Ambulatory Visit (INDEPENDENT_AMBULATORY_CARE_PROVIDER_SITE_OTHER): Payer: Medicare HMO | Admitting: Podiatry

## 2022-04-12 ENCOUNTER — Ambulatory Visit: Payer: Medicare HMO | Admitting: Podiatry

## 2022-04-12 DIAGNOSIS — M129 Arthropathy, unspecified: Secondary | ICD-10-CM

## 2022-04-12 DIAGNOSIS — B351 Tinea unguium: Secondary | ICD-10-CM

## 2022-04-12 DIAGNOSIS — E0851 Diabetes mellitus due to underlying condition with diabetic peripheral angiopathy without gangrene: Secondary | ICD-10-CM

## 2022-04-12 DIAGNOSIS — M79676 Pain in unspecified toe(s): Secondary | ICD-10-CM

## 2022-04-12 DIAGNOSIS — L84 Corns and callosities: Secondary | ICD-10-CM

## 2022-04-12 NOTE — Progress Notes (Signed)
This patient returns to my office for at risk foot care.  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  This patient is unable to cut nails himself since the patient cannot reach his nails.These nails are painful walking and wearing shoes. Patient also has painful callus  on both feet since he is unable to self treat.   This patient presents for at risk foot care today.  General Appearance  Alert, conversant and in no acute stress.  Vascular  Dorsalis pedis and posterior tibial  pulses are not  palpable  bilaterally.  Capillary return is within normal limits  bilaterally. Temperature is within normal limits  bilaterally.  Neurologic  Senn-Weinstein monofilament wire test diminished   bilaterally. Muscle power within normal limits bilaterally.  Nails Thick disfigured discolored nails with subungual debris  from hallux to fifth toes bilaterally. No evidence of bacterial infection or drainage bilaterally.  Orthopedic  No limitations of motion  feet .  No crepitus or effusions noted.  No bony pathology or digital deformities noted. 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  Skin  normotropic skin  noted bilaterally.  No signs of infections or ulcers noted.   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  Callus left hallux.  Callus midfoot right    Porokeratosis B/L.  Onychomycosis  Consent was obtained for treatment procedures.   Debride callus with # 15 blade. Followed by dremel tool usage.  No infection or ulcer.  Patient dispensed diabetic shoes.   Return office visit   3 months                  Told patient to return for periodic foot care and evaluation due to potential at risk complications.     Gardiner Barefoot DPM

## 2022-04-13 DIAGNOSIS — M170 Bilateral primary osteoarthritis of knee: Secondary | ICD-10-CM | POA: Diagnosis not present

## 2022-04-19 ENCOUNTER — Telehealth: Payer: Self-pay | Admitting: Internal Medicine

## 2022-04-19 NOTE — Telephone Encounter (Signed)
Change colonoscopy recall to 09/2022  Patient due 08/2022 but sister [facility]) unable to bring until Fall 2024 due to work schedule

## 2022-04-21 DIAGNOSIS — M170 Bilateral primary osteoarthritis of knee: Secondary | ICD-10-CM | POA: Diagnosis not present

## 2022-04-27 DIAGNOSIS — M170 Bilateral primary osteoarthritis of knee: Secondary | ICD-10-CM | POA: Diagnosis not present

## 2022-05-23 DIAGNOSIS — C320 Malignant neoplasm of glottis: Secondary | ICD-10-CM | POA: Diagnosis not present

## 2022-05-23 DIAGNOSIS — R0981 Nasal congestion: Secondary | ICD-10-CM | POA: Diagnosis not present

## 2022-05-23 DIAGNOSIS — K219 Gastro-esophageal reflux disease without esophagitis: Secondary | ICD-10-CM | POA: Diagnosis not present

## 2022-06-13 DIAGNOSIS — E785 Hyperlipidemia, unspecified: Secondary | ICD-10-CM | POA: Diagnosis not present

## 2022-06-13 DIAGNOSIS — I10 Essential (primary) hypertension: Secondary | ICD-10-CM | POA: Diagnosis not present

## 2022-06-13 DIAGNOSIS — E1169 Type 2 diabetes mellitus with other specified complication: Secondary | ICD-10-CM | POA: Diagnosis not present

## 2022-06-13 DIAGNOSIS — N401 Enlarged prostate with lower urinary tract symptoms: Secondary | ICD-10-CM | POA: Diagnosis not present

## 2022-06-13 DIAGNOSIS — L989 Disorder of the skin and subcutaneous tissue, unspecified: Secondary | ICD-10-CM | POA: Diagnosis not present

## 2022-06-13 DIAGNOSIS — M79671 Pain in right foot: Secondary | ICD-10-CM | POA: Diagnosis not present

## 2022-06-13 DIAGNOSIS — F330 Major depressive disorder, recurrent, mild: Secondary | ICD-10-CM | POA: Diagnosis not present

## 2022-06-13 DIAGNOSIS — M25571 Pain in right ankle and joints of right foot: Secondary | ICD-10-CM | POA: Diagnosis not present

## 2022-06-20 DIAGNOSIS — L720 Epidermal cyst: Secondary | ICD-10-CM | POA: Diagnosis not present

## 2022-06-20 DIAGNOSIS — D224 Melanocytic nevi of scalp and neck: Secondary | ICD-10-CM | POA: Diagnosis not present

## 2022-06-20 DIAGNOSIS — L814 Other melanin hyperpigmentation: Secondary | ICD-10-CM | POA: Diagnosis not present

## 2022-06-20 DIAGNOSIS — L570 Actinic keratosis: Secondary | ICD-10-CM | POA: Diagnosis not present

## 2022-06-20 DIAGNOSIS — L821 Other seborrheic keratosis: Secondary | ICD-10-CM | POA: Diagnosis not present

## 2022-07-18 ENCOUNTER — Ambulatory Visit (INDEPENDENT_AMBULATORY_CARE_PROVIDER_SITE_OTHER): Payer: Medicare HMO | Admitting: Podiatry

## 2022-07-18 ENCOUNTER — Encounter: Payer: Self-pay | Admitting: Podiatry

## 2022-07-18 DIAGNOSIS — L84 Corns and callosities: Secondary | ICD-10-CM | POA: Diagnosis not present

## 2022-07-18 DIAGNOSIS — M79676 Pain in unspecified toe(s): Secondary | ICD-10-CM

## 2022-07-18 DIAGNOSIS — M2010 Hallux valgus (acquired), unspecified foot: Secondary | ICD-10-CM | POA: Diagnosis not present

## 2022-07-18 DIAGNOSIS — B351 Tinea unguium: Secondary | ICD-10-CM | POA: Diagnosis not present

## 2022-07-18 DIAGNOSIS — E0851 Diabetes mellitus due to underlying condition with diabetic peripheral angiopathy without gangrene: Secondary | ICD-10-CM

## 2022-07-18 NOTE — Addendum Note (Signed)
Addended by: Helane Gunther on: 07/18/2022 04:39 PM   Modules accepted: Orders

## 2022-07-18 NOTE — Progress Notes (Signed)
This patient returns to my office for at risk foot care.  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  This patient is unable to cut nails himself since the patient cannot reach his nails.These nails are painful walking and wearing shoes. Patient also has painful callus  on both feet since he is unable to self treat.   This patient presents for at risk foot care today.  General Appearance  Alert, conversant and in no acute stress.  Vascular  Dorsalis pedis and posterior tibial  pulses are not  palpable  bilaterally.  Capillary return is within normal limits  bilaterally. Temperature is within normal limits  bilaterally.  Neurologic  Senn-Weinstein monofilament wire test diminished   bilaterally. Muscle power within normal limits bilaterally.  Nails Thick disfigured discolored nails with subungual debris  from hallux to fifth toes bilaterally. No evidence of bacterial infection or drainage bilaterally.  Orthopedic  No limitations of motion  feet .  No crepitus or effusions noted.  No bony pathology or digital deformities noted. 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  Skin  normotropic skin  noted bilaterally.  No signs of infections or ulcers noted.   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  Callus left hallux.  Callus midfoot right    Porokeratosis B/L.  Onychomycosis  Consent was obtained for treatment procedures.   Debride callus with # 15 blade followed by dremel tool. Followed by dremel tool usage.  No infection or ulcer.  Patient discussed footwear with Carney Bern.   Return office visit   3 months                  Told patient to return for periodic foot care and evaluation due to potential at risk complications.     Helane Gunther DPM

## 2022-09-30 ENCOUNTER — Encounter: Payer: Self-pay | Admitting: Podiatry

## 2022-09-30 ENCOUNTER — Ambulatory Visit (INDEPENDENT_AMBULATORY_CARE_PROVIDER_SITE_OTHER): Payer: Medicare HMO | Admitting: Podiatry

## 2022-09-30 DIAGNOSIS — M129 Arthropathy, unspecified: Secondary | ICD-10-CM

## 2022-09-30 DIAGNOSIS — B351 Tinea unguium: Secondary | ICD-10-CM

## 2022-09-30 DIAGNOSIS — M79676 Pain in unspecified toe(s): Secondary | ICD-10-CM | POA: Diagnosis not present

## 2022-09-30 DIAGNOSIS — E0851 Diabetes mellitus due to underlying condition with diabetic peripheral angiopathy without gangrene: Secondary | ICD-10-CM

## 2022-09-30 DIAGNOSIS — L84 Corns and callosities: Secondary | ICD-10-CM

## 2022-09-30 NOTE — Progress Notes (Signed)
This patient returns to my office for at risk foot care.  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  This patient is unable to cut nails himself since the patient cannot reach his nails.These nails are painful walking and wearing shoes. Patient also has painful callus  on both feet since he is unable to self treat.   This patient presents for at risk foot care today.  General Appearance  Alert, conversant and in no acute stress.  Vascular  Dorsalis pedis and posterior tibial  pulses are not  palpable  bilaterally.  Capillary return is within normal limits  bilaterally. Temperature is within normal limits  bilaterally.  Neurologic  Senn-Weinstein monofilament wire test diminished   bilaterally. Muscle power within normal limits bilaterally.  Nails Thick disfigured discolored nails with subungual debris  from hallux to fifth toes bilaterally. No evidence of bacterial infection or drainage bilaterally.  Orthopedic  No limitations of motion  feet .  No crepitus or effusions noted.  No bony pathology or digital deformities noted. 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  Skin  normotropic skin  noted bilaterally.  No signs of infections or ulcers noted.   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  Callus left hallux.  Callus midfoot right    Porokeratosis B/L.  Onychomycosis  Consent was obtained for treatment procedures.   Debride callus with # 15 blade followed by dremel tool. Followed by dremel tool usage.  No infection or ulcer.  Patient discussed footwear with Carney Bern.   Return office visit   10 weeks                  Told patient to return for periodic foot care and evaluation due to potential at risk complications.     Helane Gunther DPM

## 2022-10-03 DIAGNOSIS — Z8521 Personal history of malignant neoplasm of larynx: Secondary | ICD-10-CM | POA: Diagnosis not present

## 2022-10-03 DIAGNOSIS — K219 Gastro-esophageal reflux disease without esophagitis: Secondary | ICD-10-CM | POA: Diagnosis not present

## 2022-10-03 DIAGNOSIS — R0981 Nasal congestion: Secondary | ICD-10-CM | POA: Diagnosis not present

## 2022-10-03 DIAGNOSIS — C320 Malignant neoplasm of glottis: Secondary | ICD-10-CM | POA: Diagnosis not present

## 2022-10-11 NOTE — Progress Notes (Signed)
Office Visit Note  Patient: Arthur Weaver             Date of Birth: 10-Feb-1960           MRN: [id number]             PCP: Alysia Penna, MD Referring: Alysia Penna, MD Visit Date: 10/24/2022 Occupation: @GUAROCC @  Subjective:  Feet pain   History of Present Illness: Arthur Weaver is a 62 y.o. male with osteoarthritis, degenerative disc disease and psoriasis.  He was accompanied by his sister today.  His dose of meloxicam was increased from 7.5 mg to 50 mg p.o. daily by his PCP due to ongoing discomfort.  Patient has noticed improvement on increased dose of meloxicam.  Continues to have some discomfort in his right foot where he had callus.  He will be getting new shoes.  He states he is not very active and does not walk much.  He denies discomfort in his hands knees andhis feet.  The lower back pain is not bothersome.  The left elbow tendinitis is resolved.  He continues to get viscosupplement injections through the orthopedics.  He also has an appointment coming up with the neurologist for memory issues.    Activities of Daily Living:  Patient reports morning stiffness for 20 minutes.   Patient Reports nocturnal pain.  Difficulty dressing/grooming: Denies Difficulty climbing stairs: Reports Difficulty getting out of chair: Reports Difficulty using hands for taps, buttons, cutlery, and/or writing: Reports  Review of Systems  Constitutional:  Positive for fatigue.  HENT:  Positive for mouth dryness. Negative for mouth sores.   Eyes:  Positive for dryness.  Respiratory:  Negative for shortness of breath.   Cardiovascular:  Negative for chest pain and palpitations.  Gastrointestinal:  Negative for blood in stool, constipation and diarrhea.  Endocrine: Positive for increased urination.  Genitourinary:  Positive for involuntary urination.  Musculoskeletal:  Positive for joint pain, gait problem, joint pain, myalgias, muscle weakness, morning stiffness and myalgias. Negative  for joint swelling and muscle tenderness.  Skin:  Positive for sensitivity to sunlight. Negative for color change, rash and hair loss.  Allergic/Immunologic: Negative for susceptible to infections.  Neurological:  Negative for dizziness and headaches.  Hematological:  Negative for swollen glands.  Psychiatric/Behavioral:  Positive for depressed mood and sleep disturbance. The patient is nervous/anxious.     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   Retention of urine 10/23/2017   Benign prostate hyperplasia 10/22/2017   Urinary urgency 10/22/2017   Acquired hammer toes of both feet 05/12/2017   Callosity 04/07/2017   Elevated levels of transaminase & lactic acid dehydrogenase 09/15/2016   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   Tendonitis 09/25/2015   Encounter for general adult medical examination without abnormal findings 07/30/2014   Gastro-esophageal reflux disease without esophagitis 05/28/2013   Vitamin D deficiency 05/28/2013   High blood pressure 09/07/2010   High cholesterol 09/07/2010   Bilateral swelling of feet 09/07/2010   Diabetes mellitus 09/07/2010   Rheumatoid arthritis(714.0) 09/07/2010   Personal history of colonic polyps 10/27/2004    Past Medical  History:  Diagnosis Date   Anxiety    At risk for sleep apnea    STOP-BANG= 5       SENT TO PCP 04-14-2016   BPH (benign prostatic hyperplasia)    DDD (degenerative disc disease), cervical    DDD (degenerative disc disease), lumbar    Diverticulosis of colon    Family history of factor V deficiency     per pt's sister (whom is pt's guardian) she, pt's brother and parents have factor V but pt has never been tested   Frequency of urination    GERD (gastroesophageal reflux disease)    History of adenomatous polyp of colon    2006 and 03-18-2010 tubular adenoma   Hyperlipidemia    Hypertension    Lives in independent group home resides at [facility] in [place], [place]   pt independant w/ ADLs w/ exception does not cook for shop    Mentally challenged    SPECIAL NEEDS   OA (osteoarthritis)    hands, knees, feet   Pre-diabetes    Psoriasis    per patient's sister   Thoracic spondylosis    Urgency of urination    Wears partial dentures    lower    Family History  Problem Relation Age of Onset   Breast cancer Mother    Brain cancer Mother    Hypertension Mother    Diabetes Mother    Crohn's disease Father    Rheum arthritis Father    GER disease Father    Ulcers Father    Cataracts Sister    Fibromyalgia Sister    COPD Sister    Breast cancer Sister    GER disease Sister    Hypothyroidism Sister    Osteoarthritis Sister    GER disease Brother    GER disease Brother    Stroke Brother    Liver cancer Brother    Heart attack Brother    Colon cancer Neg Hx    Past Surgical History:  Procedure Laterality Date   CATARACT EXTRACTION W/ INTRAOCULAR LENS  IMPLANT, BILATERAL  2006   CIRCUMCISION  03/29/2007   W/  CYSTOSCOPY AND TRANSRECTAL ULTRASOUND GUIDED PROSTATE BX'S   COLONOSCOPY  last one 08-26-2015   MICROLARYNGOSCOPY  04/20/2020   Procedure: MICROLARYNGOSCOPY WITH BIOPSY;  Surgeon: Laren Boom, DO;  Location: [facility];  Service: ENT;;   THULIUM LASER TURP (TRANSURETHRAL RESECTION OF PROSTATE) N/A 04/21/2016   Procedure: THULIUM LASER TURP (TRANSURETHRAL RESECTION OF PROSTATE);  Surgeon: Jethro Bolus, MD;  Location: [facility];  Service: Urology;  Laterality: N/A;   Social History   Social History Narrative   Not on file    Immunization History  Administered Date(s) Administered   Influenza Split 11/07/2012, 11/06/2013   Influenza, Quadrivalent, Recombinant, Inj, Pf 02/21/2018, 10/25/2018, 11/11/2019   Influenza,inj,Quad PF,6+ Mos 11/06/2013, 11/07/2014   Moderna Sars-Covid-2 Vaccination 02/18/2019, 03/18/2019, 03/27/2020   Tdap 02/08/2011     Objective: Vital Signs: BP 129/75 (BP Location: Left Arm, Patient Position: Sitting, Cuff Size: Normal)   Pulse 80   Resp 16   Ht 5\' 9"  (1.753 m)   Wt 209 lb (94.8 kg)   BMI 30.86 kg/m    Physical Exam Vitals and nursing note reviewed.  Constitutional:      Appearance: He is well-developed.  HENT:     Head: Normocephalic and atraumatic.  Eyes:     Conjunctiva/sclera: Conjunctivae normal.     Pupils: Pupils are equal, round,  and reactive to light.  Cardiovascular:     Rate and Rhythm: Normal rate and regular rhythm.     Heart sounds: Normal heart sounds.  Pulmonary:     Effort: Pulmonary effort is normal.     Breath sounds: Normal breath sounds.  Abdominal:     General: Bowel sounds are normal.     Palpations: Abdomen is soft.  Musculoskeletal:     Cervical back: Normal range of motion and neck supple.  Skin:    General: Skin is warm and dry.     Capillary Refill: Capillary refill takes less than 2 seconds.  Neurological:     Mental Status: He is alert and oriented to person, place, and time.  Psychiatric:        Behavior: Behavior normal.      Musculoskeletal Exam: Cervical spine was in good range of motion.  He had no discomfort on palpation of her thoracic or lumbar spine.  Shoulders and elbow joints were in good range of motion.  Wrist joints MCPs PIPs and DIPs were in good range of motion.  PIP and DIP prominence was noted.  Hip joints were in good range of motion.  Both knee joints were in good range of motion without any warmth swelling or effusion.  He had bilateral genu valgus deformity and also subluxation of ankles.  CDAI Exam: CDAI  Score: -- Patient Global: --; Provider Global: -- Swollen: --; Tender: -- Joint Exam 10/24/2022   No joint exam has been documented for this visit   There is currently no information documented on the homunculus. Go to the Rheumatology activity and complete the homunculus joint exam.  Investigation: No additional findings.  Imaging: No results found.  Recent Labs: Lab Results  Component Value Date   WBC 9.9 03/28/2022   HGB 14.1 03/28/2022   PLT 322 03/28/2022   NA 139 03/28/2022   K 4.0 03/28/2022   CL 103 03/28/2022   CO2 28 03/28/2022   GLUCOSE 70 03/28/2022   BUN 18 03/28/2022   CREATININE 0.72 03/28/2022   BILITOT 0.4 03/28/2022   AST 20 03/28/2022   ALT 22 03/28/2022   PROT 7.1 03/28/2022   CALCIUM 9.3 03/28/2022   GFRAA >60 10/08/2016    Speciality Comments: No specialty comments available.  Procedures:  No procedures performed Allergies: Penicillins, Bacitracin, Imidurea, Methylisothiazolinone, Neomycin, Polymyxin b, Pork-derived products, Sulfa antibiotics, and Urea   Assessment / Plan:     Visit Diagnoses: Primary osteoarthritis of both hands-bilateral PIP and DIP thickening was noted.  No synovitis was noted.  Patient states the discomfort has improved since he has been on meloxicam 15 mg p.o. daily.  Lateral epicondylitis, left elbow -resolved.  Patient had a left lateral epicondyle cortisone injection performed on 09/21/2021.  Primary osteoarthritis of both knees -he is followed at [facility].  He gets viscosupplement injections through [facility].  No warmth swelling or effusion was noted.  Primary osteoarthritis of both feet -he has ankle braces.  According to his sister he will require new shoes as he gets calluses from the shoes.  He is followed by his podiatrist Dr. Stacie Acres.  DDD (degenerative disc disease), thoracic - Thoracic kyphosis.  No point tenderness was noted.  DDD (degenerative disc disease), lumbar -he denies discomfort in his lumbar  spine.  He is on meloxicam 15 mg 1 tablet daily for pain relief.  His symptoms have improved on higher dose of meloxicam.  He is getting labs reviewed by his PCP.  Psoriasis-he has  occasional patches for which he uses topical agents.  Medication monitoring encounter-he was advised to get labs CBC and CMP every 6 months to monitor for drug toxicity.  Patient will get labs through his PCP.  Other medical problems are listed as follows:  Leg length discrepancy  History of hypercholesterolemia  History of hypertension-blood pressure was normal at 129/75.  History of diabetes mellitus  Orders: No orders of the defined types were placed in this encounter.  No orders of the defined types were placed in this encounter.   Follow-Up Instructions: Return in about 8 months (around 06/23/2023) for Osteoarthritis.   Pollyann Savoy, MD  Note - This record has been created using Animal nutritionist.  Chart creation errors have been sought, but may not always  have been located. Such creation errors do not reflect on  the standard of medical care.

## 2022-10-18 DIAGNOSIS — Z1339 Encounter for screening examination for other mental health and behavioral disorders: Secondary | ICD-10-CM | POA: Diagnosis not present

## 2022-10-18 DIAGNOSIS — Z23 Encounter for immunization: Secondary | ICD-10-CM | POA: Diagnosis not present

## 2022-10-18 DIAGNOSIS — M25569 Pain in unspecified knee: Secondary | ICD-10-CM | POA: Diagnosis not present

## 2022-10-18 DIAGNOSIS — E785 Hyperlipidemia, unspecified: Secondary | ICD-10-CM | POA: Diagnosis not present

## 2022-10-18 DIAGNOSIS — E1169 Type 2 diabetes mellitus with other specified complication: Secondary | ICD-10-CM | POA: Diagnosis not present

## 2022-10-18 DIAGNOSIS — E559 Vitamin D deficiency, unspecified: Secondary | ICD-10-CM | POA: Diagnosis not present

## 2022-10-18 DIAGNOSIS — N401 Enlarged prostate with lower urinary tract symptoms: Secondary | ICD-10-CM | POA: Diagnosis not present

## 2022-10-18 DIAGNOSIS — Z1331 Encounter for screening for depression: Secondary | ICD-10-CM | POA: Diagnosis not present

## 2022-10-18 DIAGNOSIS — M79671 Pain in right foot: Secondary | ICD-10-CM | POA: Diagnosis not present

## 2022-10-18 DIAGNOSIS — I10 Essential (primary) hypertension: Secondary | ICD-10-CM | POA: Diagnosis not present

## 2022-10-24 ENCOUNTER — Encounter: Payer: Self-pay | Admitting: Rheumatology

## 2022-10-24 ENCOUNTER — Ambulatory Visit: Payer: Medicare HMO | Attending: Rheumatology | Admitting: Rheumatology

## 2022-10-24 VITALS — BP 129/75 | HR 80 | Resp 16 | Ht 69.0 in | Wt 209.0 lb

## 2022-10-24 DIAGNOSIS — E1142 Type 2 diabetes mellitus with diabetic polyneuropathy: Secondary | ICD-10-CM | POA: Diagnosis not present

## 2022-10-24 DIAGNOSIS — M19071 Primary osteoarthritis, right ankle and foot: Secondary | ICD-10-CM

## 2022-10-24 DIAGNOSIS — Z8639 Personal history of other endocrine, nutritional and metabolic disease: Secondary | ICD-10-CM

## 2022-10-24 DIAGNOSIS — M19041 Primary osteoarthritis, right hand: Secondary | ICD-10-CM | POA: Diagnosis not present

## 2022-10-24 DIAGNOSIS — M19042 Primary osteoarthritis, left hand: Secondary | ICD-10-CM

## 2022-10-24 DIAGNOSIS — M5134 Other intervertebral disc degeneration, thoracic region: Secondary | ICD-10-CM

## 2022-10-24 DIAGNOSIS — M19072 Primary osteoarthritis, left ankle and foot: Secondary | ICD-10-CM

## 2022-10-24 DIAGNOSIS — M5136 Other intervertebral disc degeneration, lumbar region: Secondary | ICD-10-CM | POA: Diagnosis not present

## 2022-10-24 DIAGNOSIS — M2170 Unequal limb length (acquired), unspecified site: Secondary | ICD-10-CM | POA: Diagnosis not present

## 2022-10-24 DIAGNOSIS — M170 Bilateral primary osteoarthritis of knee: Secondary | ICD-10-CM

## 2022-10-24 DIAGNOSIS — Z5181 Encounter for therapeutic drug level monitoring: Secondary | ICD-10-CM | POA: Diagnosis not present

## 2022-10-24 DIAGNOSIS — M7712 Lateral epicondylitis, left elbow: Secondary | ICD-10-CM

## 2022-10-24 DIAGNOSIS — L409 Psoriasis, unspecified: Secondary | ICD-10-CM

## 2022-10-24 DIAGNOSIS — Z8679 Personal history of other diseases of the circulatory system: Secondary | ICD-10-CM

## 2022-11-03 ENCOUNTER — Ambulatory Visit (INDEPENDENT_AMBULATORY_CARE_PROVIDER_SITE_OTHER): Payer: Medicare HMO | Admitting: Neurology

## 2022-11-03 ENCOUNTER — Encounter: Payer: Self-pay | Admitting: Neurology

## 2022-11-03 VITALS — BP 122/71 | HR 68 | Ht 69.0 in | Wt 210.0 lb

## 2022-11-03 DIAGNOSIS — G309 Alzheimer's disease, unspecified: Secondary | ICD-10-CM | POA: Diagnosis not present

## 2022-11-03 DIAGNOSIS — F0390 Unspecified dementia without behavioral disturbance: Secondary | ICD-10-CM | POA: Insufficient documentation

## 2022-11-03 DIAGNOSIS — F03918 Unspecified dementia, unspecified severity, with other behavioral disturbance: Secondary | ICD-10-CM

## 2022-11-03 DIAGNOSIS — R6250 Unspecified lack of expected normal physiological development in childhood: Secondary | ICD-10-CM | POA: Diagnosis not present

## 2022-11-03 MED ORDER — ALPRAZOLAM 1 MG PO TABS: ORAL_TABLET | ORAL | 0 refills | Status: DC

## 2022-11-03 NOTE — Progress Notes (Signed)
Chief Complaint  Patient presents with   New Patient (Initial Visit)    Rm15, wife sister, progressive memory loss: MMSE was 15, loss of balance:can't stand for long frequent gait issues, tremor:ongoing several years bilateral hands, audio hallucinations, agitation      ASSESSMENT AND PLAN  Arthur Weaver is a 62 y.o. male   Early onset dementia with auditory hallucinations, History of hypoxic brain injury, developmentally delayed  MRI of the brain,  Laboratory evaluations to rule out treatable etiology  EEG  DIAGNOSTIC DATA (LABS, IMAGING, TESTING) - I reviewed patient records, labs, notes, testing and imaging myself where available.   MEDICAL HISTORY:  Arthur Weaver is a 62 year old male, accompanied by his sister Agustin Cree, seen in request by his primary care physician Dr. Alysia Penna, for memory loss, initial evaluation November 05, 2022  I reviewed and summarized the referring note. PMHX. 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,  premature, also had  umbilical cord around his neck, he is the oldest of 5 siblings, used to live with his parents, then to his sister, eventually group home since 2014  He was developmentally delayed, went to special school had 3 years of education, never worked or drive  But he had very good memory, used to be able to remember all the details, especially keep up with month, date, over the past few years, he was noted to have gradual onset of memory loss, gradually getting worse, now he is confused with the date,  During pandemic 2020, he suffered significant anxiety, was put on Prozac since then, which has helped his symptoms, during that period of time, he has developed delusional idea, auditory hallucinations, there was voices telling him brushing his teeth, but symptoms gradually improved after Prozac treatment  But since 2024, the voices come back, he goes to day center every day, often times the voices tell him the  center is close, he does not need to go, so he is often late to get himself ready for the activity, sometimes he will go back to his room put on his sleeping pajama on he supposed to attend his regular daily center activity  PHYSICAL EXAM:   Vitals:   11/03/22 1321  BP: 122/71  Pulse: 68  Weight: 210 lb (95.3 kg)  Height: 5\' 9"  (1.753 m)   Body mass index is 31.01 kg/m.  PHYSICAL EXAMNIATION:  Gen: NAD, conversant, well nourised, well groomed                     Cardiovascular: Regular rate rhythm, no peripheral edema, warm, nontender. Eyes: Conjunctivae clear without exudates or hemorrhage Neck: Supple, no carotid bruits. Pulmonary: Clear to auscultation bilaterally   NEUROLOGICAL EXAM:  MENTAL STATUS: Speech/cognition: Awake, alert, oriented to history taking and casual conversation    11/03/2022    1:18 PM  MMSE - Mini Mental State Exam  Orientation to time 3  Orientation to Place 3  Registration 2  Attention/ Calculation 0  Recall 2  Language- name 2 objects 2  Language- repeat 0  Language- follow 3 step command 3  Language- read & follow direction 0  Write a sentence 0  Copy design 0  Total score 15    CRANIAL NERVES: CN II: Visual fields are full to confrontation. Pupils are round equal and briskly reactive to light. CN III, IV, VI: extraocular movement are normal. No ptosis. CN V: Facial sensation is intact to light touch CN VII:  Face is symmetric with normal eye closure  CN VIII: Hearing is normal to causal conversation. CN IX, X: Phonation is normal. CN XI: Head turning and shoulder shrug are intact  MOTOR: There is no pronator drift of out-stretched arms. Muscle bulk and tone are normal. Muscle strength is normal.  REFLEXES: Reflexes are 1 and symmetric at the biceps, triceps, knees, and ankles. Plantar responses are flexor.  SENSORY: Intact to light touch, pinprick and vibratory sensation are intact in fingers and toes.  COORDINATION: There is  no trunk or limb dysmetria noted.  GAIT/STANCE: Posture is normal. Gait is steady    REVIEW OF SYSTEMS:  Full 14 system review of systems performed and notable only for as above All other review of systems were negative.   ALLERGIES: Allergies  Allergen Reactions   Penicillins Shortness Of Breath    "couldn't breathe" Has patient had a PCN reaction causing immediate rash, facial/tongue/throat swelling, SOB or lightheadedness with hypotension: No Has patient had a PCN reaction causing severe rash involving mucus membranes or skin necrosis: No Has patient had a PCN reaction that required hospitalization: No Has patient had a PCN reaction occurring within the last 10 years: No If all of the above answers are "NO", then may proceed with Cephalosporin use. "couldn't breathe" Has patient had a PCN reaction causing immediate rash, facial/tongue/throat swelling, SOB or lightheadedness with hypotension: No Has patient had a PCN reaction causing severe rash involving mucus membranes or skin necrosis: No Has patient had a PCN reaction that required hospitalization: No Has patient had a PCN reaction occurring within the last 10 years: No If all of the above answers are "NO", then may proceed with Cephalosporin use.    Bacitracin Other (See Comments)    Positive patch test   Imidurea Other (See Comments)    Positive patch test   Methylisothiazolinone Other (See Comments)    Positive patch test Positive patch test    Neomycin Other (See Comments)    Positive patch test   Polymyxin B Other (See Comments)    Positive patch test   Pork-Derived Products     AVOIDS DUE TO RELIGIOUS BELIEFS   Sulfa Antibiotics Nausea And Vomiting   Urea Other (See Comments)    Positive patch test    HOME MEDICATIONS: Current Outpatient Medications  Medication Sig Dispense Refill   acetaminophen (TYLENOL) 325 MG tablet Take 650 mg by mouth every 4 (four) hours as needed for moderate pain or headache.      amLODipine (NORVASC) 5 MG tablet Take 5 mg by mouth daily.      aspirin 81 MG EC tablet take 1 tab qhs     Calcium Carb-Cholecalciferol (CALCIUM+D3) 600-800 MG-UNIT TABS Take 1 tablet by mouth daily.     Cholecalciferol (VITAMIN D3) 1000 units CAPS Take 1,000 Units by mouth daily at 2 am.     FLUoxetine (PROZAC) 40 MG capsule Take 40 mg by mouth daily.     fluticasone (FLONASE) 50 MCG/ACT nasal spray Place 1 spray into both nostrils daily.     levocetirizine (XYZAL) 5 MG tablet take 1 tab qhs once daily     lisinopril (ZESTRIL) 40 MG tablet Take 1 tablet by mouth daily.     meloxicam (MOBIC) 15 MG tablet Take 15 mg by mouth daily.     Multiple Vitamin (MULTIVITAMIN PO) Take 1 tablet by mouth every evening.     nabumetone (RELAFEN) 500 MG tablet Take 500 mg by mouth daily as needed (Arthritis  pain).     omeprazole (PRILOSEC OTC) 20 MG tablet Take 1 tablet by mouth daily.     rosuvastatin (CRESTOR) 5 MG tablet Take 5 mg by mouth daily.     tamsulosin (FLOMAX) 0.4 MG CAPS capsule 0.4 mg daily after breakfast.     triamcinolone cream (KENALOG) 0.1 % Apply 1 application topically 2 (two) times daily as needed. Use of rashes. 453.6 g 0   No current facility-administered medications for this visit.    PAST MEDICAL HISTORY: Past Medical History:  Diagnosis Date   Anxiety    At risk for sleep apnea    STOP-BANG= 5       SENT TO PCP 04-14-2016   BPH (benign prostatic hyperplasia)    DDD (degenerative disc disease), cervical    DDD (degenerative disc disease), lumbar    Diverticulosis of colon    Family history of factor V deficiency    per pt's sister (whom is pt's guardian) she, pt's brother and parents have factor V but pt has never been tested   Frequency of urination    GERD (gastroesophageal reflux disease)    History of adenomatous polyp of colon    2006 and 03-18-2010 tubular adenoma   Hyperlipidemia    Hypertension    Lives in independent group home resides at [facility] in [place], [place]   pt independant w/ ADLs w/ exception does not cook for shop    Mentally challenged    SPECIAL NEEDS   OA (osteoarthritis)    hands, knees, feet   Pre-diabetes    Psoriasis    per patient's sister   Thoracic spondylosis    Urgency of urination    Wears partial dentures    lower    PAST SURGICAL HISTORY: Past Surgical History:  Procedure Laterality Date   CATARACT EXTRACTION W/ INTRAOCULAR LENS  IMPLANT, BILATERAL  2006   CIRCUMCISION  03/29/2007   W/  CYSTOSCOPY AND TRANSRECTAL ULTRASOUND GUIDED PROSTATE BX'S   COLONOSCOPY  last one 08-26-2015   MICROLARYNGOSCOPY  04/20/2020   Procedure: MICROLARYNGOSCOPY WITH BIOPSY;  Surgeon: Laren Boom, DO;  Location: [facility];  Service: ENT;;   THULIUM LASER TURP (TRANSURETHRAL RESECTION OF PROSTATE) N/A 04/21/2016   Procedure: THULIUM LASER TURP (TRANSURETHRAL RESECTION OF PROSTATE);  Surgeon: Jethro Bolus, MD;  Location: [facility];  Service: Urology;  Laterality: N/A;    FAMILY HISTORY: Family History  Problem Relation Age of Onset   Breast cancer Mother    Brain cancer Mother    Hypertension Mother    Diabetes Mother    Crohn's disease Father    Rheum arthritis Father    GER disease Father    Ulcers Father    Cataracts Sister    Fibromyalgia Sister    COPD Sister    Breast cancer Sister    GER disease Sister    Hypothyroidism Sister    Osteoarthritis Sister    GER disease Brother    GER disease Brother    Stroke Brother    Liver cancer Brother    Heart attack Brother    Colon cancer Neg Hx     SOCIAL HISTORY: Social History   Socioeconomic History   Marital status: Single    Spouse name: Not on file   Number of children: Not on file   Years of education: Not on file   Highest education level: Not on file  Occupational History   Not on file  Tobacco  Use   Smoking status: Never    Passive exposure: Never   Smokeless tobacco: Former    Types: Chew     Quit date: 04/14/1992  Vaping Use   Vaping status: Never Used  Substance and Sexual Activity   Alcohol use: No    Alcohol/week: 0.0 standard drinks of alcohol   Drug use: No   Sexual activity: Not on file  Other Topics Concern   Not on file  Social History Narrative   Not on file   Social Determinants of Health   Financial Resource Strain: Not on file  Food Insecurity: Not on file  Transportation Needs: Not on file  Physical Activity: Not on file  Stress: Not on file  Social Connections: Not on file  Intimate Partner Violence: Not on file      Levert Feinstein, M.D. Ph.D.  [facility] [address] [place], [place] [address] Ph: [phone] Fax: [phone]  CC:  Alysia Penna, MD [address] [place],  [place] [address]  Alysia Penna, MD

## 2022-11-04 ENCOUNTER — Telehealth: Payer: Self-pay | Admitting: Neurology

## 2022-11-04 LAB — HGB A1C W/O EAG: Hgb A1c MFr Bld: 5.8 % — ABNORMAL HIGH (ref 4.8–5.6)

## 2022-11-04 LAB — VITAMIN B12: Vitamin B-12: 545 pg/mL (ref 232–1245)

## 2022-11-04 LAB — TSH: TSH: 1.13 u[IU]/mL (ref 0.450–4.500)

## 2022-11-04 LAB — RPR: RPR Ser Ql: NONREACTIVE

## 2022-11-04 NOTE — Telephone Encounter (Signed)
[employer] [id number] exp. 11/04/22-01/03/23 sent to [facility] [phone]

## 2022-11-07 ENCOUNTER — Telehealth: Payer: Self-pay | Admitting: Neurology

## 2022-11-07 DIAGNOSIS — E119 Type 2 diabetes mellitus without complications: Secondary | ICD-10-CM | POA: Diagnosis not present

## 2022-11-07 DIAGNOSIS — H04123 Dry eye syndrome of bilateral lacrimal glands: Secondary | ICD-10-CM | POA: Diagnosis not present

## 2022-11-07 MED ORDER — ALPRAZOLAM 0.5 MG PO TABS: ORAL_TABLET | ORAL | 0 refills | Status: DC

## 2022-11-07 NOTE — Progress Notes (Signed)
Seen by casting department

## 2022-11-07 NOTE — Telephone Encounter (Signed)
Meds ordered this encounter  ?Medications  ? ALPRAZolam (XANAX) 0.5 MG tablet  ?  Sig: Take 1-2 tablets 30 minutes prior to MRI, may repeat once as needed. Must have driver.  ?  Dispense:  3 tablet  ?  Refill:  0  ?   ?

## 2022-11-07 NOTE — Telephone Encounter (Signed)
Phone rm: Tell pt that something was called into the pharmacy to help  Thanks,  Ahmaud Duthie

## 2022-11-07 NOTE — Telephone Encounter (Signed)
Pt's sister called and LVM stating that the pt will be needing something called in for him to stay still during the MRI that was scheduled. This is needing to be called in to the [facility] in [place]

## 2022-11-16 ENCOUNTER — Ambulatory Visit
Admission: RE | Admit: 2022-11-16 | Discharge: 2022-11-16 | Disposition: A | Discharge status: Home / Self Care | Payer: Medicare HMO | Source: Ambulatory Visit | Attending: Neurology | Admitting: Neurology

## 2022-11-16 DIAGNOSIS — G309 Alzheimer's disease, unspecified: Secondary | ICD-10-CM

## 2022-11-16 MED ORDER — GADOPICLENOL 0.5 MMOL/ML IV SOLN
10.0000 mL | Freq: Once | INTRAVENOUS | Status: AC | PRN
  Administered 2022-11-16: 10 mL via INTRAVENOUS

## 2022-11-28 ENCOUNTER — Telehealth (HOSPITAL_BASED_OUTPATIENT_CLINIC_OR_DEPARTMENT_OTHER): Payer: Medicare HMO | Admitting: Psychiatry

## 2022-11-28 ENCOUNTER — Encounter (HOSPITAL_COMMUNITY): Payer: Self-pay | Admitting: Psychiatry

## 2022-11-28 VITALS — Wt 204.0 lb

## 2022-11-28 DIAGNOSIS — R440 Auditory hallucinations: Secondary | ICD-10-CM

## 2022-11-28 DIAGNOSIS — F71 Moderate intellectual disabilities: Secondary | ICD-10-CM

## 2022-11-28 MED ORDER — OLANZAPINE 5 MG PO TABS
ORAL_TABLET | ORAL | 1 refills | Status: DC
Start: 2022-11-28 — End: 2023-01-19

## 2022-11-28 NOTE — Progress Notes (Signed)
Psychiatric Initial Adult Assessment    Virtual Visit via Video Note  I connected with Arthur Weaver on 11/28/22 at  9:00 AM EDT by a video enabled telemedicine application and verified that I am speaking with the correct person using two identifiers.  Location: Patient: Sister`s Home Provider: Home Office   I discussed the limitations of evaluation and management by telemedicine and the availability of in person appointments. The patient expressed understanding and agreed to proceed.  Patient Identification: Arthur Weaver MRN:  [id number] Date of Evaluation:  11/28/2022 Referral Source: Patient is 62 year old Caucasian who is referred from his primary care for psychiatric evaluation.  Chief Complaint:   Chief Complaint  Patient presents with   Establish Care   Hallucinations   Visit Diagnosis:    ICD-10-CM   1. Auditory hallucinations  R44.0     2. Intellectual developmental disorder, moderate  F71       History of Present Illness: Patient is 62 year old Caucasian, single, but diagnosis of intellectual developmental disorder and hallucination and seen by psychiatrist in our office in the past.  Patient's sister Agustin Cree was present in the session who has power of attorney and legal guardian of the patient.  Most of the information is provided by his sister.  She reported for the past few months his behavior has been difficult to control.  He is more forgetful, irritable, agitated and noncompliant with staff directions.  He lives in a group home for past 7 years.  One of the requirement is to go to Center however he has been noncompliant and telling staff that voices telling him not to go.  He also not sleeping well.  He tells staff that voices telling him not to change the clouds.  He is more irritable, having mood swings, short temper, anger.  He also called few times staff "N" word.  Sister concerned because she noticed more confused, not to his baseline.  There are times when  he threw his lunch in the trash because he does not want to go to the Center.  He gets easily upset.  When I ask patient why he is not going to Center, he replied the voices telling him not to go.  He admitted feeling paranoid around Center.  He denies any crying spells, suicidal thoughts.  He admitted sometimes feeling sad but also did not reported any hopelessness or suicidal thoughts or homicidal thoughts.  His appetite is fair.  As per sister, he had lost a few pounds in the past few months.  Recently he saw neurology who is in a process of getting neuroimaging studies.  His primary care is Dr. Alysia Penna who is managing his psychiatric symptoms and prescribed Prozac 40 mg.  During the session patient appears labile, speaking fast and sometimes babbling.  As per his sister, he is able to do his ADLs.  He is supposed to sleep at 9:00 but his sister noticed there are times when he is sleeping around 5:00.  In the past we had tried trazodone, Seroquel, olanzapine.  Olanzapine did work well but it was discontinued after his hallucinations and sleeps get better.    Associated Signs/Symptoms: Depression Symptoms:  insomnia, difficulty concentrating, impaired memory, disturbed sleep, (Hypo) Manic Symptoms:  Distractibility, Elevated Mood, Impulsivity, Irritable Mood, Labiality of Mood, Anxiety Symptoms:  Social Anxiety, Psychotic Symptoms:  Hallucinations: Auditory Paranoia, PTSD Symptoms: NA  Past Psychiatric History: No history of suicidal attempt, inpatient treatment, PTSD.  History of hallucination and seen by psychiatrist  during COVID after started to have psychotic symptoms and group home.  Prescribed quetiapine, olanzapine, trazodone and Prozac.    Previous Psychotropic Medications: Yes   Substance Abuse History in the last 12 months:  No.  Consequences of Substance Abuse: NA  Past Medical History:  Past Medical History:  Diagnosis Date   Anxiety    At risk for sleep apnea     STOP-BANG= 5       SENT TO PCP 04-14-2016   BPH (benign prostatic hyperplasia)    DDD (degenerative disc disease), cervical    DDD (degenerative disc disease), lumbar    Diverticulosis of colon    Family history of factor V deficiency    per pt's sister (whom is pt's guardian) she, pt's brother and parents have factor V but pt has never been tested   Frequency of urination    GERD (gastroesophageal reflux disease)    History of adenomatous polyp of colon    2006 and 03-18-2010 tubular adenoma   Hyperlipidemia    Hypertension    Lives in independent group home resides at [facility] in [place], [place]   pt independant w/ ADLs w/ exception does not cook for shop    Mentally challenged    SPECIAL NEEDS   OA (osteoarthritis)    hands, knees, feet   Pre-diabetes    Psoriasis    per patient's sister   Thoracic spondylosis    Urgency of urination    Wears partial dentures    lower    Past Surgical History:  Procedure Laterality Date   CATARACT EXTRACTION W/ INTRAOCULAR LENS  IMPLANT, BILATERAL  2006   CIRCUMCISION  03/29/2007   W/  CYSTOSCOPY AND TRANSRECTAL ULTRASOUND GUIDED PROSTATE BX'S   COLONOSCOPY  last one 08-26-2015   MICROLARYNGOSCOPY  04/20/2020   Procedure: MICROLARYNGOSCOPY WITH BIOPSY;  Surgeon: Laren Boom, DO;  Location: [facility] OR;  Service: ENT;;   THULIUM LASER TURP (TRANSURETHRAL RESECTION OF PROSTATE) N/A 04/21/2016   Procedure: THULIUM LASER TURP (TRANSURETHRAL RESECTION OF PROSTATE);  Surgeon: Jethro Bolus, MD;  Location: [facility];  Service: Urology;  Laterality: N/A;    Family Psychiatric History: 1 brother had anger problems.  Family History:  Family History  Problem Relation Age of Onset   Breast cancer Mother    Brain cancer Mother    Hypertension Mother    Diabetes Mother    Crohn's disease Father    Rheum arthritis Father    GER disease Father    Ulcers Father    Cataracts Sister    Fibromyalgia  Sister    COPD Sister    Breast cancer Sister    GER disease Sister    Hypothyroidism Sister    Osteoarthritis Sister    GER disease Brother    GER disease Brother    Stroke Brother    Liver cancer Brother    Heart attack Brother    Colon cancer Neg Hx     Social History:   Social History   Socioeconomic History   Marital status: Single    Spouse name: Not on file   Number of children: Not on file   Years of education: Not on file   Highest education level: Not on file  Occupational History   Not on file  Tobacco Use   Smoking status: Never    Passive exposure: Never   Smokeless tobacco: Former    Types: Chew    Quit date: 04/14/1992  Vaping Use   Vaping status: Never Used  Substance and Sexual Activity   Alcohol use: No    Alcohol/week: 0.0 standard drinks of alcohol   Drug use: No   Sexual activity: Not on file  Other Topics Concern   Not on file  Social History Narrative   Not on file   Social Determinants of Health   Financial Resource Strain: Not on file  Food Insecurity: Not on file  Transportation Needs: Not on file  Physical Activity: Not on file  Stress: Not on file  Social Connections: Not on file    Additional Social History: [place]  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  He diagnosed with learning disability and after third grade he did vocational rehab.  Patient lived with his mother until she passed away and her sister brought him to her place but started to have a lot of issues patient moved to group home.  Allergies:   Allergies  Allergen Reactions   Penicillins Shortness Of Breath    "couldn't breathe" Has patient had a PCN reaction causing immediate rash, facial/tongue/throat swelling, SOB or lightheadedness with hypotension: No Has patient had a PCN reaction causing severe rash involving mucus membranes or skin necrosis: No Has patient had a PCN reaction that  required hospitalization: No Has patient had a PCN reaction occurring within the last 10 years: No If all of the above answers are "NO", then may proceed with Cephalosporin use. "couldn't breathe" Has patient had a PCN reaction causing immediate rash, facial/tongue/throat swelling, SOB or lightheadedness with hypotension: No Has patient had a PCN reaction causing severe rash involving mucus membranes or skin necrosis: No Has patient had a PCN reaction that required hospitalization: No Has patient had a PCN reaction occurring within the last 10 years: No If all of the above answers are "NO", then may proceed with Cephalosporin use.    Bacitracin Other (See Comments)    Positive patch test   Imidurea Other (See Comments)    Positive patch test   Methylisothiazolinone Other (See Comments)    Positive patch test Positive patch test    Neomycin Other (See Comments)    Positive patch test   Polymyxin B Other (See Comments)    Positive patch test   Pork-Derived Products     AVOIDS DUE TO RELIGIOUS BELIEFS   Sulfa Antibiotics Nausea And Vomiting   Urea Other (See Comments)    Positive patch test    Metabolic Disorder Labs: Lab Results  Component Value Date   HGBA1C 5.8 (H) 11/03/2022   No results found for: "PROLACTIN" No results found for: "CHOL", "TRIG", "HDL", "CHOLHDL", "VLDL", "LDLCALC" Lab Results  Component Value Date   TSH 1.130 11/03/2022    Therapeutic Level Labs: No results found for: "LITHIUM" No results found for: "CBMZ" No results found for: "VALPROATE"  Current Medications: Current Outpatient Medications  Medication Sig Dispense Refill   acetaminophen (TYLENOL) 325 MG tablet Take 650 mg by mouth every 4 (four) hours as needed for moderate pain or headache.     ALPRAZolam (XANAX) 0.5 MG tablet Take 1-2 tablets 30 minutes prior to MRI, may repeat once as needed. Must have driver. 3 tablet 0   ALPRAZolam (XANAX) 1 MG tablet Take 1 tab at onset of migraine.  May  repeat in 2 hrs, if needed.  Max dose: 2 tabs/day. This is a 30 day prescription. 3 tablet 0  amLODipine (NORVASC) 5 MG tablet Take 5 mg by mouth daily.      aspirin 81 MG EC tablet take 1 tab qhs     Calcium Carb-Cholecalciferol (CALCIUM+D3) 600-800 MG-UNIT TABS Take 1 tablet by mouth daily.     Cholecalciferol (VITAMIN D3) 1000 units CAPS Take 1,000 Units by mouth daily at 2 am.     FLUoxetine (PROZAC) 40 MG capsule Take 40 mg by mouth daily.     fluticasone (FLONASE) 50 MCG/ACT nasal spray Place 1 spray into both nostrils daily.     levocetirizine (XYZAL) 5 MG tablet take 1 tab qhs once daily     lisinopril (ZESTRIL) 40 MG tablet Take 1 tablet by mouth daily.     meloxicam (MOBIC) 15 MG tablet Take 15 mg by mouth daily.     Multiple Vitamin (MULTIVITAMIN PO) Take 1 tablet by mouth every evening.     nabumetone (RELAFEN) 500 MG tablet Take 500 mg by mouth daily as needed (Arthritis pain).     omeprazole (PRILOSEC OTC) 20 MG tablet Take 1 tablet by mouth daily.     rosuvastatin (CRESTOR) 5 MG tablet Take 5 mg by mouth daily.     tamsulosin (FLOMAX) 0.4 MG CAPS capsule 0.4 mg daily after breakfast.     triamcinolone cream (KENALOG) 0.1 % Apply 1 application topically 2 (two) times daily as needed. Use of rashes. 453.6 g 0   No current facility-administered medications for this visit.    Musculoskeletal: Strength & Muscle Tone: within normal limits Gait & Station: normal Patient leans: N/A  Psychiatric Specialty Exam: Review of Systems  Musculoskeletal:        Ankle pain    Weight 204 lb (92.5 kg).There is no height or weight on file to calculate BMI.  General Appearance: Casual  Eye Contact:  Fair  Speech:   fast and babbling sometimes  Volume:  Normal  Mood:  Euthymic  Affect:  Labile  Thought Process:  Descriptions of Associations: Loose  Orientation:  Full (Time, Place, and Person)  Thought Content:  Hallucinations: Auditory Voices telling me not to go center, Ideas of  Reference:   Paranoia, Paranoid Ideation, and Rumination  Suicidal Thoughts:  No  Homicidal Thoughts:  No  Memory:  Immediate;   Poor Recent;   Fair Remote;   Fair  Judgement:  Other:  limited  Insight:  Shallow  Psychomotor Activity:  Decreased  Concentration:  Concentration: Poor and Attention Span: Fair  Recall:  Poor  Fund of Knowledge:Poor  Language: Fair  Akathisia:  No  Handed:  Right  AIMS (if indicated):  not done  Assets:  Desire for Improvement Housing Social Support  ADL's:  Intact  Cognition: Impaired,  Moderate  Sleep:  Fair   Screenings: Mini-Mental    Flowsheet Row Office Visit from 11/03/2022 in [facility]  Total Score (max 30 points ) 15      Flowsheet Row Admission (Discharged) from 04/20/2020 in [facility]  C-SSRS RISK CATEGORY No Risk       Assessment and Plan: I review previous notes, current medication, blood work results and psychosocial history.  Review screening which is done today.  Patient had a good response with olanzapine with 15 to 20 mg at bedtime.  It was discontinued after patient started to sleep better and hallucination subsided.  Recommend to restart olanzapine 5 mg only at bedtime.  His last hemoglobin A1c is 5.8.  Continue Prozac 40 mg which is  prescribed by PCP.  So far no tremors or shakes or any EPS.  Patient has intellectual disability and need monitoring for his medication, compliance and side effects.  We may consider increasing the dose in the future if needed.  We will follow-up in 4 to 6 weeks.  Recommended to call us back if is any question or any concern.  Treatment plan discussed with the patient's sister Agustin Cree who has power of attorney and she is also her legal guardian.  Collaboration of Care: Other provider involved in patient's care AEB notes in epic to review  Patient/Guardian was advised Release of Information must be obtained prior to any record release in order to  collaborate their care with an outside provider. Patient/Guardian was advised if they have not already done so to contact the registration department to sign all necessary forms in order for [place] to release information regarding their care.   Consent: Patient/Guardian gives verbal consent for treatment and assignment of benefits for services provided during this visit. Patient/Guardian expressed understanding and agreed to proceed.    Follow Up Instructions:    I discussed the assessment and treatment plan with the patient. The patient was provided an opportunity to ask questions and all were answered. The patient agreed with the plan and demonstrated an understanding of the instructions.   The patient was advised to call back or seek an in-person evaluation if the symptoms worsen or if the condition fails to improve as anticipated.  I provided 69 minutes of non-face-to-face time during this encounter.   Cleotis Nipper, MD 10/21/20249:01 AM

## 2022-12-01 ENCOUNTER — Ambulatory Visit (INDEPENDENT_AMBULATORY_CARE_PROVIDER_SITE_OTHER): Payer: Medicare HMO | Admitting: Neurology

## 2022-12-01 DIAGNOSIS — N312 Flaccid neuropathic bladder, not elsewhere classified: Secondary | ICD-10-CM | POA: Diagnosis not present

## 2022-12-01 DIAGNOSIS — N401 Enlarged prostate with lower urinary tract symptoms: Secondary | ICD-10-CM | POA: Diagnosis not present

## 2022-12-01 DIAGNOSIS — R4182 Altered mental status, unspecified: Secondary | ICD-10-CM

## 2022-12-01 DIAGNOSIS — R339 Retention of urine, unspecified: Secondary | ICD-10-CM | POA: Diagnosis not present

## 2022-12-01 DIAGNOSIS — R6250 Unspecified lack of expected normal physiological development in childhood: Secondary | ICD-10-CM

## 2022-12-01 DIAGNOSIS — G309 Alzheimer's disease, unspecified: Secondary | ICD-10-CM

## 2022-12-01 DIAGNOSIS — F03918 Unspecified dementia, unspecified severity, with other behavioral disturbance: Secondary | ICD-10-CM

## 2022-12-01 DIAGNOSIS — N138 Other obstructive and reflux uropathy: Secondary | ICD-10-CM | POA: Diagnosis not present

## 2022-12-17 NOTE — Procedures (Signed)
   HISTORY: 62 year old male with early onset dementia no  TECHNIQUE:  This is a routine 16 channel EEG recording with one channel devoted to a limited EKG recording.  It was performed during wakefulness, drowsiness and asleep.  Hyperventilation and photic stimulation were performed as activating procedures.  There are minimum muscle and movement artifact noted.  Upon maximum arousal, posterior dominant waking rhythm consistent of rhythmic alpha range activity. Activities are symmetric over the bilateral posterior derivations and attenuated with eye opening.  Photic stimulation did not alter the tracing.  Hyperventilation produced mild/moderate buildup with higher amplitude and the slower activities noted.  During EEG recording, patient developed drowsiness and no deeper stage of sleep was achieved  During EEG recording, there was no epileptiform discharge noted.  EKG demonstrate normal sinus rhythm.  CONCLUSION: This is a  normal awake EEG.  There is no electrodiagnostic evidence of epileptiform discharge.  Levert Feinstein, M.D. Ph.D.  [facility] [address] [place], [place] [address] Phone: [phone] Fax:      [phone]

## 2022-12-19 ENCOUNTER — Encounter: Payer: Self-pay | Admitting: Internal Medicine

## 2022-12-20 ENCOUNTER — Other Ambulatory Visit (HOSPITAL_COMMUNITY): Payer: Self-pay | Admitting: Psychiatry

## 2022-12-20 DIAGNOSIS — F71 Moderate intellectual disabilities: Secondary | ICD-10-CM

## 2022-12-20 DIAGNOSIS — R440 Auditory hallucinations: Secondary | ICD-10-CM

## 2023-01-02 ENCOUNTER — Encounter: Payer: Self-pay | Admitting: Podiatry

## 2023-01-02 ENCOUNTER — Ambulatory Visit (INDEPENDENT_AMBULATORY_CARE_PROVIDER_SITE_OTHER): Payer: Medicare HMO | Admitting: Podiatry

## 2023-01-02 DIAGNOSIS — B351 Tinea unguium: Secondary | ICD-10-CM | POA: Diagnosis not present

## 2023-01-02 DIAGNOSIS — G8929 Other chronic pain: Secondary | ICD-10-CM

## 2023-01-02 DIAGNOSIS — M79676 Pain in unspecified toe(s): Secondary | ICD-10-CM

## 2023-01-02 DIAGNOSIS — E0851 Diabetes mellitus due to underlying condition with diabetic peripheral angiopathy without gangrene: Secondary | ICD-10-CM | POA: Diagnosis not present

## 2023-01-02 DIAGNOSIS — M129 Arthropathy, unspecified: Secondary | ICD-10-CM

## 2023-01-02 DIAGNOSIS — M25571 Pain in right ankle and joints of right foot: Secondary | ICD-10-CM

## 2023-01-02 DIAGNOSIS — L84 Corns and callosities: Secondary | ICD-10-CM

## 2023-01-02 NOTE — Progress Notes (Signed)
This patient returns to my office for at risk foot care.  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  This patient is unable to cut nails himself since the patient cannot reach his nails.These nails are painful walking and wearing shoes. Patient also has painful callus  on both feet since he is unable to self treat.   This patient presents for at risk foot care today.  General Appearance  Alert, conversant and in no acute stress.  Vascular  Dorsalis pedis and posterior tibial  pulses are not  palpable  bilaterally.  Capillary return is within normal limits  bilaterally. Temperature is within normal limits  bilaterally.  Neurologic  Senn-Weinstein monofilament wire test diminished   bilaterally. Muscle power within normal limits bilaterally.  Nails Thick disfigured discolored nails with subungual debris  from hallux to fifth toes bilaterally. No evidence of bacterial infection or drainage bilaterally.  Orthopedic  No limitations of motion  feet .  No crepitus or effusions noted.  No bony pathology or digital deformities noted. 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  Skin  normotropic skin  noted bilaterally.  No signs of infections or ulcers noted.   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  Callus left hallux.  Callus midfoot right    Porokeratosis B/L.  Onychomycosis  Consent was obtained for treatment procedures.   Debride callus with # 15 blade followed by dremel tool. Followed by dremel tool usage.  No infection or ulcer.  Patient discussed footwear with Carney Bern.   Return office visit   10 weeks                  Told patient to return for periodic foot care and evaluation due to potential at risk complications.     Helane Gunther DPM

## 2023-01-19 ENCOUNTER — Ambulatory Visit (HOSPITAL_COMMUNITY): Payer: Medicare HMO | Admitting: Psychiatry

## 2023-01-19 ENCOUNTER — Encounter (HOSPITAL_COMMUNITY): Payer: Self-pay | Admitting: Psychiatry

## 2023-01-19 DIAGNOSIS — F71 Moderate intellectual disabilities: Secondary | ICD-10-CM | POA: Diagnosis not present

## 2023-01-19 DIAGNOSIS — R440 Auditory hallucinations: Secondary | ICD-10-CM | POA: Diagnosis not present

## 2023-01-19 MED ORDER — OLANZAPINE 7.5 MG PO TABS
ORAL_TABLET | ORAL | 1 refills | Status: DC
Start: 2023-01-19 — End: 2023-01-19

## 2023-01-19 MED ORDER — OLANZAPINE 7.5 MG PO TABS
ORAL_TABLET | ORAL | 1 refills | Status: DC
Start: 2023-01-19 — End: 2023-01-31

## 2023-01-19 NOTE — Patient Instructions (Signed)
Need to watch his calorie intake, regular walking 20 minutes at least three times a week. No eating after supper. Cut down carb.

## 2023-01-19 NOTE — Progress Notes (Signed)
BH MD/PA/NP OP Progress Note  Patient location; office Provider location; office  01/19/2023 10:00 AM BERNARD BANKEN  MRN:  [id number]  Chief Complaint:  Chief Complaint  Patient presents with   Follow-up   HPI: Patient came today for his follow-up appointment with his sister.  He is a diagnosis of intellectual developmental behavioral disorder and hallucination.  His sister Agustin Cree who has a power of attorney came with the patient.  Patient is a poor historian and most of the information was obtained from his sister.  Started him on a low-dose olanzapine 5 mg.  Patient told his voices are less intense and he is sleeping better.  He still hears voices telling him "go and brush your teeth".  His sister noticed he is less confrontational but still he has moments of irritability, frustration and get anger or no reason.  Recently had a visit with neurology and had an EEG which was found to be normal.  He has MRI which was also inconclusive.  Patient appears restless, fidgety during the session.  He started going to Center which initially he believed it was closed.  He admitted Center is going because there is no classes going on.  His sister told used to be her learning sessions and activity but due to limited staff there is no such activity going on.  Patient spending more time watching television.  He lives at [facility] group home facility with 6 other people.  As per sister he is getting better with the staff and has not been using N words.  Sister concerned that he is eating more than usual and sometime eat after supper.  In the past few months he had gained weight.  He also had mild tremors.  He does not walk or go outside even though his primary care recommended.  His last hemoglobin A1c 5.8.  He is taking Prozac 40 mg prescribed by PCP Dr. Sherian Rein.  He denies any suicidal thoughts or homicidal thoughts.  He is able to do his ADLs.  He stays to himself and not much interaction with other  people.   Visit Diagnosis:    ICD-10-CM   1. Intellectual developmental disorder, moderate  F71 OLANZapine (ZYPREXA) 7.5 MG tablet    DISCONTINUED: OLANZapine (ZYPREXA) 7.5 MG tablet    2. Auditory hallucinations  R44.0 OLANZapine (ZYPREXA) 7.5 MG tablet    DISCONTINUED: OLANZapine (ZYPREXA) 7.5 MG tablet      Past Psychiatric History: History of hallucination.  Saw Dr Velora Heckler and Dr.Cobo during COVID having behavior problems in the group home.  Given the Seroquel, trazodone, olanzapine and Prozac.  No history of suicidal attempt, inpatient.  Past Medical History:  Past Medical History:  Diagnosis Date   Anxiety    At risk for sleep apnea    STOP-BANG= 5       SENT TO PCP 04-14-2016   BPH (benign prostatic hyperplasia)    DDD (degenerative disc disease), cervical    DDD (degenerative disc disease), lumbar    Diverticulosis of colon    Family history of factor V deficiency    per pt's sister (whom is pt's guardian) she, pt's brother and parents have factor V but pt has never been tested   Frequency of urination    GERD (gastroesophageal reflux disease)    History of adenomatous polyp of colon    2006 and 03-18-2010 tubular adenoma   Hyperlipidemia    Hypertension    Lives in independent group home resides at [facility] in [place], [place]   pt independant w/ ADLs w/ exception does not cook for shop    Mentally challenged    SPECIAL NEEDS   OA (osteoarthritis)    hands, knees, feet   Pre-diabetes    Psoriasis    per patient's sister   Thoracic spondylosis    Urgency of urination    Wears partial dentures    lower    Past Surgical History:  Procedure Laterality Date   CATARACT EXTRACTION W/ INTRAOCULAR LENS  IMPLANT, BILATERAL  2006   CIRCUMCISION  03/29/2007   W/  CYSTOSCOPY AND TRANSRECTAL ULTRASOUND GUIDED PROSTATE BX'S   COLONOSCOPY  last one 08-26-2015   MICROLARYNGOSCOPY  04/20/2020   Procedure: MICROLARYNGOSCOPY WITH BIOPSY;  Surgeon:  Laren Boom, DO;  Location: [facility] OR;  Service: ENT;;   THULIUM LASER TURP (TRANSURETHRAL RESECTION OF PROSTATE) N/A 04/21/2016   Procedure: THULIUM LASER TURP (TRANSURETHRAL RESECTION OF PROSTATE);  Surgeon: Jethro Bolus, MD;  Location: [facility];  Service: Urology;  Laterality: N/A;    Family Psychiatric History: Reviewed  Family History:  Family History  Problem Relation Age of Onset   Breast cancer Mother    Brain cancer Mother    Hypertension Mother    Diabetes Mother    Crohn's disease Father    Rheum arthritis Father    GER disease Father    Ulcers Father    Cataracts Sister    Fibromyalgia Sister    COPD Sister    Breast cancer Sister    GER disease Sister    Hypothyroidism Sister    Osteoarthritis Sister    GER disease Brother    GER disease Brother    Stroke Brother    Liver cancer Brother    Heart attack Brother    Colon cancer Neg Hx     Social History:  Social History   Socioeconomic History   Marital status: Single    Spouse name: Not on file   Number of children: Not on file   Years of education: Not on file   Highest education level: Not on file  Occupational History   Not on file  Tobacco Use   Smoking status: Never    Passive exposure: Never   Smokeless tobacco: Former    Types: Chew    Quit date: 04/14/1992  Vaping Use   Vaping status: Never Used  Substance and Sexual Activity   Alcohol use: No    Alcohol/week: 0.0 standard drinks of alcohol   Drug use: No   Sexual activity: Not on file  Other Topics Concern   Not on file  Social History Narrative   Not on file   Social Drivers of Health   Financial Resource Strain: Not on file  Food Insecurity: Not on file  Transportation Needs: Not on file  Physical Activity: Not on file  Stress: Not on file  Social Connections: Not on file    Allergies:  Allergies  Allergen Reactions   Penicillins Shortness Of Breath    "couldn't breathe" Has patient had a PCN  reaction causing immediate rash, facial/tongue/throat swelling, SOB or lightheadedness with hypotension: No Has patient had a PCN reaction causing severe rash involving mucus membranes or skin necrosis: No Has patient had a PCN reaction that required hospitalization: No Has patient had a PCN reaction occurring within the last 10 years: No If all of the above answers are "NO", then may proceed with Cephalosporin use. "couldn't breathe" Has patient  had a PCN reaction causing immediate rash, facial/tongue/throat swelling, SOB or lightheadedness with hypotension: No Has patient had a PCN reaction causing severe rash involving mucus membranes or skin necrosis: No Has patient had a PCN reaction that required hospitalization: No Has patient had a PCN reaction occurring within the last 10 years: No If all of the above answers are "NO", then may proceed with Cephalosporin use.    Bacitracin Other (See Comments)    Positive patch test   Imidurea Other (See Comments)    Positive patch test   Methylisothiazolinone Other (See Comments)    Positive patch test Positive patch test    Neomycin Other (See Comments)    Positive patch test   Polymyxin B Other (See Comments)    Positive patch test   Pork-Derived Products     AVOIDS DUE TO RELIGIOUS BELIEFS   Sulfa Antibiotics Nausea And Vomiting   Urea Other (See Comments)    Positive patch test    Metabolic Disorder Labs: Lab Results  Component Value Date   HGBA1C 5.8 (H) 11/03/2022   No results found for: "PROLACTIN" No results found for: "CHOL", "TRIG", "HDL", "CHOLHDL", "VLDL", "LDLCALC" Lab Results  Component Value Date   TSH 1.130 11/03/2022    Therapeutic Level Labs: No results found for: "LITHIUM" No results found for: "VALPROATE" No results found for: "CBMZ"  Current Medications: Current Outpatient Medications  Medication Sig Dispense Refill   acetaminophen (TYLENOL) 325 MG tablet Take 650 mg by mouth every 4 (four) hours as  needed for moderate pain or headache.     ALPRAZolam (XANAX) 0.5 MG tablet Take 1-2 tablets 30 minutes prior to MRI, may repeat once as needed. Must have driver. 3 tablet 0   ALPRAZolam (XANAX) 1 MG tablet Take 1 tab at onset of migraine.  May repeat in 2 hrs, if needed.  Max dose: 2 tabs/day. This is a 30 day prescription. 3 tablet 0   amLODipine (NORVASC) 5 MG tablet Take 5 mg by mouth daily.      aspirin 81 MG EC tablet take 1 tab qhs     Calcium Carb-Cholecalciferol (CALCIUM+D3) 600-800 MG-UNIT TABS Take 1 tablet by mouth daily.     Cholecalciferol (VITAMIN D3) 1000 units CAPS Take 1,000 Units by mouth daily at 2 am.     FLUoxetine (PROZAC) 40 MG capsule Take 40 mg by mouth daily.     fluticasone (FLONASE) 50 MCG/ACT nasal spray Place 1 spray into both nostrils daily.     levocetirizine (XYZAL) 5 MG tablet take 1 tab qhs once daily     lisinopril (ZESTRIL) 40 MG tablet Take 1 tablet by mouth daily.     meloxicam (MOBIC) 15 MG tablet Take 15 mg by mouth daily.     Multiple Vitamin (MULTIVITAMIN PO) Take 1 tablet by mouth every evening.     nabumetone (RELAFEN) 500 MG tablet Take 500 mg by mouth daily as needed (Arthritis pain). (Patient not taking: Reported on 11/28/2022)     OLANZapine (ZYPREXA) 5 MG tablet Take one tab at 9 pm 30 tablet 1   omeprazole (PRILOSEC OTC) 20 MG tablet Take 1 tablet by mouth daily.     rosuvastatin (CRESTOR) 5 MG tablet Take 5 mg by mouth daily.     tamsulosin (FLOMAX) 0.4 MG CAPS capsule 0.4 mg daily after breakfast.     triamcinolone cream (KENALOG) 0.1 % Apply 1 application topically 2 (two) times daily as needed. Use of rashes. 453.6 g 0  No current facility-administered medications for this visit.     Musculoskeletal: Strength & Muscle Tone: decreased and ankle pan and deformity Gait & Station:  see above Patient leans:  see above  Psychiatric Specialty Exam: Review of Systems  Psychiatric/Behavioral:  Positive for confusion, decreased  concentration and hallucinations. The patient is nervous/anxious.     Blood pressure (!) 165/83, pulse 92, height 5\' 7"  (1.702 m), weight 224 lb (101.6 kg).There is no height or weight on file to calculate BMI.  General Appearance: Casual  Eye Contact:  Fair  Speech:   fast and babling speech  Volume:  Decreased  Mood:  Anxious  Affect:  Labile  Thought Process:  Descriptions of Associations: Loose  Orientation:  Full (Time, Place, and Person)  Thought Content: Hallucinations: Auditory and Paranoid Ideation   Suicidal Thoughts:  No  Homicidal Thoughts:  No  Memory:  Immediate;   Poor Recent;   Fair Remote;   Fair  Judgement:  Fair  Insight:  Shallow  Psychomotor Activity:  Decreased, Restlessness, Tremor, and fidgety   Concentration:  Concentration: Poor and Attention Span: Poor  Recall:  Poor  Fund of Knowledge: Poor  Language: Fair  Akathisia:  No  Handed:  Right  AIMS (if indicated): not done  Assets:  Desire for Improvement Housing Social Support  ADL's:  Intact  Cognition: Impaired,  Moderate  Sleep:   better   Screenings: Mini-Mental    Flowsheet Row Office Visit from 11/03/2022 in [facility]  Total Score (max 30 points ) 15      PHQ2-9    Flowsheet Row Video Visit from 11/28/2022 in [facility]  PHQ-2 Total Score 2  PHQ-9 Total Score 13      Flowsheet Row Video Visit from 11/28/2022 in [facility] Admission (Discharged) from 04/20/2020 in [facility]  C-SSRS RISK CATEGORY Error: Question 6 not populated No Risk        Assessment and Plan: I reviewed neuroimaging results, EEG.  They are normal.  He is doing better with olanzapine 5 mg.  I have a long discussion with the patient and his sister who have power of attorney about watching his calorie intake as he has gained weight.  I explained olanzapine can also cause weight gain  and blood sugar and he need to watch closely his diet.  I explained that there is a benefit increasing the olanzapine dose and we can try 7.5 mg a day to be compliant with healthy diet.  Patient's sister concern that mood is still very labile and get easily upset and angry.  We will try olanzapine 7.5 mg.  Continue Prozac 40 mg prescribed by PCP.  Encourage to have blood work done soon to follow up with hemoglobin A1c.  Discussed safety concerns and any time having active suicidal thoughts or homicidal thought then he need to call 911 or go to local emergency room.  His hallucinations has been better since started the olanzapine.  Follow up in 6 weeks in person.  I provided instruction about his walking, exercise, watching his caloric intake in AVS.   Collaboration of Care: Collaboration of Care: Other provider involved in patient's care AEB notes are available in epic to review  Patient/Guardian was advised Release of Information must be obtained prior to any record release in order to collaborate their care with an outside provider. Patient/Guardian was advised if they have not already done so to contact the  registration department to sign all necessary forms in order for [place] to release information regarding their care.   Consent: Patient/Guardian gives verbal consent for treatment and assignment of benefits for services provided during this visit. Patient/Guardian expressed understanding and agreed to proceed.   I provided 34 minutes face-to-face time during this encounter.  Cleotis Nipper, MD 01/19/2023, 10:00 AM

## 2023-01-25 DIAGNOSIS — F330 Major depressive disorder, recurrent, mild: Secondary | ICD-10-CM | POA: Diagnosis not present

## 2023-01-25 DIAGNOSIS — E1169 Type 2 diabetes mellitus with other specified complication: Secondary | ICD-10-CM | POA: Diagnosis not present

## 2023-01-25 DIAGNOSIS — R413 Other amnesia: Secondary | ICD-10-CM | POA: Diagnosis not present

## 2023-01-25 DIAGNOSIS — C329 Malignant neoplasm of larynx, unspecified: Secondary | ICD-10-CM | POA: Diagnosis not present

## 2023-01-25 DIAGNOSIS — F79 Unspecified intellectual disabilities: Secondary | ICD-10-CM | POA: Diagnosis not present

## 2023-01-25 DIAGNOSIS — I10 Essential (primary) hypertension: Secondary | ICD-10-CM | POA: Diagnosis not present

## 2023-01-25 DIAGNOSIS — R443 Hallucinations, unspecified: Secondary | ICD-10-CM | POA: Diagnosis not present

## 2023-01-31 ENCOUNTER — Other Ambulatory Visit (HOSPITAL_COMMUNITY): Payer: Self-pay | Admitting: *Deleted

## 2023-01-31 DIAGNOSIS — R440 Auditory hallucinations: Secondary | ICD-10-CM

## 2023-01-31 DIAGNOSIS — F71 Moderate intellectual disabilities: Secondary | ICD-10-CM

## 2023-01-31 MED ORDER — OLANZAPINE 7.5 MG PO TABS: ORAL_TABLET | ORAL | 1 refills | Status: DC

## 2023-02-10 DIAGNOSIS — N401 Enlarged prostate with lower urinary tract symptoms: Secondary | ICD-10-CM | POA: Diagnosis not present

## 2023-02-10 DIAGNOSIS — E559 Vitamin D deficiency, unspecified: Secondary | ICD-10-CM | POA: Diagnosis not present

## 2023-02-10 DIAGNOSIS — I10 Essential (primary) hypertension: Secondary | ICD-10-CM | POA: Diagnosis not present

## 2023-02-10 DIAGNOSIS — E1169 Type 2 diabetes mellitus with other specified complication: Secondary | ICD-10-CM | POA: Diagnosis not present

## 2023-02-10 DIAGNOSIS — R82998 Other abnormal findings in urine: Secondary | ICD-10-CM | POA: Diagnosis not present

## 2023-02-10 DIAGNOSIS — E785 Hyperlipidemia, unspecified: Secondary | ICD-10-CM | POA: Diagnosis not present

## 2023-02-17 ENCOUNTER — Ambulatory Visit (AMBULATORY_SURGERY_CENTER): Payer: Medicare HMO

## 2023-02-17 VITALS — Ht 67.0 in | Wt 223.0 lb

## 2023-02-17 DIAGNOSIS — E1169 Type 2 diabetes mellitus with other specified complication: Secondary | ICD-10-CM | POA: Diagnosis not present

## 2023-02-17 DIAGNOSIS — I10 Essential (primary) hypertension: Secondary | ICD-10-CM | POA: Diagnosis not present

## 2023-02-17 DIAGNOSIS — Z0000 Encounter for general adult medical examination without abnormal findings: Secondary | ICD-10-CM | POA: Diagnosis not present

## 2023-02-17 DIAGNOSIS — N312 Flaccid neuropathic bladder, not elsewhere classified: Secondary | ICD-10-CM | POA: Diagnosis not present

## 2023-02-17 DIAGNOSIS — C329 Malignant neoplasm of larynx, unspecified: Secondary | ICD-10-CM | POA: Diagnosis not present

## 2023-02-17 DIAGNOSIS — F330 Major depressive disorder, recurrent, mild: Secondary | ICD-10-CM | POA: Diagnosis not present

## 2023-02-17 DIAGNOSIS — Z860100 Personal history of colon polyps, unspecified: Secondary | ICD-10-CM

## 2023-02-17 DIAGNOSIS — F79 Unspecified intellectual disabilities: Secondary | ICD-10-CM | POA: Diagnosis not present

## 2023-02-17 NOTE — Progress Notes (Signed)

## 2023-02-20 DIAGNOSIS — C320 Malignant neoplasm of glottis: Secondary | ICD-10-CM | POA: Diagnosis not present

## 2023-02-20 DIAGNOSIS — R490 Dysphonia: Secondary | ICD-10-CM | POA: Diagnosis not present

## 2023-02-20 DIAGNOSIS — K219 Gastro-esophageal reflux disease without esophagitis: Secondary | ICD-10-CM | POA: Diagnosis not present

## 2023-02-23 ENCOUNTER — Telehealth: Payer: Self-pay | Admitting: Internal Medicine

## 2023-02-23 NOTE — Telephone Encounter (Signed)
Patient's sister called stated he has not received his prep medication for upcoming procedure, would like to be sent to [facility] pharmacy.

## 2023-02-23 NOTE — Telephone Encounter (Signed)
Spoke with pt sister. Pt advised to pick up hard copy of prep instructions from 2nd floor lobby. Pt waiting to see if they come tomorrow and if not she will pick up a hard copy

## 2023-03-01 NOTE — Telephone Encounter (Signed)
PT needs another hard copy of prep instructions with new times. Please advise.

## 2023-03-01 NOTE — Telephone Encounter (Signed)
Returned call.  Spoke with POA, Drinda Butts, she is requesting new prep instructions be mailed to her.  Colonoscopy date now 04/12/23.  New instructions mailed to patient/POA

## 2023-03-01 NOTE — Telephone Encounter (Signed)
TC made to POA, informed her that the reason patient's colonoscopy was cancelled on 1/24 and r/s'd to 04/12/23 is d/t Dr. Leone Payor not having a schedule in [facility] that day.  She verbalized understanding.

## 2023-03-03 ENCOUNTER — Encounter: Payer: Medicare HMO | Admitting: Internal Medicine

## 2023-03-09 DIAGNOSIS — M170 Bilateral primary osteoarthritis of knee: Secondary | ICD-10-CM | POA: Diagnosis not present

## 2023-03-14 ENCOUNTER — Ambulatory Visit (INDEPENDENT_AMBULATORY_CARE_PROVIDER_SITE_OTHER): Payer: Medicare HMO | Admitting: Podiatry

## 2023-03-14 ENCOUNTER — Encounter: Payer: Self-pay | Admitting: Podiatry

## 2023-03-14 DIAGNOSIS — E0851 Diabetes mellitus due to underlying condition with diabetic peripheral angiopathy without gangrene: Secondary | ICD-10-CM | POA: Diagnosis not present

## 2023-03-14 DIAGNOSIS — L84 Corns and callosities: Secondary | ICD-10-CM

## 2023-03-14 DIAGNOSIS — B351 Tinea unguium: Secondary | ICD-10-CM

## 2023-03-14 DIAGNOSIS — M79676 Pain in unspecified toe(s): Secondary | ICD-10-CM

## 2023-03-14 DIAGNOSIS — M79673 Pain in unspecified foot: Secondary | ICD-10-CM

## 2023-03-14 NOTE — Progress Notes (Signed)
 This patient returns to my office for at risk foot care.  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  This patient is unable to cut nails himself since the patient cannot reach his nails.These nails are painful walking and wearing shoes. Patient also has painful callus  on both feet since he is unable to self treat.   This patient presents for at risk foot care today.  General Appearance  Alert, conversant and in no acute stress.  Vascular  Dorsalis pedis and posterior tibial  pulses are not  palpable  bilaterally.  Capillary return is within normal limits  bilaterally. Temperature is within normal limits  bilaterally.  Neurologic  Senn-Weinstein monofilament wire test diminished   bilaterally. Muscle power within normal limits bilaterally.  Nails Thick disfigured discolored nails with subungual debris  from hallux to fifth toes bilaterally. No evidence of bacterial infection or drainage bilaterally.  Orthopedic  No limitations of motion  feet .  No crepitus or effusions noted.  No bony pathology or digital deformities noted. 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  Skin  normotropic skin  noted bilaterally.  No signs of infections or ulcers noted.   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  Callus left hallux.  Callus midfoot right    Porokeratosis B/L.  Onychomycosis  Consent was obtained for treatment procedures.   Debride callus with # 15 blade followed by dremel tool. Followed by dremel tool usage.  No infection or ulcer. Debride nails with nail nipper and dremel tool. Brace dispensed to patient.   Return office visit   10 weeks                  Told patient to return for periodic foot care and evaluation due to potential at risk complications.     Cordella Bold DPM

## 2023-03-16 DIAGNOSIS — M170 Bilateral primary osteoarthritis of knee: Secondary | ICD-10-CM | POA: Diagnosis not present

## 2023-03-23 ENCOUNTER — Encounter (HOSPITAL_COMMUNITY): Payer: Self-pay | Admitting: Psychiatry

## 2023-03-23 ENCOUNTER — Other Ambulatory Visit: Payer: Self-pay

## 2023-03-23 ENCOUNTER — Ambulatory Visit (HOSPITAL_BASED_OUTPATIENT_CLINIC_OR_DEPARTMENT_OTHER): Payer: Medicare HMO | Admitting: Psychiatry

## 2023-03-23 VITALS — BP 150/78 | HR 72 | Ht 67.0 in | Wt 232.0 lb

## 2023-03-23 DIAGNOSIS — R440 Auditory hallucinations: Secondary | ICD-10-CM

## 2023-03-23 DIAGNOSIS — F71 Moderate intellectual disabilities: Secondary | ICD-10-CM

## 2023-03-23 DIAGNOSIS — M170 Bilateral primary osteoarthritis of knee: Secondary | ICD-10-CM | POA: Diagnosis not present

## 2023-03-23 MED ORDER — OLANZAPINE 7.5 MG PO TABS: ORAL_TABLET | ORAL | 0 refills | Status: DC

## 2023-03-23 NOTE — Progress Notes (Signed)
 [facility] MD/PA/NP OP Progress Note  Patient location; office Provider location; office  03/23/2023 11:14 AM Arthur Weaver  MRN:  [id number]  Chief Complaint:  Chief Complaint  Patient presents with   Follow-up   HPI:  Patient came today for his follow-up appointment with his sister.  On the last visit we increased olanzapine 7.5 mg.  Sister noticed improvement in his mood, irritability and anger but is still gets sometimes frustrated.  Overall sleep is good.  Patient is a poor historian and most of the information was obtained from his sister.  Patient told he had a good Christmas.  He received old cards from his nephew and is very happy about it.  Patient admitted weight gain because he is not watching his calorie intake and not doing exercise.  He gained 10 pounds in past 2 months.  Recently had a visit with his primary care.  He was told to watch his calorie intake and portion control.  His last hemoglobin A1c 5.9 which was done December 2024.  Patient still sometimes hear voices especially before going to bed but they are less intense and he likes because voices telling him to go to sleep.  He is getting along better with the staff.  He was upset because there cables were out but sister recently bought [employer] and in the beginning he has trouble using it but now sister had explained how to use.  He does enjoy old movies.  He has mild tremors but does not interfere in his daily activities.  He goes to Center frequently.  He lives at [facility] group home with 6 other people.  He is taking Prozac 40 mg prescribed by PCP.  He denies any suicidal thoughts, aggression, violence.  His attention concentration is poor.  However he is able to answer the question.  He does require a lot of reassurance and redirection in the conversation.  His speech is fast and sometime babbling.  Visit Diagnosis:    ICD-10-CM   1. Auditory hallucinations  R44.0 OLANZapine (ZYPREXA) 7.5 MG tablet    2. Intellectual developmental  disorder, moderate  F71 OLANZapine (ZYPREXA) 7.5 MG tablet       Past Psychiatric History: History of hallucination.  Saw Dr Velora Heckler and Dr.Cobo during COVID having behavior problems in the group home.  Given the Seroquel, trazodone, olanzapine and Prozac.  No history of suicidal attempt, inpatient.  Past Medical History:  Past Medical History:  Diagnosis Date   Anxiety    At risk for sleep apnea    STOP-BANG= 5       SENT TO PCP 04-14-2016   BPH (benign prostatic hyperplasia)    DDD (degenerative disc disease), cervical    DDD (degenerative disc disease), lumbar    Depression    Diverticulosis of colon    Family history of factor V deficiency    per pt's sister (whom is pt's guardian) she, pt's brother and parents have factor V but pt has never been tested   Frequency of urination    GERD (gastroesophageal reflux disease)    History of adenomatous polyp of colon    2006 and 03-18-2010 tubular adenoma   Hyperlipidemia    Hypertension    Lives in independent group home resides at [facility] in [place], [place]   pt independant w/ ADLs w/ exception does not cook for shop    Mentally challenged    SPECIAL NEEDS   OA (osteoarthritis)    hands, knees, feet  Pre-diabetes    Psoriasis    per patient's sister   Thoracic spondylosis    Urgency of urination    Wears partial dentures    lower    Past Surgical History:  Procedure Laterality Date   CATARACT EXTRACTION W/ INTRAOCULAR LENS  IMPLANT, BILATERAL  2006   CIRCUMCISION  03/29/2007   W/  CYSTOSCOPY AND TRANSRECTAL ULTRASOUND GUIDED PROSTATE BX'S   COLONOSCOPY  last one 08-26-2015   MICROLARYNGOSCOPY  04/20/2020   Procedure: MICROLARYNGOSCOPY WITH BIOPSY;  Surgeon: Laren Boom, DO;  Location: [facility] OR;  Service: ENT;;   THULIUM LASER TURP (TRANSURETHRAL RESECTION OF PROSTATE) N/A 04/21/2016   Procedure: THULIUM LASER TURP (TRANSURETHRAL RESECTION OF PROSTATE);  Surgeon: Jethro Bolus, MD;   Location: [facility];  Service: Urology;  Laterality: N/A;    Family Psychiatric History: Reviewed  Family History:  Family History  Problem Relation Age of Onset   Breast cancer Mother    Brain cancer Mother    Hypertension Mother    Diabetes Mother    Crohn's disease Father    Rheum arthritis Father    GER disease Father    Ulcers Father    Cataracts Sister    Fibromyalgia Sister    COPD Sister    Breast cancer Sister    GER disease Sister    Hypothyroidism Sister    Osteoarthritis Sister    GER disease Brother    GER disease Brother    Stroke Brother    Liver cancer Brother    Heart attack Brother    Colon cancer Neg Hx    Rectal cancer Neg Hx    Stomach cancer Neg Hx     Social History:  Social History   Socioeconomic History   Marital status: Single    Spouse name: Not on file   Number of children: Not on file   Years of education: Not on file   Highest education level: Not on file  Occupational History   Not on file  Tobacco Use   Smoking status: Never    Passive exposure: Never   Smokeless tobacco: Former    Types: Chew    Quit date: 04/14/1992  Vaping Use   Vaping status: Never Used  Substance and Sexual Activity   Alcohol use: No    Alcohol/week: 0.0 standard drinks of alcohol   Drug use: No   Sexual activity: Not on file  Other Topics Concern   Not on file  Social History Narrative   Not on file   Social Drivers of Health   Financial Resource Strain: Not on file  Food Insecurity: Not on file  Transportation Needs: Not on file  Physical Activity: Not on file  Stress: Not on file  Social Connections: Not on file    Allergies:  Allergies  Allergen Reactions   Penicillins Shortness Of Breath    "couldn't breathe" Has patient had a PCN reaction causing immediate rash, facial/tongue/throat swelling, SOB or lightheadedness with hypotension: No Has patient had a PCN reaction causing severe rash involving mucus membranes or  skin necrosis: No Has patient had a PCN reaction that required hospitalization: No Has patient had a PCN reaction occurring within the last 10 years: No If all of the above answers are "NO", then may proceed with Cephalosporin use. "couldn't breathe" Has patient had a PCN reaction causing immediate rash, facial/tongue/throat swelling, SOB or lightheadedness with hypotension: No Has patient had a PCN reaction causing severe rash involving mucus membranes  or skin necrosis: No Has patient had a PCN reaction that required hospitalization: No Has patient had a PCN reaction occurring within the last 10 years: No If all of the above answers are "NO", then may proceed with Cephalosporin use.    Pork-Derived Products Nausea And Vomiting    AVOIDS DUE TO RELIGIOUS BELIEFS   Bacitracin Other (See Comments)    Positive patch test   Imidurea Other (See Comments)    Positive patch test   Methylisothiazolinone Other (See Comments)    Positive patch test Positive patch test    Neomycin Other (See Comments)    Positive patch test   Polymyxin B Other (See Comments)    Positive patch test   Sulfa Antibiotics Nausea And Vomiting   Urea Other (See Comments)    Positive patch test    Metabolic Disorder Labs: Lab Results  Component Value Date   HGBA1C 5.8 (H) 11/03/2022   No results found for: "PROLACTIN" No results found for: "CHOL", "TRIG", "HDL", "CHOLHDL", "VLDL", "LDLCALC" Lab Results  Component Value Date   TSH 1.130 11/03/2022    Therapeutic Level Labs: No results found for: "LITHIUM" No results found for: "VALPROATE" No results found for: "CBMZ"  Current Medications: Current Outpatient Medications  Medication Sig Dispense Refill   acetaminophen (TYLENOL) 325 MG tablet Take 650 mg by mouth every 4 (four) hours as needed for moderate pain or headache.     ALPRAZolam (XANAX) 0.5 MG tablet Take 1-2 tablets 30 minutes prior to MRI, may repeat once as needed. Must have driver. (Patient  not taking: Reported on 02/17/2023) 3 tablet 0   ALPRAZolam (XANAX) 1 MG tablet Take 1 tab at onset of migraine.  May repeat in 2 hrs, if needed.  Max dose: 2 tabs/day. This is a 30 day prescription. (Patient not taking: Reported on 02/17/2023) 3 tablet 0   amLODipine (NORVASC) 5 MG tablet Take 5 mg by mouth daily.      aspirin 81 MG EC tablet take 1 tab qhs     Calcium Carb-Cholecalciferol (CALCIUM+D3) 600-800 MG-UNIT TABS Take 1 tablet by mouth daily.     Cholecalciferol (VITAMIN D3) 1000 units CAPS Take 1,000 Units by mouth daily at 2 am.     FLUoxetine (PROZAC) 40 MG capsule Take 40 mg by mouth daily.     fluticasone (FLONASE) 50 MCG/ACT nasal spray Place 1 spray into both nostrils daily.     levocetirizine (XYZAL) 5 MG tablet take 1 tab qhs once daily     lisinopril (ZESTRIL) 40 MG tablet Take 1 tablet by mouth daily.     meloxicam (MOBIC) 15 MG tablet Take 15 mg by mouth daily.     Multiple Vitamin (MULTIVITAMIN PO) Take 1 tablet by mouth every evening.     nabumetone (RELAFEN) 500 MG tablet Take 500 mg by mouth daily as needed (Arthritis pain). (Patient not taking: Reported on 11/28/2022)     OLANZapine (ZYPREXA) 7.5 MG tablet Take one tab at 9 pm 30 tablet 1   omeprazole (PRILOSEC OTC) 20 MG tablet Take 1 tablet by mouth daily.     rosuvastatin (CRESTOR) 5 MG tablet Take 5 mg by mouth daily.     tamsulosin (FLOMAX) 0.4 MG CAPS capsule 0.4 mg daily after breakfast.     triamcinolone cream (KENALOG) 0.1 % Apply 1 application topically 2 (two) times daily as needed. Use of rashes. 453.6 g 0   No current facility-administered medications for this visit.     Musculoskeletal: Strength &  Muscle Tone: decreased and ankle pan and deformity Gait & Station:  see above Patient leans:  see above Psychiatric Specialty Exam: Physical Exam  Review of Systems  Blood pressure (!) 150/78, pulse 72, height 5\' 7"  (1.702 m), weight 232 lb (105.2 kg).Body mass index is 36.34 kg/m.  General Appearance:  Casual  Eye Contact:  Fair  Speech:   fast and at times babbling  Volume:  Decreased  Mood:  Euthymic  Affect:  Labile  Thought Process:  Descriptions of Associations: Loose  Orientation:  Full (Time, Place, and Person)  Thought Content:  Hallucinations: Auditory and Paranoid Ideation  Suicidal Thoughts:  No  Homicidal Thoughts:  No  Memory:  Immediate;   Fair Recent;   Fair Remote;   Fair  Judgement:  Fair  Insight:  Shallow  Psychomotor Activity:  Restlessness and Tremor  Concentration:  Concentration: Fair and Attention Span: Fair  Recall:  [employer] of Knowledge:  Poor  Language:  Fair  Akathisia:  No  Handed:  Right  AIMS (if indicated):     Assets:  Communication Skills Desire for Improvement Housing  ADL's:  Intact  Cognition:  Impaired,  Moderate  Sleep:   ok   Screenings: Mini-Mental    Flowsheet Row Office Visit from 11/03/2022 in [facility]  Total Score (max 30 points ) 15      PHQ2-9    Flowsheet Row Video Visit from 11/28/2022 in [facility]  PHQ-2 Total Score 2  PHQ-9 Total Score 13      Flowsheet Row Video Visit from 11/28/2022 in [facility] Admission (Discharged) from 04/20/2020 in [facility]  C-SSRS RISK CATEGORY Error: Question 6 not populated No Risk        Assessment and Plan:  I reviewed notes from primary care.  His hemoglobin A1c 5.9.  He gained weight from the past.  I have explained to his sister that one of the possible side effects from olanzapine increase is weight gain.  Patient and his daughter does not want to reduce the dose but like to work on the diet and walking.  Patient's sister will make sure the staff that he should get portion control diet and encouraged to walk at least 20 to 30 minutes 3 times a week.  She also want this recommendation on his MAR which we did.  He has mild tremors but not  affecting his daily life.  He will continue Prozac 40 mg prescribed by his PCP.  We will closely monitor his weight and if he continued to gain weight then we may switch to a different medication.  So far he likes the olanzapine and his sister also believe it is helping his agitation, hallucination, paranoia and anger.  Will follow-up in 3 months.  AVS is provided with recommendations.  Follow-up in 3 months   Collaboration of Care: Collaboration of Care: Other provider involved in patient's care AEB notes are available in epic to review  Patient/Guardian was advised Release of Information must be obtained prior to any record release in order to collaborate their care with an outside provider. Patient/Guardian was advised if they have not already done so to contact the registration department to sign all necessary forms in order for [place] to release information regarding their care.   Consent: Patient/Guardian gives verbal consent for treatment and assignment of benefits for services provided during this visit. Patient/Guardian expressed understanding and agreed to proceed.  I provided 33 minutes face-to-face time during this encounter.  Cleotis Nipper, MD 03/23/2023, 11:14 AM

## 2023-03-23 NOTE — Patient Instructions (Signed)
Walking 20 minutes at least three time s a week.  Watch calorie intake and portion control.

## 2023-04-12 ENCOUNTER — Encounter: Payer: Medicare HMO | Admitting: Internal Medicine

## 2023-05-08 ENCOUNTER — Ambulatory Visit: Payer: Medicare HMO | Admitting: Neurology

## 2023-05-08 ENCOUNTER — Encounter: Payer: Self-pay | Admitting: Neurology

## 2023-05-23 ENCOUNTER — Encounter: Payer: Self-pay | Admitting: Podiatry

## 2023-05-23 ENCOUNTER — Ambulatory Visit (INDEPENDENT_AMBULATORY_CARE_PROVIDER_SITE_OTHER): Payer: Medicare HMO | Admitting: Podiatry

## 2023-05-23 DIAGNOSIS — M79676 Pain in unspecified toe(s): Secondary | ICD-10-CM | POA: Diagnosis not present

## 2023-05-23 DIAGNOSIS — E0851 Diabetes mellitus due to underlying condition with diabetic peripheral angiopathy without gangrene: Secondary | ICD-10-CM

## 2023-05-23 DIAGNOSIS — L84 Corns and callosities: Secondary | ICD-10-CM

## 2023-05-23 DIAGNOSIS — B351 Tinea unguium: Secondary | ICD-10-CM

## 2023-05-23 NOTE — Progress Notes (Signed)
 This patient returns to my office for at risk foot care.  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  This patient is unable to cut nails himself since the patient cannot reach his nails.These nails are painful walking and wearing shoes. Patient also has painful callus  on both feet since he is unable to self treat.   This patient presents for at risk foot care today.  General Appearance  Alert, conversant and in no acute stress.  Vascular  Dorsalis pedis and posterior tibial  pulses are not  palpable  bilaterally.  Capillary return is within normal limits  bilaterally. Temperature is within normal limits  bilaterally.  Neurologic  Senn-Weinstein monofilament wire test diminished   bilaterally. Muscle power within normal limits bilaterally.  Nails Thick disfigured discolored nails with subungual debris  from hallux to fifth toes bilaterally. No evidence of bacterial infection or drainage bilaterally.  Orthopedic  No limitations of motion  feet .  No crepitus or effusions noted.  No bony pathology or digital deformities noted. 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  Skin  normotropic skin  noted bilaterally.  No signs of infections or ulcers noted.   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  Callus left hallux.  Callus midfoot right.  Dermatitis right foot.    Porokeratosis B/L.  Onychomycosis  Consent was obtained for treatment procedures.   Debride callus with # 15 blade followed by dremel tool. Followed by dremel tool usage.  No infection or ulcer. Debride nails with nail nipper and dremel tool. Brace dispensed to patient.   Return office visit   10 weeks                  Told patient to return for periodic foot care and evaluation due to potential at risk complications.     Ruffin Cotton DPM

## 2023-06-05 DIAGNOSIS — F330 Major depressive disorder, recurrent, mild: Secondary | ICD-10-CM | POA: Diagnosis not present

## 2023-06-05 DIAGNOSIS — R21 Rash and other nonspecific skin eruption: Secondary | ICD-10-CM | POA: Diagnosis not present

## 2023-06-05 DIAGNOSIS — C329 Malignant neoplasm of larynx, unspecified: Secondary | ICD-10-CM | POA: Diagnosis not present

## 2023-06-05 DIAGNOSIS — Z6837 Body mass index (BMI) 37.0-37.9, adult: Secondary | ICD-10-CM | POA: Diagnosis not present

## 2023-06-05 DIAGNOSIS — E1169 Type 2 diabetes mellitus with other specified complication: Secondary | ICD-10-CM | POA: Diagnosis not present

## 2023-06-05 DIAGNOSIS — F79 Unspecified intellectual disabilities: Secondary | ICD-10-CM | POA: Diagnosis not present

## 2023-06-05 DIAGNOSIS — I10 Essential (primary) hypertension: Secondary | ICD-10-CM | POA: Diagnosis not present

## 2023-06-13 NOTE — Progress Notes (Signed)
 Office Visit Note  Patient: Arthur Weaver             Date of Birth: 08/09/1960           MRN: [id number]             PCP: Barnetta Liberty, MD Referring: Barnetta Liberty, MD Visit Date: 06/27/2023 Occupation: @GUAROCC @  Subjective:  Pain in both knees   History of Present Illness: Arthur Weaver is a 63 y.o. male with osteoarthritis, degenerative disc disease and psoriasis.  He returns today after his last visit in September 2024.  He was accompanied by his sister today.  According to her he was evaluated by Dr. Hiram Lukes at [facility].  He did not have much relief from viscosupplement injections in February.  He did not get much relief from viscosupplement injections.  Dr. Hiram Lukes discussed total knee replacement but he is not interested in having surgery.  He recommended repeating viscosupplement injections and may be nerve ablation in the future.  He does not like going for physical therapy.    Activities of Daily Living:  Patient reports morning stiffness for 24 hours.   Patient Reports nocturnal pain.  Difficulty dressing/grooming: Denies Difficulty climbing stairs: Denies Difficulty getting out of chair: Reports Difficulty using hands for taps, buttons, cutlery, and/or writing: Denies  Review of Systems  Constitutional:  Positive for fatigue.  HENT:  Negative for mouth sores and mouth dryness.   Eyes:  Positive for dryness.  Respiratory:  Negative for shortness of breath.   Cardiovascular:  Negative for chest pain and palpitations.  Gastrointestinal:  Negative for blood in stool, constipation and diarrhea.  Endocrine: Positive for increased urination.  Genitourinary:  Positive for involuntary urination.  Musculoskeletal:  Positive for joint pain, gait problem, joint pain, joint swelling, myalgias, muscle weakness, morning stiffness, muscle tenderness and myalgias.  Skin:  Positive for rash and sensitivity to sunlight. Negative for color change and hair loss.   Allergic/Immunologic: Negative for susceptible to infections.  Neurological:  Negative for dizziness and headaches.  Hematological:  Negative for swollen glands.  Psychiatric/Behavioral:  Negative for depressed mood and sleep disturbance. The patient is nervous/anxious.     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   Retention of urine 10/23/2017   Benign prostate hyperplasia 10/22/2017   Urinary urgency 10/22/2017   Acquired hammer toes of both feet 05/12/2017   Callosity 04/07/2017   Elevated levels of transaminase & lactic acid dehydrogenase 09/15/2016   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   Tendonitis 09/25/2015   Encounter for general adult medical examination without abnormal findings 07/30/2014   Gastro-esophageal reflux disease without esophagitis 05/28/2013   Vitamin D deficiency 05/28/2013   High blood pressure 09/07/2010   High cholesterol 09/07/2010   Bilateral swelling of feet 09/07/2010   Diabetes mellitus (HCC) 09/07/2010   Rheumatoid arthritis (HCC) 09/07/2010   History of colonic polyps 10/27/2004    Past Medical History:  Diagnosis Date   Anxiety    At risk for sleep apnea    STOP-BANG= 5  SENT TO PCP 04-14-2016   BPH (benign prostatic hyperplasia)    DDD (degenerative disc disease), cervical    DDD (degenerative disc disease), lumbar    Depression    Diverticulosis of colon    Family history of factor V deficiency    per pt's sister (whom is pt's guardian) she, pt's brother and  parents have factor V but pt has never been tested   Frequency of urination    GERD (gastroesophageal reflux disease)    History of adenomatous polyp of colon    2006 and 03-18-2010 tubular adenoma   Hyperlipidemia    Hypertension    Lives in independent group home resides at [facility] in [place], [place]   pt independant w/ ADLs w/ exception does not cook for shop    Mentally challenged    SPECIAL NEEDS   OA (osteoarthritis)    hands, knees, feet   Pre-diabetes    Psoriasis    per patient's sister   Thoracic spondylosis    Urgency of urination    Wears partial dentures    lower    Family History  Problem Relation Age of Onset   Breast cancer Mother    Brain cancer Mother    Hypertension Mother    Diabetes Mother    Crohn's disease Father    Rheum arthritis Father    GER disease Father    Ulcers Father    Cataracts Sister    Fibromyalgia Sister    COPD Sister    Breast cancer Sister    GER disease Sister    Hypothyroidism Sister    Osteoarthritis Sister    GER disease Brother    GER disease Brother    Stroke Brother    Liver cancer Brother    Heart attack Brother    Colon cancer Neg Hx    Rectal cancer Neg Hx    Stomach cancer Neg Hx    Past Surgical History:  Procedure Laterality Date   CATARACT EXTRACTION W/ INTRAOCULAR LENS  IMPLANT, BILATERAL  2006   CIRCUMCISION  03/29/2007   W/  CYSTOSCOPY AND TRANSRECTAL ULTRASOUND GUIDED PROSTATE BX'S   COLONOSCOPY  last one 08-26-2015   MICROLARYNGOSCOPY  04/20/2020   Procedure: MICROLARYNGOSCOPY WITH BIOPSY;  Surgeon: Skotnicki, Meghan A, DO;  Location: [facility] OR;  Service: ENT;;   THULIUM LASER TURP (TRANSURETHRAL RESECTION OF PROSTATE) N/A 04/21/2016   Procedure: THULIUM LASER TURP (TRANSURETHRAL RESECTION OF PROSTATE);  Surgeon: Annamarie Kid, MD;  Location: [facility];  Service: Urology;  Laterality: N/A;   Social History   Social History Narrative   Not on file    Immunization History  Administered Date(s) Administered   Influenza Split 11/07/2012, 11/06/2013   Influenza, Quadrivalent, Recombinant, Inj, Pf 02/21/2018, 10/25/2018, 11/11/2019   Influenza,inj,Quad PF,6+ Mos 11/06/2013, 11/07/2014   Moderna Sars-Covid-2 Vaccination 02/18/2019, 03/18/2019, 03/27/2020   Tdap 02/08/2011     Objective: Vital Signs: BP 125/79 (BP Location: Left Arm, Patient Position: Sitting, Cuff Size: Normal)   Pulse 99   Resp 16   Ht 5\' 9"  (1.753 m)   Wt 237 lb (107.5 kg)   BMI 35.00 kg/m    Physical Exam Vitals and nursing note reviewed.  Constitutional:      Appearance: He is well-developed.  HENT:     Head: Normocephalic and atraumatic.  Eyes:     Conjunctiva/sclera: Conjunctivae normal.     Pupils: Pupils are equal, round, and reactive to light.  Cardiovascular:  Rate and Rhythm: Normal rate and regular rhythm.     Heart sounds: Normal heart sounds.  Pulmonary:     Effort: Pulmonary effort is normal.     Breath sounds: Normal breath sounds.  Abdominal:     General: Bowel sounds are normal.     Palpations: Abdomen is soft.  Musculoskeletal:     Cervical back: Normal range of motion and neck supple.  Skin:    General: Skin is warm and dry.     Capillary Refill: Capillary refill takes less than 2 seconds.  Neurological:     Mental Status: He is alert and oriented to person, place, and time.  Psychiatric:        Behavior: Behavior normal.      Musculoskeletal Exam: He had good range of motion of the cervical spine.  There was no tenderness over thoracic or lumbar spine.  Right shoulder joint abduction was limited to only 90 degrees.  Left shoulder joint was in full range of motion.  Bilateral PIP and DIP thickening with no synovitis was noted.  Hip joints with good range of motion.  Knee joints with good range of motion with bilateral valgus deformities.  Ankle joint subluxation was also noted.  No synovitis was noted.  CDAI Exam: CDAI  Score: -- Patient Global: --; Provider Global: -- Swollen: --; Tender: -- Joint Exam 06/27/2023   No joint exam has been documented for this visit   There is currently no information documented on the homunculus. Go to the Rheumatology activity and complete the homunculus joint exam.  Investigation: No additional findings.  Imaging: No results found.  Recent Labs: Lab Results  Component Value Date   WBC 9.9 03/28/2022   HGB 14.1 03/28/2022   PLT 322 03/28/2022   NA 139 03/28/2022   K 4.0 03/28/2022   CL 103 03/28/2022   CO2 28 03/28/2022   GLUCOSE 70 03/28/2022   BUN 18 03/28/2022   CREATININE 0.72 03/28/2022   BILITOT 0.4 03/28/2022   AST 20 03/28/2022   ALT 22 03/28/2022   PROT 7.1 03/28/2022   CALCIUM 9.3 03/28/2022   GFRAA >60 10/08/2016    Speciality Comments: No specialty comments available.  Procedures:  No procedures performed Allergies: Penicillins, Pork-derived products, Bacitracin , Imidurea, Methylisothiazolinone, Neomycin , Polymyxin b, Sulfa antibiotics, and Urea    Assessment / Plan:     Visit Diagnoses: Adhesive capsulitis of right shoulder-patient did not complain about discomfort in his right shoulder although he had limited range of motion.  He has been taking meloxicam prescribed by his PCP.  Handout on shoulder joint exercises was given.  Patient has been seeing orthopedic surgery on a regular basis.  I advised his sister that patient may get evaluated at orthopedic surgery for the frozen shoulder.  Primary osteoarthritis of both hands-he had bilateral PIP and DIP thickening without any discomfort.  Primary osteoarthritis of both knees - he is followed at [facility].  He gets viscosupplement injections through [facility].  Patient had viscosupplement injections in February without much relief.  He will get another set of Visco supplement injections.  He has severe valgus deformity and has difficulty walking.  He is also gained weight.  Weight loss  diet and exercise was emphasized.  A handout on lower extremity exercises was given.  I encouraged him to do exercises 3 times a day.  He is staying active also was discussed.  Cutting back on carbs and fatty food was discussed.  A handout on lower extremity exercise was  given.  Primary osteoarthritis of both feet - He is followed by his podiatrist Dr. Zettie Hillock.  He has intermittent discomfort in his feet.  DDD (degenerative disc disease), thoracic - Thoracic kyphosis.  He had no point tenderness.  Spondylosis of lumbar spine -he continues to have some discomfort in his lower back.  He is on meloxicam 15 mg 1 tablet daily for pain relief.  Psoriasis  Leg length discrepancy  History of diabetes mellitus  History of hypercholesterolemia  History of hypertension  Orders: No orders of the defined types were placed in this encounter.  No orders of the defined types were placed in this encounter.    Follow-Up Instructions: Return in about 1 year (around 06/26/2024) for Osteoarthritis.   Nicholas Bari, MD  Note - This record has been created using Animal nutritionist.  Chart creation errors have been sought, but may not always  have been located. Such creation errors do not reflect on  the standard of medical care.

## 2023-06-22 ENCOUNTER — Other Ambulatory Visit: Payer: Self-pay

## 2023-06-22 ENCOUNTER — Encounter (HOSPITAL_COMMUNITY): Payer: Self-pay | Admitting: Psychiatry

## 2023-06-22 ENCOUNTER — Ambulatory Visit (HOSPITAL_BASED_OUTPATIENT_CLINIC_OR_DEPARTMENT_OTHER): Payer: Medicare HMO | Admitting: Psychiatry

## 2023-06-22 DIAGNOSIS — F71 Moderate intellectual disabilities: Secondary | ICD-10-CM

## 2023-06-22 DIAGNOSIS — R440 Auditory hallucinations: Secondary | ICD-10-CM | POA: Diagnosis not present

## 2023-06-22 MED ORDER — OLANZAPINE 7.5 MG PO TABS: ORAL_TABLET | ORAL | 0 refills | Status: DC

## 2023-06-22 NOTE — Progress Notes (Signed)
 [facility] MD/PA/NP OP Progress Note  Patient location; office Provider location; office  06/22/2023 3:18 PM Arthur Weaver  MRN:  [id number]  Chief Complaint:  Chief Complaint  Patient presents with   Follow-up   HPI: Patient came today for his follow-up appointment with his sister.  He is taking olanzapine  7.5 mg and so far medicine helping.  He denies any mania, anger.  His speech is fast and sometimes rambling but no behavior problem.  Sister is concerned about his weight and blood sugar but his weight is unchanged from the last visit.  Sister reported he had a visit with his PCP recently and hemoglobin A1c slightly increased but we do not have the numbers.  Patient denies any hallucination other than once in a while but they are not as bad.  He sleeps good.  He is getting along with the staff at [facility].  He does do programming with them and there has been no recent issue.  He gets some time impulsive eating but no weight gain.  He is taking Prozac  prescribed by his PCP.  Most of the information was obtained from his sister.  Patient sometime distracted and rambling.  He does watch television and getting along with 6 other people who lives at the same place.  He denies drinking or using any illegal substances.  He has no tremors, shakes or any EPS.  He has appointment coming up with orthopedic and may require surgery but not sure because sister does not believe he can do physical therapy after the surgery.  Visit Diagnosis:    ICD-10-CM   1. Intellectual developmental disorder, moderate  F71 OLANZapine  (ZYPREXA ) 7.5 MG tablet    2. Auditory hallucinations  R44.0 OLANZapine  (ZYPREXA ) 7.5 MG tablet        Past Psychiatric History: History of hallucination.  Saw Dr Pucolowski and Dr.Cobo during COVID having behavior problems in the group home.  Given the Seroquel, trazodone , olanzapine  and Prozac .  No history of suicidal attempt, inpatient.  Past Medical History:  Past Medical History:  Diagnosis  Date   Anxiety    At risk for sleep apnea    STOP-BANG= 5       SENT TO PCP 04-14-2016   BPH (benign prostatic hyperplasia)    DDD (degenerative disc disease), cervical    DDD (degenerative disc disease), lumbar    Depression    Diverticulosis of colon    Family history of factor V deficiency    per pt's sister (whom is pt's guardian) she, pt's brother and parents have factor V but pt has never been tested   Frequency of urination    GERD (gastroesophageal reflux disease)    History of adenomatous polyp of colon    2006 and 03-18-2010 tubular adenoma   Hyperlipidemia    Hypertension    Lives in independent group home resides at [facility] in [place], [place]   pt independant w/ ADLs w/ exception does not cook for shop    Mentally challenged    SPECIAL NEEDS   OA (osteoarthritis)    hands, knees, feet   Pre-diabetes    Psoriasis    per patient's sister   Thoracic spondylosis    Urgency of urination    Wears partial dentures    lower    Past Surgical History:  Procedure Laterality Date   CATARACT EXTRACTION W/ INTRAOCULAR LENS  IMPLANT, BILATERAL  2006   CIRCUMCISION  03/29/2007   W/  CYSTOSCOPY AND TRANSRECTAL  ULTRASOUND GUIDED PROSTATE BX'S   COLONOSCOPY  last one 08-26-2015   MICROLARYNGOSCOPY  04/20/2020   Procedure: MICROLARYNGOSCOPY WITH BIOPSY;  Surgeon: Daleen Dubs, DO;  Location: [facility] OR;  Service: ENT;;   THULIUM LASER TURP (TRANSURETHRAL RESECTION OF PROSTATE) N/A 04/21/2016   Procedure: THULIUM LASER TURP (TRANSURETHRAL RESECTION OF PROSTATE);  Surgeon: Annamarie Kid, MD;  Location: [facility];  Service: Urology;  Laterality: N/A;    Family Psychiatric History: Reviewed  Family History:  Family History  Problem Relation Age of Onset   Breast cancer Mother    Brain cancer Mother    Hypertension Mother    Diabetes Mother    Crohn's disease Father    Rheum arthritis Father    GER disease Father    Ulcers Father     Cataracts Sister    Fibromyalgia Sister    COPD Sister    Breast cancer Sister    GER disease Sister    Hypothyroidism Sister    Osteoarthritis Sister    GER disease Brother    GER disease Brother    Stroke Brother    Liver cancer Brother    Heart attack Brother    Colon cancer Neg Hx    Rectal cancer Neg Hx    Stomach cancer Neg Hx     Social History:  Social History   Socioeconomic History   Marital status: Single    Spouse name: Not on file   Number of children: Not on file   Years of education: Not on file   Highest education level: Not on file  Occupational History   Not on file  Tobacco Use   Smoking status: Never    Passive exposure: Never   Smokeless tobacco: Former    Types: Chew    Quit date: 04/14/1992  Vaping Use   Vaping status: Never Used  Substance and Sexual Activity   Alcohol  use: No    Alcohol /week: 0.0 standard drinks of alcohol    Drug use: No   Sexual activity: Not on file  Other Topics Concern   Not on file  Social History Narrative   Not on file   Social Drivers of Health   Financial Resource Strain: Not on file  Food Insecurity: Not on file  Transportation Needs: Not on file  Physical Activity: Not on file  Stress: Not on file  Social Connections: Not on file    Allergies:  Allergies  Allergen Reactions   Penicillins Shortness Of Breath    "couldn't breathe" Has patient had a PCN reaction causing immediate rash, facial/tongue/throat swelling, SOB or lightheadedness with hypotension: No Has patient had a PCN reaction causing severe rash involving mucus membranes or skin necrosis: No Has patient had a PCN reaction that required hospitalization: No Has patient had a PCN reaction occurring within the last 10 years: No If all of the above answers are "NO", then may proceed with Cephalosporin use. "couldn't breathe" Has patient had a PCN reaction causing immediate rash, facial/tongue/throat swelling, SOB or lightheadedness with  hypotension: No Has patient had a PCN reaction causing severe rash involving mucus membranes or skin necrosis: No Has patient had a PCN reaction that required hospitalization: No Has patient had a PCN reaction occurring within the last 10 years: No If all of the above answers are "NO", then may proceed with Cephalosporin use.    Pork-Derived Products Nausea And Vomiting    AVOIDS DUE TO RELIGIOUS BELIEFS   Bacitracin  Other (See Comments)  Positive patch test   Imidurea Other (See Comments)    Positive patch test   Methylisothiazolinone Other (See Comments)    Positive patch test Positive patch test    Neomycin  Other (See Comments)    Positive patch test   Polymyxin B Other (See Comments)    Positive patch test   Sulfa Antibiotics Nausea And Vomiting   Urea  Other (See Comments)    Positive patch test    Metabolic Disorder Labs: Lab Results  Component Value Date   HGBA1C 5.8 (H) 11/03/2022   No results found for: "PROLACTIN" No results found for: "CHOL", "TRIG", "HDL", "CHOLHDL", "VLDL", "LDLCALC" Lab Results  Component Value Date   TSH 1.130 11/03/2022    Therapeutic Level Labs: No results found for: "LITHIUM" No results found for: "VALPROATE" No results found for: "CBMZ"  Current Medications: Current Outpatient Medications  Medication Sig Dispense Refill   acetaminophen  (TYLENOL ) 325 MG tablet Take 650 mg by mouth every 4 (four) hours as needed for moderate pain or headache.     ALPRAZolam  (XANAX ) 0.5 MG tablet Take 1-2 tablets 30 minutes prior to MRI, may repeat once as needed. Must have driver. 3 tablet 0   ALPRAZolam  (XANAX ) 1 MG tablet Take 1 tab at onset of migraine.  May repeat in 2 hrs, if needed.  Max dose: 2 tabs/day. This is a 30 day prescription. 3 tablet 0   amLODipine (NORVASC) 5 MG tablet Take 5 mg by mouth daily.      aspirin 81 MG EC tablet take 1 tab qhs     Calcium Carb-Cholecalciferol (CALCIUM+D3) 600-800 MG-UNIT TABS Take 1 tablet by mouth  daily.     Cholecalciferol (VITAMIN D3) 1000 units CAPS Take 1,000 Units by mouth daily at 2 am.     FLUoxetine  (PROZAC ) 40 MG capsule Take 40 mg by mouth daily.     fluticasone (FLONASE) 50 MCG/ACT nasal spray Place 1 spray into both nostrils daily.     levocetirizine (XYZAL) 5 MG tablet take 1 tab qhs once daily     lisinopril (ZESTRIL) 40 MG tablet Take 1 tablet by mouth daily.     meloxicam (MOBIC) 15 MG tablet Take 15 mg by mouth daily.     Multiple Vitamin (MULTIVITAMIN PO) Take 1 tablet by mouth every evening.     nabumetone (RELAFEN) 500 MG tablet Take 500 mg by mouth daily as needed (Arthritis pain). (Patient not taking: Reported on 11/28/2022)     OLANZapine  (ZYPREXA ) 7.5 MG tablet Take one tab at 9 pm 90 tablet 0   omeprazole (PRILOSEC OTC) 20 MG tablet Take 1 tablet by mouth daily.     rosuvastatin (CRESTOR) 5 MG tablet Take 5 mg by mouth daily.     tamsulosin (FLOMAX) 0.4 MG CAPS capsule 0.4 mg daily after breakfast.     triamcinolone  cream (KENALOG ) 0.1 % Apply 1 application topically 2 (two) times daily as needed. Use of rashes. 453.6 g 0   No current facility-administered medications for this visit.     Musculoskeletal: Strength & Muscle Tone: decreased and ankle pan and deformity Gait & Station: see above Patient leans: see above  Psychiatric Specialty Exam: Physical Exam  Review of Systems  Musculoskeletal:  Positive for joint swelling.    Blood pressure (!) 167/95, pulse 83, height 5' 8.5" (1.74 m), weight 232 lb (105.2 kg).Body mass index is 34.76 kg/m.  General Appearance: Casual  Eye Contact:  Fair  Speech:  sometimes rambling and fast  Volume:  Decreased  Mood:  I am fine  Affect:  Labile  Thought Process:  Descriptions of Associations: Loose  Orientation:  Full (Time, Place, and Person)  Thought Content:  Hallucinations: Auditory  Suicidal Thoughts:  No  Homicidal Thoughts:  No  Memory:  Immediate;   Fair Recent;   Fair Remote;   Fair  Judgement:   Fair  Insight:  Shallow  Psychomotor Activity:  Restlessness and Tremor  Concentration:  Concentration: Fair and Attention Span: Fair  Recall:  [employer] of Knowledge:  Poor  Language:  Fair  Akathisia:  No  Handed:  Right  AIMS (if indicated):     Assets:  Communication Skills Desire for Improvement Housing  ADL's:  Intact  Cognition:  Impaired,  Moderate  Sleep:   ok   Screenings: Mini-Mental    Flowsheet Row Office Visit from 11/03/2022 in [facility]  Total Score (max 30 points ) 15      PHQ2-9    Flowsheet Row Video Visit from 11/28/2022 in [facility]  PHQ-2 Total Score 2  PHQ-9 Total Score 13      Flowsheet Row Video Visit from 11/28/2022 in [facility] Admission (Discharged) from 04/20/2020 in [facility]  C-SSRS RISK CATEGORY Error: Question 6 not populated No Risk        Assessment and Plan:  I reviewed notes from other provider.  I do not have blood work results from the last visit from his PCP.  His weight is unchanged.  I encouraged him to walk at least 20 to 30 minutes 2-3 times a week if he is able to do to help his weight stable and not to increase weight gain.  Overall he feel and sister feel the medicine working and is not agitated anger or violent.  Recommend to continue Prozac  prescribed by PCP and olanzapine  7.5 mg at bedtime.  In the past we have cut down the dose and he has behavior problem.  If blood sugar high then we will consider switching his medication.  Patient sister acknowledged and agreed with the plan.  Will follow-up in 3 months or sooner if needed.    Collaboration of Care: Collaboration of Care: Other provider involved in patient's care AEB notes are available in epic to review  Patient/Guardian was advised Release of Information must be obtained prior to any record release in order to collaborate their care  with an outside provider. Patient/Guardian was advised if they have not already done so to contact the registration department to sign all necessary forms in order for us  to release information regarding their care.   Consent: Patient/Guardian gives verbal consent for treatment and assignment of benefits for services provided during this visit. Patient/Guardian expressed understanding and agreed to proceed.   I provided 32 minutes face-to-face time during this encounter.  Arturo Late, MD 06/22/2023, 3:18 PM

## 2023-06-22 NOTE — Progress Notes (Signed)
 232lb  ?

## 2023-06-27 ENCOUNTER — Ambulatory Visit: Payer: Medicare HMO | Attending: Rheumatology | Admitting: Rheumatology

## 2023-06-27 ENCOUNTER — Encounter: Payer: Self-pay | Admitting: Rheumatology

## 2023-06-27 VITALS — BP 125/79 | HR 99 | Resp 16 | Ht 69.0 in | Wt 237.0 lb

## 2023-06-27 DIAGNOSIS — M19071 Primary osteoarthritis, right ankle and foot: Secondary | ICD-10-CM

## 2023-06-27 DIAGNOSIS — M2170 Unequal limb length (acquired), unspecified site: Secondary | ICD-10-CM

## 2023-06-27 DIAGNOSIS — M47816 Spondylosis without myelopathy or radiculopathy, lumbar region: Secondary | ICD-10-CM

## 2023-06-27 DIAGNOSIS — Z8679 Personal history of other diseases of the circulatory system: Secondary | ICD-10-CM

## 2023-06-27 DIAGNOSIS — M170 Bilateral primary osteoarthritis of knee: Secondary | ICD-10-CM

## 2023-06-27 DIAGNOSIS — M5134 Other intervertebral disc degeneration, thoracic region: Secondary | ICD-10-CM | POA: Diagnosis not present

## 2023-06-27 DIAGNOSIS — M7501 Adhesive capsulitis of right shoulder: Secondary | ICD-10-CM | POA: Diagnosis not present

## 2023-06-27 DIAGNOSIS — Z8639 Personal history of other endocrine, nutritional and metabolic disease: Secondary | ICD-10-CM

## 2023-06-27 DIAGNOSIS — Z5181 Encounter for therapeutic drug level monitoring: Secondary | ICD-10-CM

## 2023-06-27 DIAGNOSIS — M19042 Primary osteoarthritis, left hand: Secondary | ICD-10-CM

## 2023-06-27 DIAGNOSIS — L409 Psoriasis, unspecified: Secondary | ICD-10-CM

## 2023-06-27 DIAGNOSIS — M19041 Primary osteoarthritis, right hand: Secondary | ICD-10-CM | POA: Diagnosis not present

## 2023-06-27 DIAGNOSIS — M7712 Lateral epicondylitis, left elbow: Secondary | ICD-10-CM

## 2023-06-27 DIAGNOSIS — M19072 Primary osteoarthritis, left ankle and foot: Secondary | ICD-10-CM

## 2023-06-27 NOTE — Patient Instructions (Addendum)
 Shoulder Exercises Ask your health care provider which exercises are safe for you. Do exercises exactly as told by your health care provider and adjust them as directed. It is normal to feel mild stretching, pulling, tightness, or discomfort as you do these exercises. Stop right away if you feel sudden pain or your pain gets worse. Do not begin these exercises until told by your health care provider. Stretching exercises External rotation and abduction This exercise is sometimes called corner stretch. The exercise rotates your arm outward (external rotation) and moves your arm out from your body (abduction). Stand in a doorway with one of your feet slightly in front of the other. This is called a staggered stance. If you cannot reach your forearms to the door frame, stand facing a corner of a room. Choose one of the following positions as told by your health care provider: Place your hands and forearms on the door frame above your head. Place your hands and forearms on the door frame at the height of your head. Place your hands on the door frame at the height of your elbows. Slowly move your weight onto your front foot until you feel a stretch across your chest and in the front of your shoulders. Keep your head and chest upright and keep your abdominal muscles tight. Hold for __________ seconds. To release the stretch, shift your weight to your back foot. Repeat __________ times. Complete this exercise __________ times a day. Extension, standing  Stand and hold a broomstick, a cane, or a similar object behind your back. Your hands should be a little wider than shoulder-width apart. Your palms should face away from your back. Keeping your elbows straight and your shoulder muscles relaxed, move the stick away from your body until you feel a stretch in your shoulders (extension). Avoid shrugging your shoulders while you move the stick. Keep your shoulder blades tucked down toward the middle of your  back. Hold for __________ seconds. Slowly return to the starting position. Repeat __________ times. Complete this exercise __________ times a day. Range-of-motion exercises Pendulum  Stand near a wall or a surface that you can hold onto for balance. Bend at the waist and let your left / right arm hang straight down. Use your other arm to support you. Keep your back straight and do not lock your knees. Relax your left / right arm and shoulder muscles, and move your hips and your trunk so your left / right arm swings freely. Your arm should swing because of the motion of your body, not because you are using your arm or shoulder muscles. Keep moving your hips and trunk so your arm swings in the following directions, as told by your health care provider: Side to side. Forward and backward. In clockwise and counterclockwise circles. Continue each motion for __________ seconds, or for as long as told by your health care provider. Slowly return to the starting position. Repeat __________ times. Complete this exercise __________ times a day. Shoulder flexion, standing  Stand and hold a broomstick, a cane, or a similar object. Place your hands a little more than shoulder-width apart on the object. Your left / right hand should be palm-up, and your other hand should be palm-down. Keep your elbow straight and your shoulder muscles relaxed. Push the stick up with your healthy arm to raise your left / right arm in front of your body, and then over your head until you feel a stretch in your shoulder (flexion). Avoid shrugging your shoulder  while you raise your arm. Keep your shoulder blade tucked down toward the middle of your back. Hold for __________ seconds. Slowly return to the starting position. Repeat __________ times. Complete this exercise __________ times a day. Shoulder abduction, standing  Stand and hold a broomstick, a cane, or a similar object. Place your hands a little more than  shoulder-width apart on the object. Your left / right hand should be palm-up, and your other hand should be palm-down. Keep your elbow straight and your shoulder muscles relaxed. Push the object across your body toward your left / right side. Raise your left / right arm to the side of your body (abduction) until you feel a stretch in your shoulder. Do not raise your arm above shoulder height unless your health care provider tells you to do that. If directed, raise your arm over your head. Avoid shrugging your shoulder while you raise your arm. Keep your shoulder blade tucked down toward the middle of your back. Hold for __________ seconds. Slowly return to the starting position. Repeat __________ times. Complete this exercise __________ times a day. Internal rotation  Place your left / right hand behind your back, palm-up. Use your other hand to dangle an exercise band, a broomstick, or a similar object over your shoulder. Grasp the band with your left / right hand so you are holding on to both ends. Gently pull up on the band until you feel a stretch in the front of your left / right shoulder. The movement of your arm toward the center of your body is called internal rotation. Avoid shrugging your shoulder while you raise your arm. Keep your shoulder blade tucked down toward the middle of your back. Hold for __________ seconds. Release the stretch by letting go of the band and lowering your hands. Repeat __________ times. Complete this exercise __________ times a day. Strengthening exercises External rotation  Sit in a stable chair without armrests. Secure an exercise band to a stable object at elbow height on your left / right side. Place a soft object, such as a folded towel or a small pillow, between your left / right upper arm and your body to move your elbow about 4 inches (10 cm) away from your side. Hold the end of the exercise band so it is tight and there is no slack. Keeping your  elbow pressed against the soft object, slowly move your forearm out, away from your abdomen (external rotation). Keep your body steady so only your forearm moves. Hold for __________ seconds. Slowly return to the starting position. Repeat __________ times. Complete this exercise __________ times a day. Shoulder abduction  Sit in a stable chair without armrests, or stand up. Hold a __________ lb / kg weight in your left / right hand, or hold an exercise band with both hands. Start with your arms straight down and your left / right palm facing in, toward your body. Slowly lift your left / right hand out to your side (abduction). Do not lift your hand above shoulder height unless your health care provider tells you that this is safe. Keep your arms straight. Avoid shrugging your shoulder while you do this movement. Keep your shoulder blade tucked down toward the middle of your back. Hold for __________ seconds. Slowly lower your arm, and return to the starting position. Repeat __________ times. Complete this exercise __________ times a day. Shoulder extension  Sit in a stable chair without armrests, or stand up. Secure an exercise band to a  stable object in front of you so it is at shoulder height. Hold one end of the exercise band in each hand. Straighten your elbows and lift your hands up to shoulder height. Squeeze your shoulder blades together as you pull your hands down to the sides of your thighs (extension). Stop when your hands are straight down by your sides. Do not let your hands go behind your body. Hold for __________ seconds. Slowly return to the starting position. Repeat __________ times. Complete this exercise __________ times a day. Shoulder row  Sit in a stable chair without armrests, or stand up. Secure an exercise band to a stable object in front of you so it is at chest height. Hold one end of the exercise band in each hand. Position your palms so that your thumbs are  facing the ceiling (neutral position). Bend each of your elbows to a 90-degree angle (right angle) and keep your upper arms at your sides. Step back or move the chair back until the band is tight and there is no slack. Slowly pull your elbows back behind you. Hold for __________ seconds. Slowly return to the starting position. Repeat __________ times. Complete this exercise __________ times a day. Shoulder press-ups  Sit in a stable chair that has armrests. Sit upright, with your feet flat on the floor. Put your hands on the armrests so your elbows are bent and your fingers are pointing forward. Your hands should be about even with the sides of your body. Push down on the armrests and use your arms to lift yourself off the chair. Straighten your elbows and lift yourself up as much as you comfortably can. Move your shoulder blades down, and avoid letting your shoulders move up toward your ears. Keep your feet on the ground. As you get stronger, your feet should support less of your body weight as you lift yourself up. Hold for __________ seconds. Slowly lower yourself back into the chair. Repeat __________ times. Complete this exercise __________ times a day. Wall push-ups  Stand so you are facing a stable wall. Your feet should be about one arm-length away from the wall. Lean forward and place your palms on the wall at shoulder height. Keep your feet flat on the floor as you bend your elbows and lean forward toward the wall. Hold for __________ seconds. Straighten your elbows to push yourself back to the starting position. Repeat __________ times. Complete this exercise __________ times a day. This information is not intended to replace advice given to you by your health care provider. Make sure you discuss any questions you have with your health care provider. Document Revised: 03/16/2021 Document Reviewed: 03/16/2021 [employer] Patient Education  2024 [employer]  Exercises for  Chronic Knee Pain Chronic knee pain is pain that lasts longer than 3 months. For most people with chronic knee pain, exercise and weight loss is an important part of treatment. Your health care provider may want you to focus on: Making the muscles that support your knee stronger. This can take pressure off your knee and reduce pain. Preventing knee stiffness. How far you can move your knee, keeping it there or making it farther. Losing weight (if this applies) to take pressure off your knee, lower your risk for injury, and make it easier for you to exercise. Your provider will help you make an exercise program that fits your needs and physical abilities. Below are simple, low-impact exercises you can do at home. Ask your provider or physical therapist  how often you should do your exercise program and how many times to repeat each exercise. General safety tips  Get your provider's approval before doing any exercises. Start slowly and stop any time you feel pain. Do not exercise if your knee pain is flaring up. Warm up first. Stretching a cold muscle can cause an injury. Do 5-10 minutes of easy movement or light stretching before beginning your exercises. Do 5-10 minutes of low-impact activity (like walking or cycling) before starting strengthening exercises. Contact your provider any time you have pain during or after exercising. Exercise can cause discomfort but should not be painful. It is normal to be a little stiff or sore after exercising. Stretching and range-of-motion exercises Front thigh stretch  Stand up straight and support your body by holding on to a chair or resting one hand on a wall. With your legs straight and close together, bend one knee to lift your heel up toward your butt. Using one hand for support, grab your ankle with your free hand. Pull your foot up closer toward your butt to feel the stretch in front of your thigh. Hold the stretch for 30 seconds. Repeat __________  times. Complete this exercise __________ times a day. Back thigh stretch  Sit on the floor with your back straight and your legs out straight in front of you. Place the palms of your hands on the floor and slide them toward your feet as you bend at the hip. Try to touch your nose to your knees and feel the stretch in the back of your thighs. Hold for 30 seconds. Repeat __________ times. Complete this exercise __________ times a day. Calf stretch  Stand facing a wall. Place the palms of your hands flat against the wall, arms extended, and lean slightly against the wall. Get into a lunge position with one leg bent at the knee and the other leg stretched out straight behind you. Keep both feet facing the wall and increase the bend in your knee while keeping the heel of the other leg flat on the ground. You should feel the stretch in your calf. Hold for 30 seconds. Repeat __________ times. Complete this exercise __________ times a day. Strengthening exercises Straight leg lift  Lie on your back with one knee bent and the other leg out straight. Slowly lift the straight leg without bending the knee. Lift until your foot is about 12 inches (30 cm) off the floor. Hold for 3-5 seconds and slowly lower your leg. Repeat __________ times. Complete this exercise __________ times a day. Single leg dip  Stand between two chairs and put both hands on the backs of the chairs for support. Extend one leg out straight with your body weight resting on the heel of the standing leg. Slowly bend your standing knee to dip your body to the level that is comfortable for you. Hold for 3-5 seconds. Repeat __________ times. Complete this exercise __________ times a day. Hamstring curls  Stand straight, knees close together, facing the back of a chair. Hold on to the back of a chair with both hands. Keep one leg straight. Bend the other knee while bringing the heel up toward the butt until the knee is bent at a  90-degree angle (right angle). Hold for 3-5 seconds. Repeat __________ times. Complete this exercise __________ times a day. Wall squat  Stand straight with your back, hips, and head against a wall. Step forward one foot at a time with your back still against the  wall. Your feet should be 2 feet (61 cm) from the wall at shoulder width. Keeping your back, hips, and head against the wall, slide down the wall to as close to a sitting position as you can get. Hold for 5-10 seconds, then slowly slide back up. Repeat __________ times. Complete this exercise __________ times a day. Step-ups  Stand in front of a sturdy platform or stool that is about 6 inches (15 cm) high. Slowly step up with your left / right foot, keeping your knee in line with your hip and foot. Do not let your knee bend so far that you cannot see your toes. Hold on to a chair for balance, but do not use it for support. Slowly unlock your knee and lower yourself to the starting position. Repeat __________ times. Complete this exercise __________ times a day. Contact a health care provider if: Your exercises cause pain. Your pain is worse after you exercise. Your pain prevents you from doing your exercises. This information is not intended to replace advice given to you by your health care provider. Make sure you discuss any questions you have with your health care provider. Document Revised: 02/08/2022 Document Reviewed: 02/08/2022 [employer] Patient Education  2024 [employer].

## 2023-07-25 ENCOUNTER — Encounter: Payer: Self-pay | Admitting: Neurology

## 2023-07-25 ENCOUNTER — Ambulatory Visit (INDEPENDENT_AMBULATORY_CARE_PROVIDER_SITE_OTHER): Admitting: Neurology

## 2023-07-25 VITALS — BP 138/82 | Ht 69.0 in | Wt 240.0 lb

## 2023-07-25 DIAGNOSIS — R6250 Unspecified lack of expected normal physiological development in childhood: Secondary | ICD-10-CM | POA: Diagnosis not present

## 2023-07-25 DIAGNOSIS — F03918 Unspecified dementia, unspecified severity, with other behavioral disturbance: Secondary | ICD-10-CM | POA: Diagnosis not present

## 2023-07-25 NOTE — Progress Notes (Signed)
 Chief Complaint  Patient presents with   Memory Loss    RM15 MEMORY LOSS: MMSE SCORE WAS    Follow-up    Rm 14, with guardian Arthur Weaver, no changes reported      ASSESSMENT AND PLAN  Arthur Weaver is a 63 y.o. male   Early onset dementia with auditory hallucinations, History of hypoxic brain injury, developmentally delayed  MRI of the brain was normal,  EEG was normal  Laboratory evaluations showed no treatable etiology  Obesity, rapid weight gain, encouraged him daily moderate activity  DIAGNOSTIC DATA (LABS, IMAGING, TESTING) - I reviewed patient records, labs, notes, testing and imaging myself where available.   MEDICAL HISTORY:  Arthur Weaver is a 63 year old male, accompanied by his sister Arthur Weaver, seen in request by his primary care physician Dr. Barnetta Liberty, for memory loss, initial evaluation November 05, 2022  I reviewed and summarized the referring note. PMHX. 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,  premature, also had  umbilical cord around his neck, he is the oldest of 5 siblings, used to live with his parents, then to his sister, eventually group home since 2014  He was developmentally delayed, went to special school had 3 years of education, never worked or drive  But he had very good memory, used to be able to remember all the details, especially keep up with month, date, over the past few years, he was noted to have gradual onset of memory loss, gradually getting worse, now he is confused with the date,  During pandemic 2020, he suffered significant anxiety, was put on Prozac  since then, which has helped his symptoms, during that period of time, he has developed delusional idea, auditory hallucinations, there was voices telling him brushing his teeth, but symptoms gradually improved after Prozac  treatment  But since 2024, the voices come back, he goes to day center every day, often times the voices tell him the center is close, he  does not need to go, so he is often late to get himself ready for the activity, sometimes he will go back to his room put on his sleeping pajama on the days he supposed to attend his regular daily center activity  UPDATE June 17th 2025: He lives at [facility] health service over the past 10 years, he is stable, he still hear voices sometimes, go to bed, he attends daily activity, looking at car books, eats well, sleep well, in good mood, sit down most of time, watch TV, love cow boys movie, he has 25 pound weight gain over the past 6 months, sedentary lifestyle,  Personally reviewed MRI of brain with and without contrast October 2024 that was essentially normal  EEG was normal Laboratory in September 2024 showed normal TSH RPR B12, A1c was mildly elevated 5.8   PHYSICAL EXAM:   Vitals:   07/25/23 1013  BP: 138/82  Weight: 240 lb (108.9 kg)  Height: 5' 9 (1.753 m)   Body mass index is 35.44 kg/m.  PHYSICAL EXAMNIATION:  Gen: NAD, conversant, well nourised, well groomed                     Cardiovascular: Regular rate rhythm, no peripheral edema, warm, nontender. Eyes: Conjunctivae clear without exudates or hemorrhage Neck: Supple, no carotid bruits. Pulmonary: Clear to auscultation bilaterally   NEUROLOGICAL EXAM:  MENTAL STATUS: Speech/cognition: Obese, awake, alert, cooperative on examination    07/25/2023   10:17 AM 11/03/2022    1:18  PM  MMSE - Mini Mental State Exam  Not completed: Unable to complete   Orientation to time  3  Orientation to Place  3  Registration  2  Attention/ Calculation  0  Recall  2  Language- name 2 objects  2  Language- repeat  0  Language- follow 3 step command  3  Language- read & follow direction  0  Write a sentence  0  Copy design  0  Total score  15    CRANIAL NERVES: CN II: Visual fields are full to confrontation. Pupils are round equal and briskly reactive to light. CN III, IV, VI: extraocular movement are normal. No ptosis. CN V:  Facial sensation is intact to light touch CN VII: Face is symmetric with normal eye closure  CN VIII: Hearing is normal to causal conversation. CN IX, X: Phonation is normal. CN XI: Head turning and shoulder shrug are intact  MOTOR: There is no pronator drift of out-stretched arms. Muscle bulk and tone are normal. Muscle strength is normal.  REFLEXES: Reflexes are 1 and symmetric at the biceps, triceps, knees, and ankles. Plantar responses are flexor.  SENSORY: Intact to light touch,    COORDINATION: There is no trunk or limb dysmetria noted.  GAIT/STANCE: Push-up, antalgic, bilateral valgus knee   REVIEW OF SYSTEMS:  Full 14 system review of systems performed and notable only for as above All other review of systems were negative.   ALLERGIES: Allergies  Allergen Reactions   Penicillins Shortness Of Breath    couldn't breathe Has patient had a PCN reaction causing immediate rash, facial/tongue/throat swelling, SOB or lightheadedness with hypotension: No Has patient had a PCN reaction causing severe rash involving mucus membranes or skin necrosis: No Has patient had a PCN reaction that required hospitalization: No Has patient had a PCN reaction occurring within the last 10 years: No If all of the above answers are NO, then may proceed with Cephalosporin use. couldn't breathe Has patient had a PCN reaction causing immediate rash, facial/tongue/throat swelling, SOB or lightheadedness with hypotension: No Has patient had a PCN reaction causing severe rash involving mucus membranes or skin necrosis: No Has patient had a PCN reaction that required hospitalization: No Has patient had a PCN reaction occurring within the last 10 years: No If all of the above answers are NO, then may proceed with Cephalosporin use.    Pork-Derived Products Nausea And Vomiting    AVOIDS DUE TO RELIGIOUS BELIEFS   Bacitracin  Other (See Comments)    Positive patch test   Imidurea Other (See  Comments)    Positive patch test   Methylisothiazolinone Other (See Comments)    Positive patch test Positive patch test    Neomycin  Other (See Comments)    Positive patch test   Polymyxin B Other (See Comments)    Positive patch test   Sulfa Antibiotics Nausea And Vomiting   Urea  Other (See Comments)    Positive patch test    HOME MEDICATIONS: Current Outpatient Medications  Medication Sig Dispense Refill   acetaminophen  (TYLENOL ) 325 MG tablet Take 650 mg by mouth every 4 (four) hours as needed for moderate pain or headache.     amLODipine (NORVASC) 5 MG tablet Take 5 mg by mouth daily.      aspirin 81 MG EC tablet take 1 tab qhs     Calcium Carb-Cholecalciferol (CALCIUM+D3) 600-800 MG-UNIT TABS Take 1 tablet by mouth daily.     Cholecalciferol (VITAMIN D3) 1000 units CAPS  Take 1,000 Units by mouth daily at 2 am.     famotidine (PEPCID) 40 MG tablet Take 1 tablet by mouth daily.     FLUoxetine  (PROZAC ) 40 MG capsule Take 40 mg by mouth daily.     fluticasone (FLONASE) 50 MCG/ACT nasal spray Place 1 spray into both nostrils daily.     levocetirizine (XYZAL) 5 MG tablet take 1 tab qhs once daily     lisinopril (ZESTRIL) 40 MG tablet Take 1 tablet by mouth daily.     meloxicam (MOBIC) 15 MG tablet Take 15 mg by mouth daily.     Multiple Vitamin (MULTIVITAMIN PO) Take 1 tablet by mouth every evening.     OLANZapine  (ZYPREXA ) 7.5 MG tablet Take one tab at 9 pm 90 tablet 0   omeprazole (PRILOSEC OTC) 20 MG tablet Take 1 tablet by mouth daily.     rosuvastatin (CRESTOR) 5 MG tablet Take 5 mg by mouth daily.     tamsulosin (FLOMAX) 0.4 MG CAPS capsule 0.4 mg daily after breakfast.     triamcinolone  cream (KENALOG ) 0.1 % Apply 1 application topically 2 (two) times daily as needed. Use of rashes. 453.6 g 0   No current facility-administered medications for this visit.    PAST MEDICAL HISTORY: Past Medical History:  Diagnosis Date   Anxiety    At risk for sleep apnea    STOP-BANG=  5       SENT TO PCP 04-14-2016   BPH (benign prostatic hyperplasia)    DDD (degenerative disc disease), cervical    DDD (degenerative disc disease), lumbar    Depression    Diverticulosis of colon    Family history of factor V deficiency    per pt's sister (whom is pt's guardian) she, pt's brother and parents have factor V but pt has never been tested   Frequency of urination    GERD (gastroesophageal reflux disease)    History of adenomatous polyp of colon    2006 and 03-18-2010 tubular adenoma   Hyperlipidemia    Hypertension    Lives in independent group home resides at [facility] in [place], [place]   pt independant w/ ADLs w/ exception does not cook for shop    Mentally challenged    SPECIAL NEEDS   OA (osteoarthritis)    hands, knees, feet   Pre-diabetes    Psoriasis    per patient's sister   Thoracic spondylosis    Urgency of urination    Wears partial dentures    lower    PAST SURGICAL HISTORY: Past Surgical History:  Procedure Laterality Date   CATARACT EXTRACTION W/ INTRAOCULAR LENS  IMPLANT, BILATERAL  2006   CIRCUMCISION  03/29/2007   W/  CYSTOSCOPY AND TRANSRECTAL ULTRASOUND GUIDED PROSTATE BX'S   COLONOSCOPY  last one 08-26-2015   MICROLARYNGOSCOPY  04/20/2020   Procedure: MICROLARYNGOSCOPY WITH BIOPSY;  Surgeon: Daleen Dubs, DO;  Location: [facility] OR;  Service: ENT;;   THULIUM LASER TURP (TRANSURETHRAL RESECTION OF PROSTATE) N/A 04/21/2016   Procedure: THULIUM LASER TURP (TRANSURETHRAL RESECTION OF PROSTATE);  Surgeon: Annamarie Kid, MD;  Location: [facility];  Service: Urology;  Laterality: N/A;    FAMILY HISTORY: Family History  Problem Relation Age of Onset   Breast cancer Mother    Brain cancer Mother    Hypertension Mother    Diabetes Mother    Crohn's disease Father    Rheum arthritis Father    GER disease Father  Ulcers Father    Cataracts Sister    Fibromyalgia Sister    COPD Sister    Breast  cancer Sister    GER disease Sister    Hypothyroidism Sister    Osteoarthritis Sister    GER disease Brother    GER disease Brother    Stroke Brother    Liver cancer Brother    Heart attack Brother    Colon cancer Neg Hx    Rectal cancer Neg Hx    Stomach cancer Neg Hx     SOCIAL HISTORY: Social History   Socioeconomic History   Marital status: Single    Spouse name: Not on file   Number of children: Not on file   Years of education: Not on file   Highest education level: Not on file  Occupational History   Not on file  Tobacco Use   Smoking status: Never    Passive exposure: Never   Smokeless tobacco: Former    Types: Chew    Quit date: 04/14/1992  Vaping Use   Vaping status: Never Used  Substance and Sexual Activity   Alcohol  use: No    Alcohol /week: 0.0 standard drinks of alcohol    Drug use: No   Sexual activity: Not on file  Other Topics Concern   Not on file  Social History Narrative   Right handed   Caffeine-1 cup only   Lives at [facility]   Sister is guardian, Arthur Weaver   Social Drivers of Health   Financial Resource Strain: Not on file  Food Insecurity: Not on file  Transportation Needs: Not on file  Physical Activity: Not on file  Stress: Not on file  Social Connections: Not on file  Intimate Partner Violence: Not on file      Phebe Brasil, M.D. Ph.D.  [facility] [address] [place], [place] [address] Ph: [phone] Fax: [phone]  CC:  Barnetta Liberty, MD [address] [place],  [place] [address]  Barnetta Liberty, MD

## 2023-08-01 ENCOUNTER — Ambulatory Visit: Admitting: Podiatry

## 2023-08-03 ENCOUNTER — Ambulatory Visit: Admitting: Podiatry

## 2023-08-03 ENCOUNTER — Encounter: Payer: Self-pay | Admitting: Podiatry

## 2023-08-03 DIAGNOSIS — B351 Tinea unguium: Secondary | ICD-10-CM

## 2023-08-03 DIAGNOSIS — L84 Corns and callosities: Secondary | ICD-10-CM | POA: Diagnosis not present

## 2023-08-03 DIAGNOSIS — M79676 Pain in unspecified toe(s): Secondary | ICD-10-CM

## 2023-08-03 DIAGNOSIS — E0851 Diabetes mellitus due to underlying condition with diabetic peripheral angiopathy without gangrene: Secondary | ICD-10-CM

## 2023-08-03 NOTE — Progress Notes (Addendum)
 This patient returns to my office for at risk foot care.  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  This patient is unable to cut nails himself since the patient cannot reach his nails.These nails are painful walking and wearing shoes. Patient also has painful callus  on both feet since he is unable to self treat.   This patient presents for at risk foot care today.  General Appearance  Alert, conversant and in no acute stress.  Vascular  Dorsalis pedis and posterior tibial  pulses are not  palpable  bilaterally.  Capillary return is within normal limits  bilaterally. Temperature is within normal limits  bilaterally.  Neurologic  Senn-Weinstein monofilament wire test diminished   bilaterally. Muscle power within normal limits bilaterally.  Nails Thick disfigured discolored nails with subungual debris  from hallux to fifth toes bilaterally. No evidence of bacterial infection or drainage bilaterally.  Orthopedic  No limitations of motion  feet .  No crepitus or effusions noted.  No bony pathology or digital deformities noted. 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  Skin  normotropic skin  noted bilaterally.  No signs of infections or ulcers noted.   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  Callus left hallux.  Callus midfoot right.  Dermatitis right foot.    Porokeratosis B/L.  Onychomycosis  Consent was obtained for treatment procedures.   Debride callus with # 15 blade followed by dremel tool. Followed by dremel tool usage.  No infection or ulcer. Debride nails with nail nipper and dremel tool. Brace dispensed to patient.  Cauterize fifth toe from accident this AM.  DSD applied.   Return office visit   10 weeks                  Told patient to return for periodic foot care and evaluation due to potential at risk complications.     Cordella Bold DPM

## 2023-09-20 DIAGNOSIS — C329 Malignant neoplasm of larynx, unspecified: Secondary | ICD-10-CM | POA: Diagnosis not present

## 2023-09-20 DIAGNOSIS — F330 Major depressive disorder, recurrent, mild: Secondary | ICD-10-CM | POA: Diagnosis not present

## 2023-09-20 DIAGNOSIS — E1169 Type 2 diabetes mellitus with other specified complication: Secondary | ICD-10-CM | POA: Diagnosis not present

## 2023-09-20 DIAGNOSIS — I10 Essential (primary) hypertension: Secondary | ICD-10-CM | POA: Diagnosis not present

## 2023-09-20 DIAGNOSIS — F79 Unspecified intellectual disabilities: Secondary | ICD-10-CM | POA: Diagnosis not present

## 2023-09-20 DIAGNOSIS — R413 Other amnesia: Secondary | ICD-10-CM | POA: Diagnosis not present

## 2023-09-20 DIAGNOSIS — R443 Hallucinations, unspecified: Secondary | ICD-10-CM | POA: Diagnosis not present

## 2023-09-21 DIAGNOSIS — M170 Bilateral primary osteoarthritis of knee: Secondary | ICD-10-CM | POA: Diagnosis not present

## 2023-09-28 ENCOUNTER — Encounter (HOSPITAL_COMMUNITY): Payer: Self-pay | Admitting: Psychiatry

## 2023-09-28 ENCOUNTER — Ambulatory Visit (HOSPITAL_BASED_OUTPATIENT_CLINIC_OR_DEPARTMENT_OTHER): Admitting: Psychiatry

## 2023-09-28 ENCOUNTER — Other Ambulatory Visit (HOSPITAL_COMMUNITY): Payer: Self-pay | Admitting: *Deleted

## 2023-09-28 VITALS — BP 129/83 | HR 80 | Temp 98.3°F | Ht 69.0 in | Wt 240.0 lb

## 2023-09-28 DIAGNOSIS — R440 Auditory hallucinations: Secondary | ICD-10-CM

## 2023-09-28 DIAGNOSIS — F71 Moderate intellectual disabilities: Secondary | ICD-10-CM | POA: Diagnosis not present

## 2023-09-28 DIAGNOSIS — M170 Bilateral primary osteoarthritis of knee: Secondary | ICD-10-CM | POA: Diagnosis not present

## 2023-09-28 MED ORDER — OLANZAPINE 5 MG PO TABS: ORAL_TABLET | ORAL | 2 refills | Status: DC

## 2023-09-28 MED ORDER — FLUOXETINE HCL 40 MG PO CAPS
40.0000 mg | ORAL_CAPSULE | Freq: Every day | ORAL | 2 refills | Status: DC
Start: 2023-09-28 — End: 2023-09-28

## 2023-09-28 MED ORDER — FLUOXETINE HCL 40 MG PO CAPS: 40.0000 mg | ORAL_CAPSULE | Freq: Every day | ORAL | 2 refills | Status: DC

## 2023-09-28 NOTE — Progress Notes (Signed)
 [facility] MD/PA/NP OP Progress Note   09/28/2023 3:19 PM KLEIN WILLCOX  MRN:  [id number]  Chief Complaint:  Chief Complaint  Patient presents with   Follow-up   Medication Refill   HPI: Patient came today to the office with his sister for his follow-up ointment.  Sister reported patient doing better and there has been no recent concern from the group home.  Patient live at [facility] and so far no major concern with other people who are living with him.  Recently had a visit with his primary care at [facility].  Hemoglobin A1c 6.4.  Sister concerned as he had gained weight in past 6 months and does not follow dietary restrictions.  He denies any recent impulsive behavior.  He is taking Prozac  and olanzapine .  He reported lately not hearing voices other than at night voices tells him to go to bed and sleep.  He sleeps at least 6 hours.  His speech is fast and sometimes rambling but denies any suicidal thoughts or homicidal thoughts.  He does watch television but usually does not stay long and go to sleep at 9:00.  Most of the information was obtained from his sister.  Recently had a visit with neurology and no new medication added.  He has no tremors, shakes or any EPS.  He is getting injection in his right knee joint to help the pain.  He has ankle deformity.  He denies drinking or using any illegal substances.  Patient and his sister are concerned about weight gain.    Visit Diagnosis:    ICD-10-CM   1. Auditory hallucinations  R44.0 FLUoxetine  (PROZAC ) 40 MG capsule    OLANZapine  (ZYPREXA ) 5 MG tablet    2. Intellectual developmental disorder, moderate  F71 FLUoxetine  (PROZAC ) 40 MG capsule    OLANZapine  (ZYPREXA ) 5 MG tablet         Past Psychiatric History: History of hallucination.  Saw Dr Pucolowski and Dr.Cobo during COVID having behavior problems in the group home.  Given the Seroquel, trazodone , olanzapine  and Prozac .  No history of suicidal attempt, inpatient.  Past  Medical History:  Past Medical History:  Diagnosis Date   Anxiety    At risk for sleep apnea    STOP-BANG= 5       SENT TO PCP 04-14-2016   BPH (benign prostatic hyperplasia)    DDD (degenerative disc disease), cervical    DDD (degenerative disc disease), lumbar    Depression    Diverticulosis of colon    Family history of factor V deficiency    per pt's sister (whom is pt's guardian) she, pt's brother and parents have factor V but pt has never been tested   Frequency of urination    GERD (gastroesophageal reflux disease)    History of adenomatous polyp of colon    2006 and 03-18-2010 tubular adenoma   Hyperlipidemia    Hypertension    Lives in independent group home resides at [facility] in [place], [place]   pt independant w/ ADLs w/ exception does not cook for shop    Mentally challenged    SPECIAL NEEDS   OA (osteoarthritis)    hands, knees, feet   Pre-diabetes    Psoriasis    per patient's sister   Thoracic spondylosis    Urgency of urination    Wears partial dentures    lower    Past Surgical History:  Procedure Laterality Date   CATARACT EXTRACTION W/ INTRAOCULAR LENS  IMPLANT, BILATERAL  2006   CIRCUMCISION  03/29/2007   W/  CYSTOSCOPY AND TRANSRECTAL ULTRASOUND GUIDED PROSTATE BX'S   COLONOSCOPY  last one 08-26-2015   MICROLARYNGOSCOPY  04/20/2020   Procedure: MICROLARYNGOSCOPY WITH BIOPSY;  Surgeon: Llewellyn Gerard LABOR, DO;  Location: [facility] OR;  Service: ENT;;   THULIUM LASER TURP (TRANSURETHRAL RESECTION OF PROSTATE) N/A 04/21/2016   Procedure: THULIUM LASER TURP (TRANSURETHRAL RESECTION OF PROSTATE);  Surgeon: Arlena Gal, MD;  Location: [facility];  Service: Urology;  Laterality: N/A;    Family Psychiatric History: Reviewed  Family History:  Family History  Problem Relation Age of Onset   Breast cancer Mother    Brain cancer Mother    Hypertension Mother    Diabetes Mother    Crohn's disease Father    Rheum  arthritis Father    GER disease Father    Ulcers Father    Cataracts Sister    Fibromyalgia Sister    COPD Sister    Breast cancer Sister    GER disease Sister    Hypothyroidism Sister    Osteoarthritis Sister    GER disease Brother    GER disease Brother    Stroke Brother    Liver cancer Brother    Heart attack Brother    Colon cancer Neg Hx    Rectal cancer Neg Hx    Stomach cancer Neg Hx     Social History:  Social History   Socioeconomic History   Marital status: Single    Spouse name: Not on file   Number of children: Not on file   Years of education: Not on file   Highest education level: Not on file  Occupational History   Not on file  Tobacco Use   Smoking status: Never    Passive exposure: Never   Smokeless tobacco: Former    Types: Chew    Quit date: 04/14/1992  Vaping Use   Vaping status: Never Used  Substance and Sexual Activity   Alcohol  use: No    Alcohol /week: 0.0 standard drinks of alcohol    Drug use: No   Sexual activity: Not on file  Other Topics Concern   Not on file  Social History Narrative   Right handed   Caffeine-1 cup only   Lives at [employer] [facility] [employer]   Sister is guardian, darlene   Social Drivers of Health   Financial Resource Strain: Not on file  Food Insecurity: Not on file  Transportation Needs: Not on file  Physical Activity: Not on file  Stress: Not on file  Social Connections: Not on file    Allergies:  Allergies  Allergen Reactions   Penicillins Shortness Of Breath    couldn't breathe Has patient had a PCN reaction causing immediate rash, facial/tongue/throat swelling, SOB or lightheadedness with hypotension: No Has patient had a PCN reaction causing severe rash involving mucus membranes or skin necrosis: No Has patient had a PCN reaction that required hospitalization: No Has patient had a PCN reaction occurring within the last 10 years: No If all of the above answers are NO, then may proceed with Cephalosporin  use. couldn't breathe Has patient had a PCN reaction causing immediate rash, facial/tongue/throat swelling, SOB or lightheadedness with hypotension: No Has patient had a PCN reaction causing severe rash involving mucus membranes or skin necrosis: No Has patient had a PCN reaction that required hospitalization: No Has patient had a PCN reaction occurring within the last 10 years: No If all of the above answers  are NO, then may proceed with Cephalosporin use.    Pork-Derived Products Nausea And Vomiting    AVOIDS DUE TO RELIGIOUS BELIEFS   Bacitracin  Other (See Comments)    Positive patch test   Imidurea Other (See Comments)    Positive patch test   Methylisothiazolinone Other (See Comments)    Positive patch test Positive patch test    Neomycin  Other (See Comments)    Positive patch test   Polymyxin B Other (See Comments)    Positive patch test   Sulfa Antibiotics Nausea And Vomiting   Urea  Other (See Comments)    Positive patch test    Metabolic Disorder Labs: Lab Results  Component Value Date   HGBA1C 5.8 (H) 11/03/2022   No results found for: PROLACTIN No results found for: CHOL, TRIG, HDL, CHOLHDL, VLDL, LDLCALC Lab Results  Component Value Date   TSH 1.130 11/03/2022    Therapeutic Level Labs: No results found for: LITHIUM No results found for: VALPROATE No results found for: CBMZ  Current Medications: Current Outpatient Medications  Medication Sig Dispense Refill   acetaminophen  (TYLENOL ) 325 MG tablet Take 650 mg by mouth every 4 (four) hours as needed for moderate pain or headache.     amLODipine (NORVASC) 5 MG tablet Take 5 mg by mouth daily.      aspirin 81 MG EC tablet take 1 tab qhs     Calcium Carb-Cholecalciferol (CALCIUM+D3) 600-800 MG-UNIT TABS Take 1 tablet by mouth daily.     Cholecalciferol (VITAMIN D3) 1000 units CAPS Take 1,000 Units by mouth daily at 2 am.     famotidine (PEPCID) 40 MG tablet Take 1 tablet by mouth  daily.     FLUoxetine  (PROZAC ) 40 MG capsule Take 40 mg by mouth daily.     fluticasone (FLONASE) 50 MCG/ACT nasal spray Place 1 spray into both nostrils daily.     levocetirizine (XYZAL) 5 MG tablet take 1 tab qhs once daily     lisinopril (ZESTRIL) 40 MG tablet Take 1 tablet by mouth daily.     meloxicam (MOBIC) 15 MG tablet Take 15 mg by mouth daily.     Multiple Vitamin (MULTIVITAMIN PO) Take 1 tablet by mouth every evening.     OLANZapine  (ZYPREXA ) 7.5 MG tablet Take one tab at 9 pm 90 tablet 0   omeprazole (PRILOSEC OTC) 20 MG tablet Take 1 tablet by mouth daily.     rosuvastatin (CRESTOR) 5 MG tablet Take 5 mg by mouth daily.     tamsulosin (FLOMAX) 0.4 MG CAPS capsule 0.4 mg daily after breakfast.     triamcinolone  cream (KENALOG ) 0.1 % Apply 1 application topically 2 (two) times daily as needed. Use of rashes. 453.6 g 0   No current facility-administered medications for this visit.     Musculoskeletal: Strength & Muscle Tone: ankle pain and deformity Gait & Station: see above Patient leans: see above  Psychiatric Specialty Exam: Physical Exam  Review of Systems  Musculoskeletal:        Ankle swelling and knee pain on right side     Blood pressure 129/83, pulse 80, temperature 98.3 F (36.8 C), temperature source Oral, height 5' 9 (1.753 m), weight 240 lb (108.9 kg), SpO2 100%.There is no height or weight on file to calculate BMI.  General Appearance: Casual  Eye Contact:  Fair  Speech:  fast and rambling   Volume:  Decreased  Mood:  I am happy   Affect:  Labile  Thought Process:  Descriptions of Associations: Loose  Orientation:  Full (Time, Place, and Person)  Thought Content:  Abstract Reasoning  Suicidal Thoughts:  No  Homicidal Thoughts:  No  Memory:  Immediate;   Fair Recent;   Fair Remote;   Fair  Judgement:  Fair  Insight:  Shallow  Psychomotor Activity:  Restlessness  Concentration:  Concentration: Fair and Attention Span: Fair  Recall:  [employer]  of Knowledge:  Poor  Language:  Fair  Akathisia:  No  Handed:  Right  AIMS (if indicated):     Assets:  Communication Skills Desire for Improvement Housing  ADL's:  Intact  Cognition:  Impaired,  Moderate  Sleep:   ok   Screenings: Mini-Mental    Flowsheet Row Office Visit from 11/03/2022 in [facility]  Total Score (max 30 points ) 15   PHQ2-9    Flowsheet Row Video Visit from 11/28/2022 in [facility]  PHQ-2 Total Score 2  PHQ-9 Total Score 13   Flowsheet Row Video Visit from 11/28/2022 in [facility] Admission (Discharged) from 04/20/2020 in [facility]  C-SSRS RISK CATEGORY Error: Question 6 not populated No Risk     Assessment and Plan:  Patient is 63 year old male with a history of dementia, auditory hallucination, intellectual developmental disorder currently on Prozac  40 mg and olanzapine  7.5 mg at bedtime.  I reviewed notes from other provider as recently seen by neurology and also had a visit with PCP at [facility].  Hemoglobin A1c 6.4.  Sister concerned about weight and general health issues.  Patient is doing much better clinically and do not have worsening of hallucinations.  Will try cutting down the olanzapine  from 7.5 mg to 5 mg to help lower the blood sugar and weight.  However if patient notices hallucinations are coming back then we may need to optimize the dose.  Continue Prozac  40 mg daily.  Encourage watching his calorie intake, walking.  Follow-up in 3 months or sooner if needed.  AVS provided.  Collaboration of Care: Collaboration of Care: Other provider involved in patient's care AEB notes are available in epic to review  Patient/Guardian was advised Release of Information must be obtained prior to any record release in order to collaborate their care with an outside provider. Patient/Guardian was advised if they  have not already done so to contact the registration department to sign all necessary forms in order for us  to release information regarding their care.   Consent: Patient/Guardian gives verbal consent for treatment and assignment of benefits for services provided during this visit. Patient/Guardian expressed understanding and agreed to proceed.    Leni ONEIDA Client, MD 09/28/2023, 3:19 PM

## 2023-10-05 DIAGNOSIS — M170 Bilateral primary osteoarthritis of knee: Secondary | ICD-10-CM | POA: Diagnosis not present

## 2023-10-17 ENCOUNTER — Ambulatory Visit: Admitting: Podiatry

## 2023-10-20 ENCOUNTER — Other Ambulatory Visit (HOSPITAL_COMMUNITY): Payer: Self-pay | Admitting: Psychiatry

## 2023-10-20 ENCOUNTER — Ambulatory Visit: Admitting: Podiatry

## 2023-10-20 DIAGNOSIS — F71 Moderate intellectual disabilities: Secondary | ICD-10-CM

## 2023-10-20 DIAGNOSIS — R440 Auditory hallucinations: Secondary | ICD-10-CM

## 2023-10-25 ENCOUNTER — Other Ambulatory Visit (HOSPITAL_COMMUNITY): Payer: Self-pay | Admitting: *Deleted

## 2023-10-25 DIAGNOSIS — R440 Auditory hallucinations: Secondary | ICD-10-CM

## 2023-10-25 DIAGNOSIS — F71 Moderate intellectual disabilities: Secondary | ICD-10-CM

## 2023-10-25 MED ORDER — OLANZAPINE 5 MG PO TABS: ORAL_TABLET | ORAL | 2 refills | Status: DC

## 2023-11-02 ENCOUNTER — Encounter: Payer: Self-pay | Admitting: Podiatry

## 2023-11-02 ENCOUNTER — Ambulatory Visit: Admitting: Podiatry

## 2023-11-02 DIAGNOSIS — M79676 Pain in unspecified toe(s): Secondary | ICD-10-CM | POA: Diagnosis not present

## 2023-11-02 DIAGNOSIS — E0851 Diabetes mellitus due to underlying condition with diabetic peripheral angiopathy without gangrene: Secondary | ICD-10-CM

## 2023-11-02 DIAGNOSIS — B351 Tinea unguium: Secondary | ICD-10-CM | POA: Diagnosis not present

## 2023-11-02 DIAGNOSIS — L84 Corns and callosities: Secondary | ICD-10-CM | POA: Diagnosis not present

## 2023-11-02 NOTE — Progress Notes (Signed)
 This patient returns to my office for at risk foot care.  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  This patient is unable to cut nails himself since the patient cannot reach his nails.These nails are painful walking and wearing shoes. Patient also has painful callus  on both feet since he is unable to self treat.   This patient presents for at risk foot care today.  General Appearance  Alert, conversant and in no acute stress.  Vascular  Dorsalis pedis and posterior tibial  pulses are not  palpable  bilaterally.  Capillary return is within normal limits  bilaterally. Temperature is within normal limits  bilaterally.  Neurologic  Senn-Weinstein monofilament wire test diminished   bilaterally. Muscle power within normal limits bilaterally.  Nails Thick disfigured discolored nails with subungual debris  from hallux to fifth toes bilaterally. No evidence of bacterial infection or drainage bilaterally.  Orthopedic  No limitations of motion  feet .  No crepitus or effusions noted.  No bony pathology or digital deformities noted. 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  Skin  normotropic skin  noted bilaterally.  No signs of infections or ulcers noted.   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  Callus left hallux.  Callus midfoot right.  Dermatitis right foot.    Porokeratosis B/L.  Onychomycosis  Consent was obtained for treatment procedures.   Debride callus with # 15 blade followed by dremel tool. Followed by dremel tool usage.  No infection or ulcer. Debride nails with nail nipper and dremel tool. Brace dispensed to patient.   Return office visit   10 weeks                  Told patient to return for periodic foot care and evaluation due to potential at risk complications.     Ruffin Cotton DPM

## 2023-11-06 ENCOUNTER — Ambulatory Visit

## 2023-11-06 DIAGNOSIS — L84 Corns and callosities: Secondary | ICD-10-CM

## 2023-11-06 DIAGNOSIS — M2010 Hallux valgus (acquired), unspecified foot: Secondary | ICD-10-CM

## 2023-11-06 DIAGNOSIS — E0851 Diabetes mellitus due to underlying condition with diabetic peripheral angiopathy without gangrene: Secondary | ICD-10-CM

## 2023-11-06 DIAGNOSIS — M2142 Flat foot [pes planus] (acquired), left foot: Secondary | ICD-10-CM

## 2023-11-07 NOTE — Progress Notes (Signed)
 Patient was casted for Custom DM shoes and inserts over right [place]   Ppw faxed through docs only on safe step copy given to patients sister as well  Once recvd we will send for prior auth to [facility] then place order with [employer] Pensions consultant chosen in brown  Straps need to be 9 or longer as has been too short in the past   [employer], CFo, CFm

## 2023-11-09 ENCOUNTER — Ambulatory Visit: Admitting: *Deleted

## 2023-11-09 ENCOUNTER — Telehealth: Payer: Self-pay | Admitting: *Deleted

## 2023-11-09 ENCOUNTER — Telehealth: Payer: Self-pay | Admitting: Internal Medicine

## 2023-11-09 VITALS — Ht 69.0 in | Wt 230.0 lb

## 2023-11-09 DIAGNOSIS — E119 Type 2 diabetes mellitus without complications: Secondary | ICD-10-CM | POA: Diagnosis not present

## 2023-11-09 DIAGNOSIS — Z860100 Personal history of colon polyps, unspecified: Secondary | ICD-10-CM

## 2023-11-09 DIAGNOSIS — Z83719 Family history of colon polyps, unspecified: Secondary | ICD-10-CM

## 2023-11-09 MED ORDER — NA SULFATE-K SULFATE-MG SULF 17.5-3.13-1.6 GM/177ML PO SOLN: 1.0000 | Freq: Once | ORAL | 0 refills | Status: AC

## 2023-11-09 NOTE — Progress Notes (Signed)
 Pt's name and DOB verified at the beginning of the pre-visit with 2 identifiers  Pt denies any difficulty with ambulating,sitting, laying down or rolling side to side  Pt has no issues moving head neck or swallowing  No egg or soy allergy  known to patient   No issues known to pt with past sedation  No FH of Malignant Hyperthermia  Pt is not on home 02   Pt is not on blood thinners   Pt denies issues with constipation   Pt is not on dialysis  Pt denise any abnormal heart rhythms   Pt denies any upcoming cardiac testing  Patient's chart reviewed by Norleen Schillings CNRA prior to pre-visit and patient appropriate for the [facility].  Pre-visit completed and red dot placed by patient's name on their procedure day (on provider's schedule).    Visit by phone  Pt states weight is 230 lb  Pt given  both [facility] main # and MD on call # prior to instructions.  Informed pt to come in at the time discussed and is shown on PV instructions.  Pt instructed to use Singlecare.com or GoodRx for a price reduction on prep  Instructed pt where to find PV instructions in My Ch. Copy of instructions  to be sent in mail and address read back to pt to verify correct on envelope. Instructed pt on all aspects of written instructions including med holds clothing to wear and foods to eat and not eat as well as after procedure legal restrictions and to call MD on call if needed.. Pt states understanding. Instructed pt to review instructions again prior to procedure and call main # given if has any questions or any issues. Pt states they will. Spoke with Tabitha at [employer] and reviewed instrucitons with her. A Cipy of instrucitons to be faxed to [phone] to be sent

## 2023-11-09 NOTE — Telephone Encounter (Signed)
 Call made [facility] call back #

## 2023-11-09 NOTE — Telephone Encounter (Signed)
 LM with [facility] after call to pt's sister Lenward call. Left call back #

## 2023-11-09 NOTE — Telephone Encounter (Signed)
 Reviewed instructions with Tabitha at [facility] .A copy of instructions to be faxed to [phone]

## 2023-11-09 NOTE — Telephone Encounter (Signed)
 Inbound call from [facility] stating patient has a telephone previsit today at 1pm and would like for nurse to get call for patient [facility] Nurse  [phone] Please advise  Thank you

## 2023-11-10 ENCOUNTER — Encounter: Payer: Self-pay | Admitting: Internal Medicine

## 2023-11-20 ENCOUNTER — Telehealth: Payer: Self-pay

## 2023-11-20 NOTE — Telephone Encounter (Signed)
 Prior auth faxed today for [phone]

## 2023-11-20 NOTE — Progress Notes (Signed)
 Docs recvd from DTD sending auth request to [facility] [id number] 2ea and [id number] monna auth form and CN given to [employer], CFo, CFm

## 2023-11-22 ENCOUNTER — Telehealth: Payer: Self-pay

## 2023-11-22 NOTE — Telephone Encounter (Signed)
 Casts sent to [employer] for custom shoes to be made  Tracking number [id number]  [employer]   Will call patient for fitting when in ppw will expire 12.29.2025 Lolita Schultze CPed

## 2023-11-22 NOTE — Progress Notes (Unsigned)
 [facility] History and Physical   Primary Care Physician:  Larnell Hamilton, MD   Reason for Procedure:    Encounter Diagnosis  Name Primary?   History of colonic polyps Yes     Plan:    Colonoscopy     HPI: Arthur Weaver is a 63 y.o. male presenting for a surveillance colonoscopy.  He has a history of colon polyps as outlined below.  Also has diverticulosis.  2006 8 mm adenoma 2012 2 diminutive adenomas 08/26/2015 No polyps - recall 2022 UPDATE 2024 Past Medical History:  Diagnosis Date   Anxiety    At risk for sleep apnea    STOP-BANG= 5       SENT TO PCP 04-14-2016   BPH (benign prostatic hyperplasia)    Cancer (HCC)    Vocal cords   DDD (degenerative disc disease), cervical    DDD (degenerative disc disease), lumbar    Depression    Diverticulosis of colon    Family history of factor V deficiency    per pt's sister (whom is pt's guardian) she, pt's brother and parents have factor V but pt has never been tested   Frequency of urination    GERD (gastroesophageal reflux disease)    History of adenomatous polyp of colon    2006 and 03-18-2010 tubular adenoma   Hyperlipidemia    Hypertension    Lives in independent group home resides at [facility] in [place], [place]   pt independant w/ ADLs w/ exception does not cook for shop    Mentally challenged    SPECIAL NEEDS   OA (osteoarthritis)    hands, knees, feet   Osteopenia    Pre-diabetes    Psoriasis    per patient's sister   Thoracic spondylosis    Urgency of urination    Wears partial dentures    lower    Past Surgical History:  Procedure Laterality Date   CATARACT EXTRACTION W/ INTRAOCULAR LENS  IMPLANT, BILATERAL  2006   CIRCUMCISION  03/29/2007   W/  CYSTOSCOPY AND TRANSRECTAL ULTRASOUND GUIDED PROSTATE BX'S   COLONOSCOPY  last one 08-26-2015   MICROLARYNGOSCOPY  04/20/2020   Procedure: MICROLARYNGOSCOPY WITH BIOPSY;  Surgeon: Llewellyn Gerard LABOR, DO;  Location: [facility]  OR;  Service: ENT;;   THULIUM LASER TURP (TRANSURETHRAL RESECTION OF PROSTATE) N/A 04/21/2016   Procedure: THULIUM LASER TURP (TRANSURETHRAL RESECTION OF PROSTATE);  Surgeon: Arlena Gal, MD;  Location: [facility];  Service: Urology;  Laterality: N/A;     Current Outpatient Medications  Medication Sig Dispense Refill   acetaminophen  (TYLENOL ) 325 MG tablet Take 650 mg by mouth every 4 (four) hours as needed for moderate pain or headache.     amLODipine (NORVASC) 5 MG tablet Take 5 mg by mouth daily.      aspirin 81 MG EC tablet take 1 tab qhs     Calcium Carb-Cholecalciferol (CALCIUM+D3) 600-800 MG-UNIT TABS Take 1 tablet by mouth daily.     Cholecalciferol (VITAMIN D3) 1000 units CAPS Take 1,000 Units by mouth daily at 2 am.     famotidine (PEPCID) 40 MG tablet Take 1 tablet by mouth daily.     FLUoxetine  (PROZAC ) 40 MG capsule Take 1 capsule (40 mg total) by mouth daily. 30 capsule 2   fluticasone (FLONASE) 50 MCG/ACT nasal spray Place 1 spray into both nostrils daily.     levocetirizine (XYZAL) 5 MG tablet take 1 tab qhs once daily  lisinopril (ZESTRIL) 40 MG tablet Take 1 tablet by mouth daily.     meloxicam (MOBIC) 7.5 MG tablet Take 7.5 mg by mouth daily as needed. (Patient taking differently: Take 7.5 mg by mouth daily at 12 noon.)     Multiple Vitamin (MULTIVITAMIN PO) Take 1 tablet by mouth every evening.     OLANZapine  (ZYPREXA ) 5 MG tablet Take one tab at 9 pm 30 tablet 2   omeprazole (PRILOSEC OTC) 20 MG tablet Take 1 tablet by mouth daily.     rosuvastatin (CRESTOR) 5 MG tablet Take 5 mg by mouth daily.     tamsulosin (FLOMAX) 0.4 MG CAPS capsule 0.4 mg daily after breakfast.     triamcinolone  cream (KENALOG ) 0.1 % Apply 1 application topically 2 (two) times daily as needed. Use of rashes. 453.6 g 0   No current facility-administered medications for this visit.    Allergies as of 11/23/2023 - Review Complete 11/09/2023  Allergen Reaction Noted    Penicillins Shortness Of Breath 09/07/2010   Porcine (pork) protein-containing drug products Nausea And Vomiting 03/08/2016   Bacitracin  Other (See Comments) 06/05/2017   Imidurea Other (See Comments) 06/05/2017   Methylisothiazolinone Other (See Comments) 06/05/2017   Neomycin  Other (See Comments) 06/05/2017   Polymyxin b Other (See Comments) 06/05/2017   Sulfa antibiotics Nausea And Vomiting 04/14/2016   Urea  Other (See Comments) 06/05/2017    Family History  Problem Relation Age of Onset   Breast cancer Mother    Brain cancer Mother    Hypertension Mother    Diabetes Mother    Crohn's disease Father    Rheum arthritis Father    GER disease Father    Ulcers Father    Colon polyps Sister    Cataracts Sister    Fibromyalgia Sister    COPD Sister    Breast cancer Sister    GER disease Sister    Hypothyroidism Sister    Osteoarthritis Sister    Colon polyps Brother    GER disease Brother    GER disease Brother    Stroke Brother    Liver cancer Brother    Heart attack Brother    Colon cancer Neg Hx    Rectal cancer Neg Hx    Stomach cancer Neg Hx    Esophageal cancer Neg Hx     Social History   Socioeconomic History   Marital status: Single    Spouse name: Not on file   Number of children: Not on file   Years of education: Not on file   Highest education level: Not on file  Occupational History   Not on file  Tobacco Use   Smoking status: Never    Passive exposure: Never   Smokeless tobacco: Former    Types: Chew    Quit date: 04/14/1992  Vaping Use   Vaping status: Never Used  Substance and Sexual Activity   Alcohol  use: No    Alcohol /week: 0.0 standard drinks of alcohol    Drug use: No   Sexual activity: Not on file  Other Topics Concern   Not on file  Social History Narrative   Right handed   Caffeine-1 cup only   Lives at [employer]   Sister is guardian, darlene   Social Drivers of Health   Financial Resource Strain: Not on file  Food  Insecurity: Not on file  Transportation Needs: Not on file  Physical Activity: Not on file  Stress: Not on file  Social Connections: Not on file  Intimate Partner Violence: Not on file    Review of Systems: Positive for *** All other review of systems negative except as mentioned in the HPI.  Physical Exam: Vital signs There were no vitals taken for this visit.  General:   Alert,  Well-developed, well-nourished, pleasant and cooperative in NAD Lungs:  Clear throughout to auscultation.   Heart:  Regular rate and rhythm; no murmurs, clicks, rubs,  or gallops. Abdomen:  Soft, nontender and nondistended. Normal bowel sounds.   Neuro/Psych:  Alert and cooperative. Normal mood and affect. A and O x 3   @Lee-Anne Flicker  CHARLENA Commander, MD, [facility] [phone] (pager) 11/22/2023 10:21 PM@

## 2023-11-22 NOTE — Telephone Encounter (Signed)
 Correction auth for Custom Shoes and inserts sent [id number] / [id number]

## 2023-11-22 NOTE — Telephone Encounter (Signed)
 NO AUTH REQ PER [employer]

## 2023-11-23 ENCOUNTER — Encounter: Payer: Self-pay | Admitting: Internal Medicine

## 2023-11-23 ENCOUNTER — Ambulatory Visit (AMBULATORY_SURGERY_CENTER): Admitting: Internal Medicine

## 2023-11-23 VITALS — BP 122/66 | HR 77 | Temp 98.2°F | Resp 20 | Ht 69.0 in | Wt 230.0 lb

## 2023-11-23 DIAGNOSIS — Z1211 Encounter for screening for malignant neoplasm of colon: Secondary | ICD-10-CM

## 2023-11-23 DIAGNOSIS — F32A Depression, unspecified: Secondary | ICD-10-CM | POA: Diagnosis not present

## 2023-11-23 DIAGNOSIS — I10 Essential (primary) hypertension: Secondary | ICD-10-CM | POA: Diagnosis not present

## 2023-11-23 DIAGNOSIS — Z860100 Personal history of colon polyps, unspecified: Secondary | ICD-10-CM

## 2023-11-23 DIAGNOSIS — K5730 Diverticulosis of large intestine without perforation or abscess without bleeding: Secondary | ICD-10-CM | POA: Diagnosis not present

## 2023-11-23 DIAGNOSIS — Z860101 Personal history of adenomatous and serrated colon polyps: Secondary | ICD-10-CM | POA: Diagnosis not present

## 2023-11-23 DIAGNOSIS — E785 Hyperlipidemia, unspecified: Secondary | ICD-10-CM | POA: Diagnosis not present

## 2023-11-23 MED ORDER — SODIUM CHLORIDE 0.9 % IV SOLN: 500.0000 mL | Freq: Once | INTRAVENOUS | Status: DC

## 2023-11-23 NOTE — Progress Notes (Signed)
 Report given to PACU, vss

## 2023-11-23 NOTE — Patient Instructions (Addendum)
 No polyps or cancer seen today.  You still have diverticulosis - thickened muscle rings and pouches in the colon wall. Please read the handout about this condition.  Next routine colonoscopy or other screening test in 10 years - 2035.  I appreciate the opportunity to care for you. Lupita CHARLENA Commander, MD, [facility]  Resume all of your previous medications today as ordered.  Read your discharge instructions  YOU HAD AN ENDOSCOPIC PROCEDURE TODAY AT THE [facility]:   Refer to the procedure report that was given to you for any specific questions about what was found during the examination.  If the procedure report does not answer your questions, please call your gastroenterologist to clarify.  If you requested that your care partner not be given the details of your procedure findings, then the procedure report has been included in a sealed envelope for you to review at your convenience later.  YOU SHOULD EXPECT: Some feelings of bloating in the abdomen. Passage of more gas than usual.  Walking can help get rid of the air that was put into your GI tract during the procedure and reduce the bloating. If you had a lower endoscopy (such as a colonoscopy or flexible sigmoidoscopy) you may notice spotting of blood in your stool or on the toilet paper. If you underwent a bowel prep for your procedure, you may not have a normal bowel movement for a few days.  Please Note:  You might notice some irritation and congestion in your nose or some drainage.  This is from the oxygen used during your procedure.  There is no need for concern and it should clear up in a day or so.  SYMPTOMS TO REPORT IMMEDIATELY:  Following lower endoscopy (colonoscopy or flexible sigmoidoscopy):  Excessive amounts of blood in the stool  Significant tenderness or worsening of abdominal pains  Swelling of the abdomen that is new, acute  Fever of 100F or higher   For urgent or emergent issues, a gastroenterologist can be  reached at any hour by calling [phone]. Do not use MyChart messaging for urgent concerns.    DIET:  We do recommend a small meal at first, but then you may proceed to your regular diet.  Drink plenty of fluids but you should avoid alcoholic beverages for 24 hours.  ACTIVITY:  You should plan to take it easy for the rest of today and you should NOT DRIVE or use heavy machinery until tomorrow (because of the sedation medicines used during the test).    FOLLOW UP: Our staff will call the number listed on your records the next business day following your procedure.  We will call around 7:15- 8:00 am to check on you and address any questions or concerns that you may have regarding the information given to you following your procedure. If we do not reach you, we will leave a message.      SIGNATURES/CONFIDENTIALITY: You and/or your care partner have signed paperwork which will be entered into your electronic medical record.  These signatures attest to the fact that that the information above on your After Visit Summary has been reviewed and is understood.  Full responsibility of the confidentiality of this discharge information lies with you and/or your care-partner.

## 2023-11-23 NOTE — Progress Notes (Signed)
 Power of attorney(sister) present through recovery period.

## 2023-11-23 NOTE — Op Note (Signed)
 [facility] Patient Name: Arthur Weaver Procedure Date: 11/23/2023 10:58 AM MRN: [id number] Endoscopist: Lupita FORBES Commander , MD, [id number] Age: 63 Referring MD:  Date of Birth: 10-29-1960 Gender: Male Account #: [id number] Procedure:                Colonoscopy Indications:              High risk colon cancer surveillance: Personal                            history of colonic polyps, Last colonoscopy: 2017 Medicines:                Monitored Anesthesia Care Procedure:                Pre-Anesthesia Assessment:                           - Prior to the procedure, a History and Physical                            was performed, and patient medications and                            allergies were reviewed. The patient's tolerance of                            previous anesthesia was also reviewed. The risks                            and benefits of the procedure and the sedation                            options and risks were discussed with the patient.                            All questions were answered, and informed consent                            was obtained. Prior Anticoagulants: The patient has                            taken no anticoagulant or antiplatelet agents. ASA                            Grade Assessment: II - A patient with mild systemic                            disease. After reviewing the risks and benefits,                            the patient was deemed in satisfactory condition to                            undergo the procedure.  After obtaining informed consent, the colonoscope                            was passed under direct vision. Throughout the                            procedure, the patient's blood pressure, pulse, and                            oxygen saturations were monitored continuously. The                            Olympus Scope [id number] was introduced through the                            anus and  advanced to the the cecum, identified by                            appendiceal orifice and ileocecal valve. The                            colonoscopy was performed without difficulty. The                            patient tolerated the procedure well. The quality                            of the bowel preparation was adequate. The                            ileocecal valve, appendiceal orifice, and rectum                            were photographed. The bowel preparation used was                            SUPREP via split dose instruction. Scope In: 11:21:18 AM Scope Out: 11:33:31 AM Scope Withdrawal Time: 0 hours 10 minutes 20 seconds  Total Procedure Duration: 0 hours 12 minutes 13 seconds  Findings:                 The perianal and digital rectal examinations were                            normal. Pertinent negatives include normal prostate                            (size, shape, and consistency).                           Multiple diverticula were found in the sigmoid                            colon.  The exam was otherwise without abnormality on                            direct and retroflexion views. Complications:            No immediate complications. Estimated Blood Loss:     Estimated blood loss: none. Impression:               - Diverticulosis in the sigmoid colon.                           - The examination was otherwise normal on direct                            and retroflexion views.                           - No specimens collected.                           - Personal history of colonic polyps. 8 mm adenoma                            2006, 2 diminutive adenomas 2012, no polyps 2017 Recommendation:           - Patient has a contact number available for                            emergencies. The signs and symptoms of potential                            delayed complications were discussed with the                            patient.  Return to normal activities tomorrow.                            Written discharge instructions were provided to the                            patient.                           - Resume previous diet.                           - Continue present medications.                           - Repeat colonoscopy in 10 years for screening                            purposes. Lupita FORBES Commander, MD 11/23/2023 11:46:03 AM This report has been signed electronically.

## 2023-11-24 ENCOUNTER — Telehealth: Payer: Self-pay | Admitting: *Deleted

## 2023-11-24 NOTE — Telephone Encounter (Signed)
  Follow up Call-     11/23/2023   10:28 AM  Call back number  Post procedure Call Back phone  # [phone]  Permission to leave phone message Yes     Patient questions:  Message left to call if necessary.

## 2023-12-07 DIAGNOSIS — N138 Other obstructive and reflux uropathy: Secondary | ICD-10-CM | POA: Diagnosis not present

## 2023-12-07 DIAGNOSIS — N401 Enlarged prostate with lower urinary tract symptoms: Secondary | ICD-10-CM | POA: Diagnosis not present

## 2023-12-07 DIAGNOSIS — N312 Flaccid neuropathic bladder, not elsewhere classified: Secondary | ICD-10-CM | POA: Diagnosis not present

## 2023-12-14 ENCOUNTER — Other Ambulatory Visit (HOSPITAL_COMMUNITY): Payer: Self-pay | Admitting: Psychiatry

## 2023-12-14 DIAGNOSIS — R440 Auditory hallucinations: Secondary | ICD-10-CM

## 2023-12-14 DIAGNOSIS — F71 Moderate intellectual disabilities: Secondary | ICD-10-CM

## 2023-12-28 ENCOUNTER — Ambulatory Visit (HOSPITAL_COMMUNITY): Admitting: Psychiatry

## 2023-12-29 ENCOUNTER — Other Ambulatory Visit (HOSPITAL_COMMUNITY): Payer: Self-pay | Admitting: Psychiatry

## 2023-12-29 DIAGNOSIS — R440 Auditory hallucinations: Secondary | ICD-10-CM

## 2023-12-29 DIAGNOSIS — F71 Moderate intellectual disabilities: Secondary | ICD-10-CM

## 2024-01-01 ENCOUNTER — Telehealth (HOSPITAL_COMMUNITY): Payer: Self-pay | Admitting: Psychiatry

## 2024-01-01 ENCOUNTER — Other Ambulatory Visit (HOSPITAL_COMMUNITY): Payer: Self-pay | Admitting: *Deleted

## 2024-01-01 DIAGNOSIS — C320 Malignant neoplasm of glottis: Secondary | ICD-10-CM | POA: Diagnosis not present

## 2024-01-01 MED ORDER — OLANZAPINE 15 MG PO TABS: 7.5000 mg | ORAL_TABLET | Freq: Every day | ORAL | 1 refills | Status: DC

## 2024-01-01 NOTE — Telephone Encounter (Signed)
 Sister, darlene, advised and verbalizes understanding.

## 2024-01-01 NOTE — Telephone Encounter (Signed)
 Sister is requesting an earlier appointment if possible. Says even the Zyprexa  7.5 mg hasn't really helped, in the past, the aggressive and oppositional behaviors.

## 2024-01-01 NOTE — Telephone Encounter (Signed)
 Hi [name], please call his sister.  Yes he can be seen with the staff member on his next visit.  We have cut down his olanzapine  from 7.5 mg to 5 mg as gaining weight.  He can go back to 7.5 mg to help his agitation.  Will keep the same appointment unless olanzapine  7.5 mg did not help.

## 2024-01-01 NOTE — Telephone Encounter (Signed)
 Yes I can see him earlier but he should start taking 7.5 mg olanzapine .

## 2024-01-02 ENCOUNTER — Other Ambulatory Visit (HOSPITAL_COMMUNITY): Payer: Self-pay

## 2024-01-02 DIAGNOSIS — R440 Auditory hallucinations: Secondary | ICD-10-CM

## 2024-01-02 DIAGNOSIS — F71 Moderate intellectual disabilities: Secondary | ICD-10-CM

## 2024-01-02 MED ORDER — FLUOXETINE HCL 40 MG PO CAPS: 40.0000 mg | ORAL_CAPSULE | Freq: Every day | ORAL | 0 refills | Status: DC

## 2024-01-08 ENCOUNTER — Telehealth (HOSPITAL_COMMUNITY): Admitting: Psychiatry

## 2024-01-08 ENCOUNTER — Encounter (HOSPITAL_COMMUNITY): Payer: Self-pay | Admitting: Psychiatry

## 2024-01-08 VITALS — Wt 230.0 lb

## 2024-01-08 DIAGNOSIS — F71 Moderate intellectual disabilities: Secondary | ICD-10-CM | POA: Diagnosis not present

## 2024-01-08 DIAGNOSIS — R440 Auditory hallucinations: Secondary | ICD-10-CM

## 2024-01-08 DIAGNOSIS — R451 Restlessness and agitation: Secondary | ICD-10-CM | POA: Diagnosis not present

## 2024-01-08 MED ORDER — HYDROXYZINE HCL 10 MG PO TABS: 10.0000 mg | ORAL_TABLET | Freq: Two times a day (BID) | ORAL | 0 refills | Status: AC | PRN

## 2024-01-08 MED ORDER — FLUOXETINE HCL 40 MG PO CAPS: 40.0000 mg | ORAL_CAPSULE | Freq: Every day | ORAL | 1 refills | Status: DC

## 2024-01-08 NOTE — Progress Notes (Signed)
 [facility] MD Virtual Progress Note   Patient Location: Group Home Provider Location: Home Office  I connect with patient by video and verified that I am speaking with correct person by using two identifiers. I discussed the limitations of evaluation and management by telemedicine and the availability of in person appointments. I also discussed with the patient that there may be a patient responsible charge related to this service. The patient expressed understanding and agreed to proceed.  Arthur Weaver [id number] 63 y.o.  01/08/2024 3:22 PM  History of Present Illness:  Patient is evaluated by video session.  His sister is concerned about patient's behavior as last weekend he had an outburst at group home.  His sister called and we recommend to go back to olanzapine  7.5 mg at bedtime.  She just picked up medicine yesterday.  I also spoke to the staff [employer] who was present last weekend and witnessed the episode.  Apparently patient became very upset when he was told to wait for his turn.  He became very mean with the staff and talking to himself loudly.  Though staff was scared but able to redirect him.  Later he went outside for the fresh air and able to calm down.  Staff also reported that he is not sleeping very well at night and going to bed very late and wake up in the morning.  We have reduced olanzapine  from 7.5 mg to 5 mg to help the weight.  He had lost 10 pounds since the last visit but staff reported they also not allowing him to eat late at night.  They usually close the kitchen and pantry has only healthier snacks.  Patient denies any suicidal thoughts but reported restlessness.  He talk to himself.  He do not recall the incident last weekend.  He lost 10 pounds since the last visit.  His sister is concerned about the behavior.  He has no tremors or shakes.  His speech is fast and sometimes rambling.  He does watch television.  He lives with 6 other people in the  group home.  He stays to himself and does not cause his issues but staff reported sometime he bullies people around him.  However no aggression, violence.  Most of information was obtained from sister and the staff.  When asked about visual hallucination patient denies but noticed talking to himself.  Past Psychiatric History: History of hallucination. Saw Dr Pucolowski and Dr.Cobo during COVID having behavior problems in the group home. Given the Seroquel, trazodone , olanzapine  and Prozac . No history of suicidal attempt, inpatient.   Past Medical History:  Diagnosis Date   Anxiety    At risk for sleep apnea    STOP-BANG= 5       SENT TO PCP 04-14-2016   BPH (benign prostatic hyperplasia)    Cancer (HCC)    Vocal cords   DDD (degenerative disc disease), cervical    DDD (degenerative disc disease), lumbar    Depression    Diverticulosis of colon    Family history of factor V deficiency    per pt's sister (whom is pt's guardian) she, pt's brother and parents have factor V but pt has never been tested   Frequency of urination    GERD (gastroesophageal reflux disease)    History of adenomatous polyp of colon    2006 and 03-18-2010 tubular adenoma   Hyperlipidemia    Hypertension    Lives in independent group home resides at [facility]  Group Home in [place], [place]   pt independant w/ ADLs w/ exception does not cook for shop    Mentally challenged    SPECIAL NEEDS   OA (osteoarthritis)    hands, knees, feet   Osteopenia    Pre-diabetes    Psoriasis    per patient's sister   Thoracic spondylosis    Urgency of urination    Wears partial dentures    lower    Outpatient Encounter Medications as of 01/08/2024  Medication Sig   acetaminophen  (TYLENOL ) 325 MG tablet Take 650 mg by mouth every 4 (four) hours as needed for moderate pain or headache.   amLODipine (NORVASC) 5 MG tablet Take 5 mg by mouth daily.    aspirin 81 MG EC tablet take 1 tab qhs   Calcium  Carb-Cholecalciferol (CALCIUM+D3) 600-800 MG-UNIT TABS Take 1 tablet by mouth daily.   Cholecalciferol (VITAMIN D3) 1000 units CAPS Take 1,000 Units by mouth daily at 2 am.   famotidine (PEPCID) 40 MG tablet Take 1 tablet by mouth daily.   FLUoxetine  (PROZAC ) 40 MG capsule Take 1 capsule (40 mg total) by mouth daily.   fluticasone (FLONASE) 50 MCG/ACT nasal spray Place 1 spray into both nostrils daily.   levocetirizine (XYZAL) 5 MG tablet take 1 tab qhs once daily   lisinopril (ZESTRIL) 40 MG tablet Take 1 tablet by mouth daily.   meloxicam (MOBIC) 7.5 MG tablet Take 7.5 mg by mouth daily as needed. (Patient not taking: Reported on 11/23/2023)   Multiple Vitamin (MULTIVITAMIN PO) Take 1 tablet by mouth every evening.   OLANZapine  (ZYPREXA ) 15 MG tablet Take 0.5 tablets (7.5 mg total) by mouth at bedtime.   omeprazole (PRILOSEC OTC) 20 MG tablet Take 1 tablet by mouth daily.   rosuvastatin (CRESTOR) 5 MG tablet Take 5 mg by mouth daily.   tamsulosin (FLOMAX) 0.4 MG CAPS capsule 0.4 mg daily after breakfast.   triamcinolone  cream (KENALOG ) 0.1 % Apply 1 application topically 2 (two) times daily as needed. Use of rashes.   No facility-administered encounter medications on file as of 01/08/2024.    No results found for this or any previous visit (from the past 2160 hours).   Psychiatric Specialty Exam: Physical Exam  Review of Systems  Musculoskeletal:        Ankle swelling and deformity    Weight 230 lb (104.3 kg).There is no height or weight on file to calculate BMI.  General Appearance: Fairly Groomed  Eye Contact:  Fair  Speech:  Fast  Volume:  Decreased  Mood:  Euthymic  Affect:  Labile  Thought Process:  Descriptions of Associations: Loose  Orientation:  Full (Time, Place, and Person)  Thought Content:  Tangential  Suicidal Thoughts:  No  Homicidal Thoughts:  No  Memory:  Immediate;   Fair Recent;   Fair Remote;   Fair  Judgement:  Fair  Insight:  Shallow  Psychomotor  Activity:  Decreased  Concentration:  Concentration: Fair and Attention Span: Fair  Recall:  [employer] of Knowledge:  Fair  Language:  Fair  Akathisia:  No  Handed:  Right  AIMS (if indicated):     Assets:  Desire for Improvement Housing Social Support  ADL's:  Intact  Cognition:  Impaired,  Moderate  Sleep: Fair       11/28/2022    9:49 AM  Depression screen PHQ 2/9  Decreased Interest 1  Down, Depressed, Hopeless 1  PHQ - 2 Score 2  Altered sleeping 3  Tired, decreased energy 2  Change in appetite 2  Feeling bad or failure about yourself  0  Trouble concentrating 3  Moving slowly or fidgety/restless 1  Suicidal thoughts 0  PHQ-9 Score 13   Difficult doing work/chores Somewhat difficult     Data saved with a previous flowsheet row definition    Assessment/Plan: Auditory hallucinations - Plan: hydrOXYzine (ATARAX) 10 MG tablet, FLUoxetine  (PROZAC ) 40 MG capsule  Intellectual developmental disorder, moderate - Plan: hydrOXYzine (ATARAX) 10 MG tablet, FLUoxetine  (PROZAC ) 40 MG capsule  Agitation - Plan: hydrOXYzine (ATARAX) 10 MG tablet  Patient is 63 year old man with history of memory issues, auditory hallucination, intellectual developmental disorder.  Review messages from the sister and current medication.  He also had blood work and last hemoglobin A1c stable.  We have reduced the dose of olanzapine  to help the weight loss and patient had lost 10 pounds but started to have behavior problem.  We have recommended to go back to 7.5 and patient is going to start taking tonight.  I encourage to keep monitor the weight since we are going up on olanzapine .  His hemoglobin A1c was 6.4 but now dropped to 5.8.  His episode could be impulsive but sister is concerned as she noticed slowly and gradually he is getting irritable.  Recommend to trial low-dose hydroxyzine 10 mg up to twice a day as needed for agitation and keep the Prozac  40 mg daily.  Treatment plan discussed with the  patient, sister and staff.  Explained the hydroxyzine can cause sedation and the need to monitor after giving the medication.  Will follow-up in 6 weeks however they can call sooner if needed.  Follow Up Instructions:     I discussed the assessment and treatment plan with the patient. The patient was provided an opportunity to ask questions and all were answered. The patient agreed with the plan and demonstrated an understanding of the instructions.   The patient was advised to call back or seek an in-person evaluation if the symptoms worsen or if the condition fails to improve as anticipated.    Collaboration of Care: Other provider involved in patient's care AEB notes are available in epic to review  Patient/Guardian was advised Release of Information must be obtained prior to any record release in order to collaborate their care with an outside provider. Patient/Guardian was advised if they have not already done so to contact the registration department to sign all necessary forms in order for us  to release information regarding their care.   Consent: Patient/Guardian gives verbal consent for treatment and assignment of benefits for services provided during this visit. Patient/Guardian expressed understanding and agreed to proceed.     Total encounter time 26 minutes which includes face-to-face time, chart reviewed, care coordination, order entry and documentation during this encounter.   Note: This document was prepared by [employer] voice dictation technology and any errors that results from this process are unintentional.    Leni ONEIDA Client, MD 01/08/2024

## 2024-01-09 ENCOUNTER — Ambulatory Visit (HOSPITAL_COMMUNITY): Admitting: Psychiatry

## 2024-01-28 ENCOUNTER — Other Ambulatory Visit (HOSPITAL_COMMUNITY): Payer: Self-pay | Admitting: Psychiatry

## 2024-02-02 ENCOUNTER — Other Ambulatory Visit (HOSPITAL_COMMUNITY): Payer: Self-pay | Admitting: Psychiatry

## 2024-02-02 DIAGNOSIS — R440 Auditory hallucinations: Secondary | ICD-10-CM

## 2024-02-02 DIAGNOSIS — F71 Moderate intellectual disabilities: Secondary | ICD-10-CM

## 2024-02-02 DIAGNOSIS — R451 Restlessness and agitation: Secondary | ICD-10-CM

## 2024-02-05 ENCOUNTER — Ambulatory Visit: Admitting: Podiatry

## 2024-02-05 ENCOUNTER — Encounter: Payer: Self-pay | Admitting: Podiatry

## 2024-02-05 VITALS — Ht 69.0 in | Wt 230.0 lb

## 2024-02-05 DIAGNOSIS — M79676 Pain in unspecified toe(s): Secondary | ICD-10-CM

## 2024-02-05 DIAGNOSIS — M2010 Hallux valgus (acquired), unspecified foot: Secondary | ICD-10-CM

## 2024-02-05 DIAGNOSIS — B351 Tinea unguium: Secondary | ICD-10-CM | POA: Diagnosis not present

## 2024-02-05 DIAGNOSIS — E0851 Diabetes mellitus due to underlying condition with diabetic peripheral angiopathy without gangrene: Secondary | ICD-10-CM | POA: Diagnosis not present

## 2024-02-05 DIAGNOSIS — L84 Corns and callosities: Secondary | ICD-10-CM | POA: Diagnosis not present

## 2024-02-05 NOTE — Progress Notes (Signed)
 This patient returns to my office for at risk foot care.  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  This patient is unable to cut nails himself since the patient cannot reach his nails.These nails are painful walking and wearing shoes. Patient also has painful callus  on both feet since he is unable to self treat.   This patient presents for at risk foot care today.  General Appearance  Alert, conversant and in no acute stress.  Vascular  Dorsalis pedis and posterior tibial  pulses are not  palpable  bilaterally.  Capillary return is within normal limits  bilaterally. Temperature is within normal limits  bilaterally.  Neurologic  Senn-Weinstein monofilament wire test diminished   bilaterally. Muscle power within normal limits bilaterally.  Nails Thick disfigured discolored nails with subungual debris  from hallux to fifth toes bilaterally. No evidence of bacterial infection or drainage bilaterally.  Orthopedic  No limitations of motion  feet .  No crepitus or effusions noted.  No bony pathology or digital deformities noted. 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  Skin  normotropic skin  noted bilaterally.  No signs of infections or ulcers noted.   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  Callus left hallux.  Callus midfoot right.  Dermatitis right foot.    Porokeratosis B/L.  Onychomycosis  Consent was obtained for treatment procedures.   Debride callus with # 15 blade followed by dremel tool. Followed by dremel tool usage.  No infection or ulcer. Debride nails with nail nipper and dremel tool. Brace dispensed to patient.   Return office visit   10 weeks                  Told patient to return for periodic foot care and evaluation due to potential at risk complications.     Ruffin Cotton DPM

## 2024-02-15 ENCOUNTER — Ambulatory Visit (HOSPITAL_COMMUNITY): Admitting: Psychiatry

## 2024-02-19 ENCOUNTER — Telehealth (HOSPITAL_COMMUNITY): Admitting: Psychiatry

## 2024-02-19 ENCOUNTER — Encounter (HOSPITAL_COMMUNITY): Payer: Self-pay | Admitting: Psychiatry

## 2024-02-19 VITALS — Wt 230.0 lb

## 2024-02-19 DIAGNOSIS — R440 Auditory hallucinations: Secondary | ICD-10-CM | POA: Diagnosis not present

## 2024-02-19 DIAGNOSIS — F71 Moderate intellectual disabilities: Secondary | ICD-10-CM | POA: Diagnosis not present

## 2024-02-19 DIAGNOSIS — R451 Restlessness and agitation: Secondary | ICD-10-CM | POA: Diagnosis not present

## 2024-02-19 MED ORDER — OLANZAPINE 15 MG PO TABS: 7.5000 mg | ORAL_TABLET | Freq: Every day | ORAL | 2 refills | Status: AC

## 2024-02-19 MED ORDER — FLUOXETINE HCL 40 MG PO CAPS: 40.0000 mg | ORAL_CAPSULE | Freq: Every day | ORAL | 2 refills | Status: AC

## 2024-02-19 NOTE — Progress Notes (Signed)
 " [facility] MD Virtual Progress Note   Patient Location: Sisters home  Provider Location: Home Office  I connect with patient by telephone and verified that I am speaking with correct person by using two identifiers. I discussed the limitations of evaluation and management by telemedicine and the availability of in person appointments. I also discussed with the patient that there may be a patient responsible charge related to this service. The patient expressed understanding and agreed to proceed.  Arthur Weaver [id number] 64 y.o.  02/19/2024 10:46 AM  History of Present Illness:  Patient is evaluated by phone session.  He is at his sister's house who is taking him to the physical and blood work as he has appointment with PCP.  Sister reported things are better and he had not required hydroxyzine .  He is now taking olanzapine  7.5 mg along with Prozac .  Sister is very close with the group home and she had requested if patient need hydroxyzine  then she should be notified.  However so far she had not heard any concern or complaint from the group home about patient's behavior.  Patient talked briefly and he reported things are okay.  He does talk to himself.  He is sleeping better.  He is not gaining weight.  He had a good Christmas and spent time with his sister.  As per sister, patient is following the rules and regulation of the group home.  He whispers himself but no agitation and aggression and violence.  He has no tremors or shakes.  He has not taken hydroxyzine  since last visit.  Patient and his sister wants to keep the olanzapine  and Prozac .  Past Psychiatric History: History of hallucination. Saw Dr Pucolowski and Dr.Cobo during COVID having behavior problems in the group home. Given the Seroquel, trazodone , olanzapine  and Prozac . No history of suicidal attempt, inpatient.   Past Medical History:  Diagnosis Date   Anxiety    At risk for sleep apnea    STOP-BANG= 5        SENT TO PCP 04-14-2016   BPH (benign prostatic hyperplasia)    Cancer (HCC)    Vocal cords   DDD (degenerative disc disease), cervical    DDD (degenerative disc disease), lumbar    Depression    Diverticulosis of colon    Family history of factor V deficiency    per pt's sister (whom is pt's guardian) she, pt's brother and parents have factor V but pt has never been tested   Frequency of urination    GERD (gastroesophageal reflux disease)    History of adenomatous polyp of colon    2006 and 03-18-2010 tubular adenoma   Hyperlipidemia    Hypertension    Lives in independent group home resides at [facility] in [place], [place]   pt independant w/ ADLs w/ exception does not cook for shop    Mentally challenged    SPECIAL NEEDS   OA (osteoarthritis)    hands, knees, feet   Osteopenia    Pre-diabetes    Psoriasis    per patient's sister   Thoracic spondylosis    Urgency of urination    Wears partial dentures    lower    Outpatient Encounter Medications as of 02/19/2024  Medication Sig   acetaminophen  (TYLENOL ) 325 MG tablet Take 650 mg by mouth every 4 (four) hours as needed for moderate pain or headache.   amLODipine (NORVASC) 5 MG tablet Take 5 mg by mouth daily.  aspirin 81 MG EC tablet take 1 tab qhs   Calcium Carb-Cholecalciferol (CALCIUM+D3) 600-800 MG-UNIT TABS Take 1 tablet by mouth daily.   Cholecalciferol (VITAMIN D3) 1000 units CAPS Take 1,000 Units by mouth daily at 2 am.   famotidine (PEPCID) 40 MG tablet Take 1 tablet by mouth daily.   FLUoxetine  (PROZAC ) 40 MG capsule Take 1 capsule (40 mg total) by mouth daily.   fluticasone (FLONASE) 50 MCG/ACT nasal spray Place 1 spray into both nostrils daily.   hydrOXYzine  (ATARAX ) 10 MG tablet Take 1 tablet (10 mg total) by mouth 2 (two) times daily as needed for anxiety.   levocetirizine (XYZAL) 5 MG tablet take 1 tab qhs once daily   lisinopril (ZESTRIL) 40 MG tablet Take 1 tablet by mouth daily.    meloxicam (MOBIC) 7.5 MG tablet Take 7.5 mg by mouth daily as needed.   Multiple Vitamin (MULTIVITAMIN PO) Take 1 tablet by mouth every evening.   OLANZapine  (ZYPREXA ) 15 MG tablet Take 0.5 tablets (7.5 mg total) by mouth at bedtime.   omeprazole (PRILOSEC OTC) 20 MG tablet Take 1 tablet by mouth daily.   rosuvastatin (CRESTOR) 5 MG tablet Take 5 mg by mouth daily.   tamsulosin (FLOMAX) 0.4 MG CAPS capsule 0.4 mg daily after breakfast.   triamcinolone  cream (KENALOG ) 0.1 % Apply 1 application topically 2 (two) times daily as needed. Use of rashes.   No facility-administered encounter medications on file as of 02/19/2024.    No results found for this or any previous visit (from the past 2160 hours).   Psychiatric Specialty Exam: Physical Exam  Review of Systems  Weight 230 lb (104.3 kg).There is no height or weight on file to calculate BMI.  General Appearance: NA  Eye Contact:  NA  Speech:  Fast and rambling   Volume:  Decreased  Mood:  Euthymic  Affect:  NA  Thought Process:  Descriptions of Associations: Loose  Orientation:  Full (Time, Place, and Person)  Thought Content:  Tangential  Suicidal Thoughts:  No  Homicidal Thoughts:  No  Memory:  Immediate;   Fair Recent;   Fair Remote;   Fair  Judgement:  Fair  Insight:  Shallow  Psychomotor Activity:  Decreased  Concentration:  Concentration: Fair and Attention Span: Fair  Recall:  [employer] of Knowledge:  Fair  Language:  Fair  Akathisia:  No  Handed:  Right  AIMS (if indicated):     Assets:  Desire for Improvement Housing Social Support  ADL's:  Intact  Cognition:  Impaired,  Moderate  Sleep: better       11/28/2022    9:49 AM  Depression screen PHQ 2/9  Decreased Interest 1  Down, Depressed, Hopeless 1  PHQ - 2 Score 2  Altered sleeping 3  Tired, decreased energy 2  Change in appetite 2  Feeling bad or failure about yourself  0  Trouble concentrating 3  Moving slowly or fidgety/restless 1  Suicidal  thoughts 0  PHQ-9 Score 13   Difficult doing work/chores Somewhat difficult     Data saved with a previous flowsheet row definition    Assessment/Plan: Auditory hallucinations - Plan: OLANZapine  (ZYPREXA ) 15 MG tablet, FLUoxetine  (PROZAC ) 40 MG capsule  Intellectual developmental disorder, moderate - Plan: OLANZapine  (ZYPREXA ) 15 MG tablet, FLUoxetine  (PROZAC ) 40 MG capsule  Agitation - Plan: OLANZapine  (ZYPREXA ) 15 MG tablet  Patient is 64 year old man with history of memory issues, auditory hallucination, intellectual developmental disorder.  Patient is stable on olanzapine  7.5  mg.  We started him on low-dose hydroxyzine  to take as needed for agitation however has not required since the last visit.  His sister is pleased with the current regime.  Today he has a appointment for physical and blood work.  Continue olanzapine  7.5 mg at bedtime and Prozac  40 mg daily.  Recommend to call back if is any question or any concern.  Follow-up in 3 months.Follow Up Instructions:   Follow up instructions;  I discussed the assessment and treatment plan with the patient. The patient was provided an opportunity to ask questions and all were answered. The patient agreed with the plan and demonstrated an understanding of the instructions.   The patient was advised to call back or seek an in-person evaluation if the symptoms worsen or if the condition fails to improve as anticipated.    Collaboration of Care: Other provider involved in patient's care AEB notes are available in epic to review  Patient/Guardian was advised Release of Information must be obtained prior to any record release in order to collaborate their care with an outside provider. Patient/Guardian was advised if they have not already done so to contact the registration department to sign all necessary forms in order for us  to release information regarding their care.   Consent: Patient/Guardian gives verbal consent for treatment and  assignment of benefits for services provided during this visit. Patient/Guardian expressed understanding and agreed to proceed.     Total encounter time 19 minutes which includes face-to-face time, chart reviewed, care coordination, order entry and documentation during this encounter.   Note: This document was prepared by [employer] voice dictation technology and any errors that results from this process are unintentional.    Leni ONEIDA Client, MD 02/19/2024   "

## 2024-05-06 ENCOUNTER — Ambulatory Visit: Admitting: Podiatry

## 2024-05-23 ENCOUNTER — Telehealth (HOSPITAL_COMMUNITY): Admitting: Psychiatry

## 2024-06-26 ENCOUNTER — Ambulatory Visit: Admitting: Rheumatology
